# Patient Record
Sex: Female | Born: 1950 | Race: White | Hispanic: No | Marital: Single | Smoking: Never smoker
Health system: Southern US, Community
[De-identification: ages and names within clinical notes are randomized; demographics above are authoritative.]

## PROBLEM LIST (undated history)

## (undated) DIAGNOSIS — Z9981 Dependence on supplemental oxygen: Secondary | ICD-10-CM

## (undated) DIAGNOSIS — D649 Anemia, unspecified: Secondary | ICD-10-CM

## (undated) DIAGNOSIS — L03116 Cellulitis of left lower limb: Secondary | ICD-10-CM

## (undated) DIAGNOSIS — G4730 Sleep apnea, unspecified: Secondary | ICD-10-CM

## (undated) DIAGNOSIS — N189 Chronic kidney disease, unspecified: Secondary | ICD-10-CM

## (undated) DIAGNOSIS — I10 Essential (primary) hypertension: Secondary | ICD-10-CM

## (undated) DIAGNOSIS — M1990 Unspecified osteoarthritis, unspecified site: Secondary | ICD-10-CM

## (undated) DIAGNOSIS — E119 Type 2 diabetes mellitus without complications: Secondary | ICD-10-CM

## (undated) DIAGNOSIS — C4490 Unspecified malignant neoplasm of skin, unspecified: Secondary | ICD-10-CM

## (undated) DIAGNOSIS — T7840XA Allergy, unspecified, initial encounter: Secondary | ICD-10-CM

## (undated) DIAGNOSIS — I872 Venous insufficiency (chronic) (peripheral): Secondary | ICD-10-CM

## (undated) DIAGNOSIS — L02416 Cutaneous abscess of left lower limb: Secondary | ICD-10-CM

## (undated) HISTORY — DX: Cutaneous abscess of left lower limb: L02.416

## (undated) HISTORY — DX: Anemia, unspecified: D64.9

## (undated) HISTORY — DX: Cellulitis of left lower limb: L03.116

## (undated) HISTORY — PX: OTHER SURGICAL HISTORY: SHX169

## (undated) HISTORY — PX: NO PAST SURGERIES: SHX2092

---

## 2015-07-20 ENCOUNTER — Emergency Department: Payer: Medicare Other

## 2015-07-20 ENCOUNTER — Inpatient Hospital Stay
Admission: EM | Admit: 2015-07-20 | Discharge: 2015-07-27 | DRG: 641 | Disposition: A | Discharge status: Skilled Nursing Facility | Payer: Medicare Other | Attending: Internal Medicine | Admitting: Internal Medicine

## 2015-07-20 ENCOUNTER — Encounter: Payer: Self-pay | Admitting: Emergency Medicine

## 2015-07-20 DIAGNOSIS — E875 Hyperkalemia: Secondary | ICD-10-CM | POA: Diagnosis not present

## 2015-07-20 DIAGNOSIS — Z452 Encounter for adjustment and management of vascular access device: Secondary | ICD-10-CM

## 2015-07-20 DIAGNOSIS — M1990 Unspecified osteoarthritis, unspecified site: Secondary | ICD-10-CM | POA: Diagnosis present

## 2015-07-20 DIAGNOSIS — Z883 Allergy status to other anti-infective agents status: Secondary | ICD-10-CM

## 2015-07-20 DIAGNOSIS — R600 Localized edema: Secondary | ICD-10-CM | POA: Diagnosis present

## 2015-07-20 DIAGNOSIS — B354 Tinea corporis: Secondary | ICD-10-CM | POA: Diagnosis present

## 2015-07-20 DIAGNOSIS — L03116 Cellulitis of left lower limb: Secondary | ICD-10-CM | POA: Diagnosis present

## 2015-07-20 DIAGNOSIS — E1122 Type 2 diabetes mellitus with diabetic chronic kidney disease: Secondary | ICD-10-CM | POA: Diagnosis present

## 2015-07-20 DIAGNOSIS — R002 Palpitations: Secondary | ICD-10-CM | POA: Diagnosis not present

## 2015-07-20 DIAGNOSIS — R531 Weakness: Secondary | ICD-10-CM

## 2015-07-20 DIAGNOSIS — R079 Chest pain, unspecified: Secondary | ICD-10-CM | POA: Diagnosis not present

## 2015-07-20 DIAGNOSIS — Y92009 Unspecified place in unspecified non-institutional (private) residence as the place of occurrence of the external cause: Secondary | ICD-10-CM

## 2015-07-20 DIAGNOSIS — L03115 Cellulitis of right lower limb: Secondary | ICD-10-CM | POA: Diagnosis present

## 2015-07-20 DIAGNOSIS — Z7401 Bed confinement status: Secondary | ICD-10-CM

## 2015-07-20 DIAGNOSIS — Z6841 Body Mass Index (BMI) 40.0 and over, adult: Secondary | ICD-10-CM

## 2015-07-20 DIAGNOSIS — Z794 Long term (current) use of insulin: Secondary | ICD-10-CM

## 2015-07-20 DIAGNOSIS — T500X5A Adverse effect of mineralocorticoids and their antagonists, initial encounter: Secondary | ICD-10-CM | POA: Diagnosis present

## 2015-07-20 DIAGNOSIS — G8929 Other chronic pain: Secondary | ICD-10-CM | POA: Diagnosis present

## 2015-07-20 DIAGNOSIS — T464X5A Adverse effect of angiotensin-converting-enzyme inhibitors, initial encounter: Secondary | ICD-10-CM | POA: Diagnosis present

## 2015-07-20 DIAGNOSIS — Z885 Allergy status to narcotic agent status: Secondary | ICD-10-CM

## 2015-07-20 DIAGNOSIS — I272 Other secondary pulmonary hypertension: Secondary | ICD-10-CM | POA: Diagnosis present

## 2015-07-20 DIAGNOSIS — N183 Chronic kidney disease, stage 3 (moderate): Secondary | ICD-10-CM | POA: Diagnosis present

## 2015-07-20 DIAGNOSIS — R0602 Shortness of breath: Secondary | ICD-10-CM | POA: Diagnosis not present

## 2015-07-20 DIAGNOSIS — I10 Essential (primary) hypertension: Secondary | ICD-10-CM | POA: Diagnosis present

## 2015-07-20 DIAGNOSIS — I129 Hypertensive chronic kidney disease with stage 1 through stage 4 chronic kidney disease, or unspecified chronic kidney disease: Secondary | ICD-10-CM | POA: Diagnosis present

## 2015-07-20 HISTORY — DX: Essential (primary) hypertension: I10

## 2015-07-20 HISTORY — DX: Unspecified osteoarthritis, unspecified site: M19.90

## 2015-07-20 HISTORY — DX: Type 2 diabetes mellitus without complications: E11.9

## 2015-07-20 LAB — CBC WITH DIFFERENTIAL/PLATELET
Basophils Absolute: 0 10*3/uL (ref 0–0.1)
Basophils Relative: 1 %
Eosinophils Absolute: 0.2 10*3/uL (ref 0–0.7)
Eosinophils Relative: 2 %
HEMATOCRIT: 43.3 % (ref 35.0–47.0)
HEMOGLOBIN: 14 g/dL (ref 12.0–16.0)
LYMPHS ABS: 1 10*3/uL (ref 1.0–3.6)
Lymphocytes Relative: 13 %
MCH: 31.1 pg (ref 26.0–34.0)
MCHC: 32.3 g/dL (ref 32.0–36.0)
MCV: 96.3 fL (ref 80.0–100.0)
MONOS PCT: 7 %
Monocytes Absolute: 0.6 10*3/uL (ref 0.2–0.9)
NEUTROS ABS: 6 10*3/uL (ref 1.4–6.5)
NEUTROS PCT: 77 %
Platelets: 145 10*3/uL — ABNORMAL LOW (ref 150–440)
RBC: 4.5 MIL/uL (ref 3.80–5.20)
RDW: 14.4 % (ref 11.5–14.5)
WBC: 7.8 10*3/uL (ref 3.6–11.0)

## 2015-07-20 LAB — URINALYSIS COMPLETE WITH MICROSCOPIC (ARMC ONLY)
Bilirubin Urine: NEGATIVE
Glucose, UA: NEGATIVE mg/dL
HGB URINE DIPSTICK: NEGATIVE
Leukocytes, UA: NEGATIVE
NITRITE: NEGATIVE
PH: 5 (ref 5.0–8.0)
PROTEIN: 100 mg/dL — AB
Specific Gravity, Urine: 1.025 (ref 1.005–1.030)

## 2015-07-20 LAB — CK: Total CK: 108 U/L (ref 38–234)

## 2015-07-20 NOTE — ED Provider Notes (Signed)
[facility] Emergency Department Provider Note     ____________________________________________  Time seen: Seen upon arrival to the emergency department  I have reviewed the triage vital signs and the nursing notes.   HISTORY  Chief Complaint Weakness   HPI Gabrielle [name] is a 65 y.o. female with a history of arthritis as well as morbid obesity who is presenting for increased weakness. She says that ever since March she has been having episodes of weakness where she says that she feels like she cannot get up. She says that she has noticed increased edema in her bilateral lower extremities that is now going up to her abdomen. She says there is also erythema to her bilateral lower extremities which is now extending upward to the back of her bilateral thighs. She says that she has a lift recliner at home and usually gets around using a walker but has been unable to get out of the chair over the past week.She says that she has ongoing pain in her joints which is unchanged from her chronic arthritis pain. Does not report any fever. Does not report any burning with urination. Does not report any shortness of breath. Says that she lives alone and has not had her blood drawn since this past January. Denies any focal weakness. Says that she is also experienced paresthesias to different areas of her body including her back face and shoulders. Denies any paresthesias at this time.   Past Medical History  Diagnosis Date  . Arthritis   . Diabetes mellitus without complication ([place])   . Osteoarthritis   . Hypertension     There are no active problems to display for this patient.   History reviewed. No pertinent past surgical history.  Current Outpatient Rx  Name  Route  Sig  Dispense  Refill  . acetaminophen (TYLENOL) 500 MG tablet   Oral   Take 500 mg by mouth 3 (three) times daily.         . captopril (CAPOTEN) 50 MG tablet   Oral   Take 1 tablet by mouth 3  (three) times daily.         . cephALEXin (KEFLEX) 500 MG capsule   Oral   Take 1 capsule by mouth 2 (two) times daily.         . Cholecalciferol (VITAMIN D3) 1000 units CAPS   Oral   Take 2,000 Units by mouth daily.         . fluticasone (FLONASE) 50 MCG/ACT nasal spray   Each Nare   Place 1 spray into both nostrils at bedtime.         . insulin glargine (LANTUS) 100 UNIT/ML injection   Subcutaneous   Inject 70-80 Units into the skin daily.         . insulin lispro (HUMALOG) 100 UNIT/ML injection   Subcutaneous   Inject into the skin 3 (three) times daily with meals. Per sliding scale 1 units per 10 grams of carbs         . metoprolol succinate (TOPROL-XL) 100 MG 24 hr tablet   Oral   Take 1 tablet by mouth daily.         . nabumetone (RELAFEN) 500 MG tablet   Oral   Take 1 tablet by mouth 2 (two) times daily.         Marland Kitchen neomycin-bacitracin-polymyxin (NEOSPORIN) ointment   Topical   Apply 1 application topically as needed for wound care. apply to eye         .  nystatin (NYSTATIN) powder   Topical   Apply 1 g topically daily as needed.         Marland Kitchen spironolactone (ALDACTONE) 50 MG tablet   Oral   Take 1 tablet by mouth 2 (two) times daily.         . vitamin B-12 (CYANOCOBALAMIN) 1000 MCG tablet   Oral   Take 1,000 mcg by mouth daily.           Allergies Ultram; Erythromycin; and Tetracyclines & related  No family history on file.  Social History Social History  Substance Use Topics  . Smoking status: Never Smoker   . Smokeless tobacco: Never Used  . Alcohol Use: No    Review of Systems Constitutional: No fever/chills Eyes: No visual changes. ENT: No sore throat. Cardiovascular: Denies chest pain. Respiratory: Denies shortness of breath. Gastrointestinal: No abdominal pain.  No nausea, no vomiting.  No diarrhea.  No constipation. Genitourinary: Negative for dysuria. Musculoskeletal: Negative for back pain. Skin: Erythema to the  bilateral lower extremities. Neurological: Negative for headaches, focal weakness or numbness.  10-point ROS otherwise negative.  ____________________________________________   PHYSICAL EXAM:  VITAL SIGNS: ED Triage Vitals  Enc Vitals Group     BP 07/20/15 2204 173/82 mmHg     Pulse Rate 07/20/15 2204 79     Resp 07/20/15 2204 30     Temp 07/20/15 2207 98.1 F (36.7 C)     Temp Source 07/20/15 2207 Oral     SpO2 07/20/15 2204 92 %     Weight 07/20/15 2204 478 lb (216.819 kg)     Height 07/20/15 2204 5\' 6"  (1.676 m)     Head Cir --      Peak Flow --      Pain Score 07/20/15 2207 10     Pain Loc --      Pain Edu? --      Excl. in [place]? --     Constitutional: Alert and oriented. Morbidly obese. Eyes: Conjunctivae are normal. PERRL. EOMI. Head: Atraumatic. Nose: No congestion/rhinnorhea. Mouth/Throat: Mucous membranes are moist.  Oropharynx non-erythematous. Neck: No stridor.   Cardiovascular: Normal rate, regular rhythm. Grossly normal heart sounds.   Respiratory: Mildly tachypneic. Scant wheezing throughout. Gastrointestinal: Soft and nontender. Edema extending to the inferior abdomen. Musculoskeletal: Bilateral moderate edema to the lower extremities. There is erythema and stiffness to the skin consistent with chronic venous stasis extending from the bilateral feet up to the calves and the medial thighs. No tenderness to the thoracic or lumbar spines. Neurologic:  Normal speech and language. 4 out of 5 strength throughout. No saddle anesthesia. Skin:  See above musculoskeletal exam. Also with moist, slightly erythematous skin discoloration in the gluteal crest as well as under the right breast which is consistent with [place]. Psychiatric: Mood and affect are normal. Speech and behavior are normal.  ____________________________________________   LABS (all labs ordered are listed, but only abnormal results are displayed)  Labs Reviewed  CBC WITH DIFFERENTIAL/PLATELET -  Abnormal; Notable for the following:    Platelets 145 (*)    All other components within normal limits  URINALYSIS COMPLETEWITH MICROSCOPIC ([facility] ONLY) - Abnormal; Notable for the following:    Color, Urine YELLOW (*)    APPearance CLEAR (*)    Ketones, ur TRACE (*)    Protein, ur 100 (*)    Bacteria, UA RARE (*)    Squamous Epithelial / LPF 0-5 (*)    All other components within normal limits  CK  COMPREHENSIVE METABOLIC PANEL  TROPONIN I  TSH   ____________________________________________  EKG  ED ECG REPORT I, Doran Stabler, the attending physician, personally viewed and interpreted this ECG.   Date: 07/20/2015  EKG Time: 2204  Rate: 79  Rhythm: normal sinus rhythm  Axis: Normal  Intervals:Short PR interval.  ST&T Change: No ST segment elevation or depression. No abnormal T-wave inversions.  ____________________________________________  RADIOLOGY  DG Chest 1 View (Final result) Result time: 07/20/15 22:26:31   Final result by Rad Results In Interface (07/20/15 22:26:31)   Narrative:   CLINICAL DATA: Acute onset of worsening generalized weakness and immobility. Initial encounter.  EXAM: CHEST 1 VIEW  COMPARISON: None.  FINDINGS: The lungs are well-aerated. Vascular congestion is noted. Mildly increased interstitial markings may reflect minimal interstitial edema. There is no evidence of pleural effusion or pneumothorax.  The cardiomediastinal silhouette is mildly enlarged. No acute osseous abnormalities are seen.  IMPRESSION: Vascular congestion and mild cardiomegaly. Mildly increased interstitial markings may reflect minimal interstitial edema.   Electronically Signed By: Garald Balding M.D. On: 07/20/2015 22:26       ____________________________________________   PROCEDURES  ____________________________________________   INITIAL IMPRESSION / ASSESSMENT AND PLAN / ED COURSE  Pertinent labs & imaging results that were  available during my care of the patient were reviewed by me and considered in my medical decision making (see chart for details).  ----------------------------------------- 10:48 PM on 07/20/2015 -----------------------------------------  Family now at the bedside. Discussed case with family as well as workup.  ----------------------------------------- 11:58 PM on 07/20/2015 -----------------------------------------  Signed out to Dr. Beather Arbour. Pending lab work at this time. Patient will likely need admission versus placement. ____________________________________________   FINAL CLINICAL IMPRESSION(S) / ED DIAGNOSES  Weakness. Tinea corporis.    NEW MEDICATIONS STARTED DURING THIS VISIT:  New Prescriptions   No medications on file     Note:  This document was prepared using Dragon voice recognition software and may include unintentional dictation errors.    Orbie Pyo, MD 07/20/15 [phone]

## 2015-07-20 NOTE — ED Notes (Signed)
Pt in via EMS with complaints of episodes of increasing weakness and immobility since March.  Pt is wheelchair bound at home but was unable to transfer to wheelchair today.  Pt A/Ox4, vitals WDL, no immediate distress at this time.

## 2015-07-21 ENCOUNTER — Encounter: Payer: Self-pay | Admitting: Internal Medicine

## 2015-07-21 ENCOUNTER — Inpatient Hospital Stay: Payer: Medicare Other

## 2015-07-21 DIAGNOSIS — Z6841 Body Mass Index (BMI) 40.0 and over, adult: Secondary | ICD-10-CM | POA: Diagnosis not present

## 2015-07-21 DIAGNOSIS — I10 Essential (primary) hypertension: Secondary | ICD-10-CM | POA: Diagnosis present

## 2015-07-21 DIAGNOSIS — T500X5A Adverse effect of mineralocorticoids and their antagonists, initial encounter: Secondary | ICD-10-CM | POA: Diagnosis present

## 2015-07-21 DIAGNOSIS — R079 Chest pain, unspecified: Secondary | ICD-10-CM | POA: Diagnosis not present

## 2015-07-21 DIAGNOSIS — M1990 Unspecified osteoarthritis, unspecified site: Secondary | ICD-10-CM | POA: Diagnosis present

## 2015-07-21 DIAGNOSIS — R600 Localized edema: Secondary | ICD-10-CM | POA: Diagnosis present

## 2015-07-21 DIAGNOSIS — E875 Hyperkalemia: Secondary | ICD-10-CM | POA: Diagnosis present

## 2015-07-21 DIAGNOSIS — G8929 Other chronic pain: Secondary | ICD-10-CM | POA: Diagnosis present

## 2015-07-21 DIAGNOSIS — B354 Tinea corporis: Secondary | ICD-10-CM | POA: Diagnosis present

## 2015-07-21 DIAGNOSIS — R002 Palpitations: Secondary | ICD-10-CM | POA: Diagnosis not present

## 2015-07-21 DIAGNOSIS — Z885 Allergy status to narcotic agent status: Secondary | ICD-10-CM | POA: Diagnosis not present

## 2015-07-21 DIAGNOSIS — Z7401 Bed confinement status: Secondary | ICD-10-CM | POA: Diagnosis not present

## 2015-07-21 DIAGNOSIS — T464X5A Adverse effect of angiotensin-converting-enzyme inhibitors, initial encounter: Secondary | ICD-10-CM | POA: Diagnosis present

## 2015-07-21 DIAGNOSIS — R0602 Shortness of breath: Secondary | ICD-10-CM | POA: Diagnosis not present

## 2015-07-21 DIAGNOSIS — E1122 Type 2 diabetes mellitus with diabetic chronic kidney disease: Secondary | ICD-10-CM | POA: Diagnosis present

## 2015-07-21 DIAGNOSIS — Z883 Allergy status to other anti-infective agents status: Secondary | ICD-10-CM | POA: Diagnosis not present

## 2015-07-21 DIAGNOSIS — I129 Hypertensive chronic kidney disease with stage 1 through stage 4 chronic kidney disease, or unspecified chronic kidney disease: Secondary | ICD-10-CM | POA: Diagnosis present

## 2015-07-21 DIAGNOSIS — L03115 Cellulitis of right lower limb: Secondary | ICD-10-CM | POA: Diagnosis present

## 2015-07-21 DIAGNOSIS — N183 Chronic kidney disease, stage 3 (moderate): Secondary | ICD-10-CM | POA: Diagnosis present

## 2015-07-21 DIAGNOSIS — I272 Other secondary pulmonary hypertension: Secondary | ICD-10-CM | POA: Diagnosis present

## 2015-07-21 DIAGNOSIS — L03116 Cellulitis of left lower limb: Secondary | ICD-10-CM | POA: Diagnosis present

## 2015-07-21 DIAGNOSIS — Y92009 Unspecified place in unspecified non-institutional (private) residence as the place of occurrence of the external cause: Secondary | ICD-10-CM | POA: Diagnosis not present

## 2015-07-21 DIAGNOSIS — Z794 Long term (current) use of insulin: Secondary | ICD-10-CM | POA: Diagnosis not present

## 2015-07-21 LAB — CBC
HEMATOCRIT: 38.7 % (ref 35.0–47.0)
HEMOGLOBIN: 12.4 g/dL (ref 12.0–16.0)
MCH: 31.3 pg (ref 26.0–34.0)
MCHC: 31.9 g/dL — ABNORMAL LOW (ref 32.0–36.0)
MCV: 97.9 fL (ref 80.0–100.0)
Platelets: 140 10*3/uL — ABNORMAL LOW (ref 150–440)
RBC: 3.96 MIL/uL (ref 3.80–5.20)
RDW: 14.5 % (ref 11.5–14.5)
WBC: 9.1 10*3/uL (ref 3.6–11.0)

## 2015-07-21 LAB — PHOSPHORUS: Phosphorus: 4.2 mg/dL (ref 2.5–4.6)

## 2015-07-21 LAB — GLUCOSE, CAPILLARY
GLUCOSE-CAPILLARY: 157 mg/dL — AB (ref 65–99)
Glucose-Capillary: 108 mg/dL — ABNORMAL HIGH (ref 65–99)
Glucose-Capillary: 160 mg/dL — ABNORMAL HIGH (ref 65–99)
Glucose-Capillary: 91 mg/dL (ref 65–99)
Glucose-Capillary: 96 mg/dL (ref 65–99)

## 2015-07-21 LAB — COMPREHENSIVE METABOLIC PANEL
ALT: 18 U/L (ref 14–54)
ANION GAP: 5 (ref 5–15)
AST: 20 U/L (ref 15–41)
Albumin: 4.2 g/dL (ref 3.5–5.0)
Alkaline Phosphatase: 62 U/L (ref 38–126)
BUN: 26 mg/dL — ABNORMAL HIGH (ref 6–20)
CHLORIDE: 108 mmol/L (ref 101–111)
CO2: 24 mmol/L (ref 22–32)
CREATININE: 1.16 mg/dL — AB (ref 0.44–1.00)
Calcium: 9.4 mg/dL (ref 8.9–10.3)
GFR calc non Af Amer: 49 mL/min — ABNORMAL LOW (ref >60)
GFR, EST AFRICAN AMERICAN: 57 mL/min — AB (ref >60)
Glucose, Bld: 177 mg/dL — ABNORMAL HIGH (ref 65–99)
Potassium: >7.5 mmol/L (ref 3.5–5.1)
SODIUM: 137 mmol/L (ref 135–145)
Total Bilirubin: 1.3 mg/dL — ABNORMAL HIGH (ref 0.3–1.2)
Total Protein: 8 g/dL (ref 6.5–8.1)

## 2015-07-21 LAB — BASIC METABOLIC PANEL
ANION GAP: 3 — AB (ref 5–15)
Anion gap: 6 (ref 5–15)
BUN: 25 mg/dL — ABNORMAL HIGH (ref 6–20)
BUN: 22 mg/dL — AB (ref 6–20)
CALCIUM: 9 mg/dL (ref 8.9–10.3)
CALCIUM: 9.4 mg/dL (ref 8.9–10.3)
CHLORIDE: 111 mmol/L (ref 101–111)
CHLORIDE: 111 mmol/L (ref 101–111)
CO2: 26 mmol/L (ref 22–32)
CO2: 25 mmol/L (ref 22–32)
CREATININE: 1.13 mg/dL — AB (ref 0.44–1.00)
Creatinine, Ser: 1.14 mg/dL — ABNORMAL HIGH (ref 0.44–1.00)
GFR calc Af Amer: 59 mL/min — ABNORMAL LOW (ref >60)
GFR calc non Af Amer: 50 mL/min — ABNORMAL LOW (ref >60)
GFR calc non Af Amer: 51 mL/min — ABNORMAL LOW (ref >60)
GFR, EST AFRICAN AMERICAN: 58 mL/min — AB (ref >60)
GLUCOSE: 108 mg/dL — AB (ref 65–99)
Glucose, Bld: 108 mg/dL — ABNORMAL HIGH (ref 65–99)
Potassium: 7.4 mmol/L (ref 3.5–5.1)
Potassium: 7.2 mmol/L (ref 3.5–5.1)
Sodium: 140 mmol/L (ref 135–145)
Sodium: 142 mmol/L (ref 135–145)

## 2015-07-21 LAB — TSH: TSH: 6.592 u[IU]/mL — AB (ref 0.350–4.500)

## 2015-07-21 LAB — POTASSIUM: POTASSIUM: 7.5 mmol/L — AB (ref 3.5–5.1)

## 2015-07-21 LAB — TROPONIN I: Troponin I: <0.03 ng/mL (ref <0.031)

## 2015-07-21 MED ORDER — SODIUM CHLORIDE 0.9 % IV SOLN: 100.0000 mL | INTRAVENOUS | Status: DC | PRN

## 2015-07-21 MED ORDER — HYDRALAZINE HCL 20 MG/ML IJ SOLN
10.0000 mg | INTRAMUSCULAR | Status: DC | PRN
  Administered 2015-07-26: 10 mg via INTRAVENOUS
  Filled 2015-07-21: qty 1

## 2015-07-21 MED ORDER — VITAMIN B-12 1000 MCG PO TABS
1000.0000 ug | ORAL_TABLET | Freq: Every day | ORAL | Status: DC
  Administered 2015-07-21 – 2015-07-27 (×7): 1000 ug via ORAL
  Filled 2015-07-21 (×7): qty 1

## 2015-07-21 MED ORDER — LIDOCAINE-PRILOCAINE 2.5-2.5 % EX CREA
1.0000 "application " | TOPICAL_CREAM | CUTANEOUS | Status: DC | PRN
  Filled 2015-07-21: qty 5

## 2015-07-21 MED ORDER — HEPARIN SODIUM (PORCINE) 1000 UNIT/ML DIALYSIS
1000.0000 [IU] | INTRAMUSCULAR | Status: DC | PRN
  Filled 2015-07-21: qty 1

## 2015-07-21 MED ORDER — INSULIN GLARGINE 100 UNIT/ML ~~LOC~~ SOLN
55.0000 [IU] | Freq: Every day | SUBCUTANEOUS | Status: DC
  Filled 2015-07-21 (×3): qty 0.55

## 2015-07-21 MED ORDER — CALCIUM GLUCONATE 10 % IV SOLN
INTRAVENOUS | Status: AC
  Filled 2015-07-21: qty 10

## 2015-07-21 MED ORDER — PATIROMER SORBITEX CALCIUM 8.4 G PO PACK
25.2000 g | PACK | Freq: Every day | ORAL | Status: DC
  Administered 2015-07-21 – 2015-07-23 (×2): 25.2 g via ORAL
  Filled 2015-07-21 (×4): qty 4

## 2015-07-21 MED ORDER — INSULIN GLARGINE 100 UNIT/ML ~~LOC~~ SOLN: 70.0000 [IU] | Freq: Every day | SUBCUTANEOUS | Status: DC

## 2015-07-21 MED ORDER — SODIUM CHLORIDE 0.9% FLUSH: 3.0000 mL | INTRAVENOUS | Status: DC | PRN

## 2015-07-21 MED ORDER — SODIUM CHLORIDE 0.9% FLUSH
3.0000 mL | Freq: Two times a day (BID) | INTRAVENOUS | Status: DC
  Administered 2015-07-21 – 2015-07-26 (×12): 3 mL via INTRAVENOUS

## 2015-07-21 MED ORDER — ENOXAPARIN SODIUM 40 MG/0.4ML ~~LOC~~ SOLN
40.0000 mg | Freq: Two times a day (BID) | SUBCUTANEOUS | Status: DC
  Administered 2015-07-22 – 2015-07-27 (×11): 40 mg via SUBCUTANEOUS
  Filled 2015-07-21 (×12): qty 0.4

## 2015-07-21 MED ORDER — SODIUM POLYSTYRENE SULFONATE 15 GM/60ML PO SUSP
30.0000 g | Freq: Once | ORAL | Status: AC
  Administered 2015-07-21: 30 g via ORAL
  Filled 2015-07-21: qty 120

## 2015-07-21 MED ORDER — CALCIUM GLUCONATE 10 % IV SOLN
1.0000 g | Freq: Once | INTRAVENOUS | Status: AC
  Administered 2015-07-21: 1 g via INTRAVENOUS
  Filled 2015-07-21: qty 10

## 2015-07-21 MED ORDER — METOPROLOL SUCCINATE ER 100 MG PO TB24
100.0000 mg | ORAL_TABLET | Freq: Every day | ORAL | Status: DC
  Administered 2015-07-21 – 2015-07-27 (×6): 100 mg via ORAL
  Filled 2015-07-21 (×7): qty 1

## 2015-07-21 MED ORDER — INSULIN GLARGINE 100 UNIT/ML ~~LOC~~ SOLN
70.0000 [IU] | Freq: Every day | SUBCUTANEOUS | Status: DC
  Administered 2015-07-21: 70 [IU] via SUBCUTANEOUS
  Filled 2015-07-21: qty 0.7

## 2015-07-21 MED ORDER — PENTAFLUOROPROP-TETRAFLUOROETH EX AERO
1.0000 "application " | INHALATION_SPRAY | CUTANEOUS | Status: DC | PRN
  Filled 2015-07-21: qty 30

## 2015-07-21 MED ORDER — BACITRACIN-NEOMYCIN-POLYMYXIN 400-5-5000 EX OINT: 1.0000 "application " | TOPICAL_OINTMENT | CUTANEOUS | Status: DC | PRN

## 2015-07-21 MED ORDER — ONDANSETRON HCL 4 MG PO TABS: 4.0000 mg | ORAL_TABLET | Freq: Four times a day (QID) | ORAL | Status: DC | PRN

## 2015-07-21 MED ORDER — ALTEPLASE 2 MG IJ SOLR: 2.0000 mg | Freq: Once | INTRAMUSCULAR | Status: DC | PRN

## 2015-07-21 MED ORDER — ACETAMINOPHEN 325 MG PO TABS
650.0000 mg | ORAL_TABLET | Freq: Four times a day (QID) | ORAL | Status: DC | PRN
  Administered 2015-07-23 (×3): 650 mg via ORAL
  Filled 2015-07-21 (×3): qty 2

## 2015-07-21 MED ORDER — FLUTICASONE PROPIONATE 50 MCG/ACT NA SUSP
1.0000 | Freq: Every day | NASAL | Status: DC
  Administered 2015-07-22 (×2): 1 via NASAL
  Filled 2015-07-21: qty 16

## 2015-07-21 MED ORDER — SODIUM CHLORIDE 0.9 % IV SOLN: 250.0000 mL | INTRAVENOUS | Status: DC | PRN

## 2015-07-21 MED ORDER — SODIUM BICARBONATE 8.4 % IV SOLN
50.0000 meq | Freq: Once | INTRAVENOUS | Status: AC
  Administered 2015-07-21: 50 meq via INTRAVENOUS
  Filled 2015-07-21: qty 50

## 2015-07-21 MED ORDER — ONDANSETRON HCL 4 MG/2ML IJ SOLN: 4.0000 mg | Freq: Four times a day (QID) | INTRAMUSCULAR | Status: DC | PRN

## 2015-07-21 MED ORDER — ENOXAPARIN SODIUM 40 MG/0.4ML ~~LOC~~ SOLN: 40.0000 mg | SUBCUTANEOUS | Status: DC

## 2015-07-21 MED ORDER — SODIUM CHLORIDE 0.9% FLUSH
3.0000 mL | Freq: Two times a day (BID) | INTRAVENOUS | Status: DC
  Administered 2015-07-21 – 2015-07-25 (×5): 3 mL via INTRAVENOUS

## 2015-07-21 MED ORDER — INSULIN ASPART 100 UNIT/ML ~~LOC~~ SOLN
0.0000 [IU] | Freq: Three times a day (TID) | SUBCUTANEOUS | Status: DC
  Administered 2015-07-23 (×2): 4 [IU] via SUBCUTANEOUS
  Administered 2015-07-23 – 2015-07-24 (×2): 3 [IU] via SUBCUTANEOUS
  Administered 2015-07-24: 4 [IU] via SUBCUTANEOUS
  Administered 2015-07-25: 2 [IU] via SUBCUTANEOUS
  Administered 2015-07-25: 4 [IU] via SUBCUTANEOUS
  Administered 2015-07-26: 3 [IU] via SUBCUTANEOUS
  Administered 2015-07-26 – 2015-07-27 (×3): 4 [IU] via SUBCUTANEOUS
  Administered 2015-07-27: 7 [IU] via SUBCUTANEOUS
  Administered 2015-07-27: 4 [IU] via SUBCUTANEOUS
  Filled 2015-07-21 (×3): qty 4
  Filled 2015-07-21: qty 3
  Filled 2015-07-21: qty 4
  Filled 2015-07-21: qty 3
  Filled 2015-07-21 (×3): qty 4
  Filled 2015-07-21: qty 3
  Filled 2015-07-21 (×3): qty 4
  Filled 2015-07-21: qty 3
  Filled 2015-07-21: qty 7

## 2015-07-21 MED ORDER — VITAMIN D 1000 UNITS PO TABS
2000.0000 [IU] | ORAL_TABLET | Freq: Every day | ORAL | Status: DC
  Administered 2015-07-21 – 2015-07-27 (×7): 2000 [IU] via ORAL
  Filled 2015-07-21 (×7): qty 2

## 2015-07-21 MED ORDER — INSULIN ASPART 100 UNIT/ML ~~LOC~~ SOLN
10.0000 [IU] | Freq: Once | SUBCUTANEOUS | Status: AC
  Administered 2015-07-21: 10 [IU] via SUBCUTANEOUS
  Filled 2015-07-21: qty 10

## 2015-07-21 MED ORDER — ACETAMINOPHEN 650 MG RE SUPP: 650.0000 mg | Freq: Four times a day (QID) | RECTAL | Status: DC | PRN

## 2015-07-21 MED ORDER — INSULIN ASPART 100 UNIT/ML ~~LOC~~ SOLN: 0.0000 [IU] | Freq: Every day | SUBCUTANEOUS | Status: DC

## 2015-07-21 MED ORDER — DEXTROSE 50 % IV SOLN
1.0000 | Freq: Once | INTRAVENOUS | Status: AC
  Administered 2015-07-21: 50 mL via INTRAVENOUS
  Filled 2015-07-21 (×2): qty 50

## 2015-07-21 MED ORDER — LIDOCAINE HCL (PF) 1 % IJ SOLN
5.0000 mL | INTRAMUSCULAR | Status: DC | PRN
  Filled 2015-07-21: qty 5

## 2015-07-21 MED ORDER — SODIUM CHLORIDE 0.9 % IV SOLN
1.0000 g | Freq: Once | INTRAVENOUS | Status: AC
  Administered 2015-07-21: 1 g via INTRAVENOUS

## 2015-07-21 NOTE — ED Notes (Signed)
Pt changed and cleaned after BM with the help of Sarah, EDT and this RN. Pt position readjusted after cleaning. Pt verbalized position more comfortable.

## 2015-07-21 NOTE — Progress Notes (Signed)
Post hd tx 

## 2015-07-21 NOTE — Progress Notes (Signed)
Vascular team at bedside to place  hemodialysis catheter

## 2015-07-21 NOTE — ED Notes (Signed)
Pharmacy called to send dextrose 50% due to not in pixis

## 2015-07-21 NOTE — Progress Notes (Signed)
Hemodialysis complted.

## 2015-07-21 NOTE — Op Note (Signed)
  OPERATIVE NOTE   PROCEDURE: 1. Insertion of temporary dialysis catheter catheter right IJ approach.  PRE-OPERATIVE DIAGNOSIS: Malignant hyperkalemia  POST-OPERATIVE DIAGNOSIS: Same  SURGEON: Katha Cabal M.D.  ANESTHESIA: 1% lidocaine local infiltration  ESTIMATED BLOOD LOSS: Minimal cc  INDICATIONS:   Khalis Lasane is a 65 y.o. female who presents with found hyperkalemia that has been unresponsive to medical interventions she therefore requires emergent dialysis to prevent lethal arrhythmia.  DESCRIPTION: After obtaining full informed written consent, the patient was positioned supine. The right neck was prepped and draped in a sterile fashion. Ultrasound was placed in a sterile sleeve. Ultrasound was utilized to identify the right internal jugular vein which is noted to be echolucent and compressible indicating patency. Images recorded for the permanent record. Under real-time visualization a Seldinger needle is inserted into the vein and the guidewires advanced without difficulty. Small counterincision was made at the wire insertion site. Dilator is passed over the wire and the temporary dialysis catheter catheter is fed over the wire without difficulty.  All lumens aspirate and flush easily and are packed with heparin saline. Catheter secured to the skin of the right neck with 2-0 silk. A sterile dressing is applied with Biopatch.  COMPLICATIONS: None  CONDITION: Good  Katha Cabal, M.D. [place] [facility]. Office:  [phone]

## 2015-07-21 NOTE — Care Management (Addendum)
Patient presents from home  for weakness.  At baseline, patient lives alone and is mostly confined to motorized wheelchair.  her son who lives in [place] has made sure patient's home has been adapted to meet patient's needs.  She is morbidly obese, has used powered wheelchair for the past 13 years, has "every piece of equipment and grab bars imaginable" to allow patient to stay in her own home.  She has medic alert.  Patient has not left her home but twice in the last 4 years.  She has family and friends that come to visit her in the home.  Her sister delivers her groceries. patient is not able to "really cook" due to pain in shoulders so her meals consist of things that are very easy to fix. She does have chronic pain issues not only in shoulder but in her back and knees.  Is followed by Alvester Chou in the home but Almyra Free is having some health issues and has not seen patient since January.  At baseline, patient is able to transfer on her own but she has a marked increase in weakness over the last 3 days.  Admitted with potassium of > 7.  Nephrology consult is pending.  Patient is agreeable to consider short term skilled nursing placement if it is recommended because she must be able to stand and povit on her own.  She is also agreeable to home health.  Patient is at baseline needing assist with her personal care at home.  She verbalizes understanding that her medicare does not cover this service.  Provided list of agencies providing in home care.  Reaching out to patient's pcp to determine if there is going to be a need to find another physician to make home visits.  Provided contact information for [facility] - if finds needs another physician.  Patient will require physical therapy and occupational consults when medically stable

## 2015-07-21 NOTE — Progress Notes (Signed)
Critical lab result, potassium 7.2, MD aware, bariatric bed order placed as per md order.

## 2015-07-21 NOTE — ED Notes (Signed)
Hospitalist at bedside 

## 2015-07-21 NOTE — Consult Note (Signed)
[facility] CONSULT NOTE    Date: 07/21/2015                  Patient Name:  Miriya Ruggiero  MRN: [id number]  DOB: 1950-08-29  Age / Sex: 65 y.o., female         PCP: Alvester Chou, NP                 Service Requesting Consult: Dr. Earleen Newport                 Reason for Consult: Severe hyperkalemia            History of Present Illness: Patient is a 65 y.o. female with a PMHx of osteoarthritis, diabetes mellitus type 2, hypertension, severe morbid obesity, who was admitted to [facility] on 07/20/2015 for evaluation of weakness.  The patient has been home bound for approximately 2 years secondary to her morbid obesity and severe lower extremity edema.  However she is able to transfer out of the bedand able to ambulate with a walker.  When she presented here she had a potassium greater than 7.5.  She is followed by a provider that sees her in her home.  She reports that she last had blood workin January.  At that point in time she states that potassium was slightly high.  She is been given Kayexalate x2 and has also been started on patiromer. Despite this potassium is only down to 7.2 today.  She is noted to be on captopril as well as spironolactone at home.    Medications: Outpatient medications: Prescriptions prior to admission  Medication Sig Dispense Refill Last Dose  . acetaminophen (TYLENOL) 500 MG tablet Take 500 mg by mouth 3 (three) times daily.   07/19/2015 at Unknown time  . captopril (CAPOTEN) 50 MG tablet Take 1 tablet by mouth 3 (three) times daily.   07/20/2015 at Unknown time  . cephALEXin (KEFLEX) 500 MG capsule Take 1 capsule by mouth 2 (two) times daily.   07/19/2015 at Unknown time  . Cholecalciferol (VITAMIN D3) 1000 units CAPS Take 2,000 Units by mouth daily.   07/19/2015 at Unknown time  . fluticasone (FLONASE) 50 MCG/ACT nasal spray Place 1 spray into both nostrils at bedtime.   07/19/2015 at Unknown time  . insulin glargine (LANTUS) 100 UNIT/ML injection  Inject 70-80 Units into the skin daily.   07/20/2015 at Unknown time  . insulin lispro (HUMALOG) 100 UNIT/ML injection Inject into the skin 3 (three) times daily with meals. Per sliding scale 1 units per 10 grams of carbs   07/20/2015 at Unknown time  . metoprolol succinate (TOPROL-XL) 100 MG 24 hr tablet Take 1 tablet by mouth daily.   07/19/2015 at Unknown time  . nabumetone (RELAFEN) 500 MG tablet Take 1 tablet by mouth 2 (two) times daily.   07/19/2015 at Unknown time  . neomycin-bacitracin-polymyxin (NEOSPORIN) ointment Apply 1 application topically as needed for wound care. apply to eye   prn at prn  . nystatin (NYSTATIN) powder Apply 1 g topically daily as needed.   prn at prn  . spironolactone (ALDACTONE) 50 MG tablet Take 1 tablet by mouth 2 (two) times daily.   07/20/2015 at Unknown time  . vitamin B-12 (CYANOCOBALAMIN) 1000 MCG tablet Take 1,000 mcg by mouth daily.   07/19/2015 at Unknown time    Current medications: Current Facility-Administered Medications  Medication Dose Route Frequency Provider Last Rate Last Dose  . 0.9 %  sodium chloride infusion  250 mL Intravenous PRN Saundra Shelling, MD      . acetaminophen (TYLENOL) tablet 650 mg  650 mg Oral Q6H PRN Saundra Shelling, MD       Or  . acetaminophen (TYLENOL) suppository 650 mg  650 mg Rectal Q6H PRN Saundra Shelling, MD      . cholecalciferol (VITAMIN D) tablet 2,000 Units  2,000 Units Oral Daily Saundra Shelling, MD   2,000 Units at 07/21/15 1005  . enoxaparin (LOVENOX) injection 40 mg  40 mg Subcutaneous Q12H Crystal G Scarpena, RPH      . fluticasone (FLONASE) 50 MCG/ACT nasal spray 1 spray  1 spray Each Nare QHS Pavan Pyreddy, MD      . hydrALAZINE (APRESOLINE) injection 10 mg  10 mg Intravenous Q4H PRN Pavan Pyreddy, MD      . insulin aspart (novoLOG) injection 0-20 Units  0-20 Units Subcutaneous TID WC Saundra Shelling, MD   0 Units at 07/21/15 0818  . insulin aspart (novoLOG) injection 0-5 Units  0-5 Units Subcutaneous QHS Saundra Shelling, MD      . Derrill Memo ON 07/22/2015] insulin glargine (LANTUS) injection 55 Units  55 Units Subcutaneous Daily Loletha Grayer, MD      . metoprolol succinate (TOPROL-XL) 24 hr tablet 100 mg  100 mg Oral Daily Pavan Pyreddy, MD   100 mg at 07/21/15 1005  . neomycin-bacitracin-polymyxin (NEOSPORIN) ointment 1 application  1 application Topical PRN Pavan Pyreddy, MD      . ondansetron (ZOFRAN) tablet 4 mg  4 mg Oral Q6H PRN Saundra Shelling, MD       Or  . ondansetron (ZOFRAN) injection 4 mg  4 mg Intravenous Q6H PRN Saundra Shelling, MD      . patiromer Daryll Drown) packet 25.2 g  25.2 g Oral Daily Duriel Deery, MD   25.2 g at 07/21/15 1115  . sodium chloride flush (NS) 0.9 % injection 3 mL  3 mL Intravenous Q12H Pavan Pyreddy, MD   3 mL at 07/21/15 1006  . sodium chloride flush (NS) 0.9 % injection 3 mL  3 mL Intravenous Q12H Pavan Pyreddy, MD   3 mL at 07/21/15 1006  . sodium chloride flush (NS) 0.9 % injection 3 mL  3 mL Intravenous PRN Pavan Pyreddy, MD      . vitamin B-12 (CYANOCOBALAMIN) tablet 1,000 mcg  1,000 mcg Oral Daily Saundra Shelling, MD   1,000 mcg at 07/21/15 1005      Allergies: Allergies  Allergen Reactions  . Ultram [Tramadol] Other (See Comments)    Pt reports, "makes my head feel funny."  . Erythromycin Rash  . Tetracyclines & Related Rash      Past Medical History: Past Medical History  Diagnosis Date  . Arthritis   . Diabetes mellitus without complication ([place])   . Osteoarthritis   . Hypertension      Past Surgical History: Past Surgical History  Procedure Laterality Date  . None       Family History: Family History  Problem Relation Age of Onset  . Diabetes Mellitus II Father      Social History: Social History   Social History  . Marital Status: Single    Spouse Name: N/A  . Number of Children: N/A  . Years of Education: N/A   Occupational History  . disabled    Social History Main Topics  . Smoking status: Never Smoker   . Smokeless  tobacco: Never Used  . Alcohol Use: No  . Drug Use: No  .  Sexual Activity: No   Other Topics Concern  . Not on file   Social History Narrative     Review of Systems: Review of Systems  Constitutional: Positive for malaise/fatigue. Negative for fever, chills and weight loss.  HENT: Negative for hearing loss and tinnitus.   Eyes: Negative for blurred vision, double vision and photophobia.  Respiratory: Negative for cough, hemoptysis and sputum production.   Cardiovascular: Positive for leg swelling. Negative for chest pain, palpitations and orthopnea.  Gastrointestinal: Negative for heartburn, nausea, vomiting and diarrhea.  Genitourinary: Negative for dysuria and urgency.  Musculoskeletal: Negative for myalgias and neck pain.  Neurological: Positive for weakness. Negative for dizziness, focal weakness and headaches.  Endo/Heme/Allergies: Negative for polydipsia. Does not bruise/bleed easily.  Psychiatric/Behavioral: Negative for depression and hallucinations.     Vital Signs: Blood pressure 112/95, pulse 81, temperature 98.5 F (36.9 C), temperature source Oral, resp. rate 20, height 5\' 6"  (1.676 m), weight 216.819 kg (478 lb), SpO2 98 %.  Weight trends: Filed Weights   07/20/15 2204  Weight: 216.819 kg (478 lb)    Physical Exam: General: Morbidly obese female, NAD  Head: Normocephalic, atraumatic.  Eyes: Anicteric, EOMI  Nose: Mucous membranes moist, not inflammed, nonerythematous.  Throat: Oropharynx nonerythematous, no exudate appreciated.   Neck: Supple, trachea midline.  Lungs:  Normal respiratory effort. Clear to auscultation BL without crackles or wheezes.  Heart: RRR. S1 and S2 normal without gallop, murmur, or rubs.  Abdomen:  Large pannus noted, BS present, no rebound  Extremities: 3+ b/l LE edema, b/l LE cellulitis noted  Neurologic: A&O X3, Motor strength is 5/5 in the all 4 extremities  Skin: No visible rashes, scars.    Lab results: Basic Metabolic  Panel:  Recent Labs Lab 07/20/15 2256 07/21/15 0543 07/21/15 1225  NA 137 140 142  K >7.5* 7.4* 7.2*  CL 108 111 111  CO2 24 26 25   GLUCOSE 177* 108* 108*  BUN 26* 25* 22*  CREATININE 1.16* 1.14* 1.13*  CALCIUM 9.4 9.0 9.4    Liver Function Tests:  Recent Labs Lab 07/20/15 2256  AST 20  ALT 18  ALKPHOS 62  BILITOT 1.3*  PROT 8.0  ALBUMIN 4.2   No results for input(s): LIPASE, AMYLASE in the last 168 hours. No results for input(s): AMMONIA in the last 168 hours.  CBC:  Recent Labs Lab 07/20/15 2256 07/21/15 0543  WBC 7.8 9.1  NEUTROABS 6.0  --   HGB 14.0 12.4  HCT 43.3 38.7  MCV 96.3 97.9  PLT 145* 140*    Cardiac Enzymes:  Recent Labs Lab 07/20/15 2256  CKTOTAL 108  TROPONINI <0.03    BNP: Invalid input(s): POCBNP  CBG:  Recent Labs Lab 07/21/15 0051 07/21/15 0359 07/21/15 0744 07/21/15 1123  GLUCAP 160* 157* 96 108*    Microbiology: No results found for this or any previous visit.  Coagulation Studies: No results for input(s): LABPROT, INR in the last 72 hours.  Urinalysis:  Recent Labs  07/20/15 2244  COLORURINE YELLOW*  LABSPEC 1.025  PHURINE 5.0  GLUCOSEU NEGATIVE  HGBUR NEGATIVE  BILIRUBINUR NEGATIVE  KETONESUR TRACE*  PROTEINUR 100*  NITRITE NEGATIVE  LEUKOCYTESUR NEGATIVE      Imaging: Dg Chest 1 View  07/20/2015  CLINICAL DATA:  Acute onset of worsening generalized weakness and immobility. Initial encounter. EXAM: CHEST 1 VIEW COMPARISON:  None. FINDINGS: The lungs are well-aerated. Vascular congestion is noted. Mildly increased interstitial markings may reflect minimal interstitial edema. There is no evidence  of pleural effusion or pneumothorax. The cardiomediastinal silhouette is mildly enlarged. No acute osseous abnormalities are seen. IMPRESSION: Vascular congestion and mild cardiomegaly. Mildly increased interstitial markings may reflect minimal interstitial edema. Electronically Signed   By: Garald Balding  M.D.   On: 07/20/2015 22:26      Assessment & Plan: Pt is a 65 y.o. female with a PMHx of osteoarthritis, diabetes mellitus type 2, hypertension, severe morbid obesity, who was admitted to [facility] on 07/20/2015 for evaluation of weakness.   1.  Severe hyperkalemia secondary to spironolactone and captopril. 2.  Bilateral lower extremity edema with cellulitis. 3.  Hypertension.  Plan: The patient presents with severe hyperkalemia.  This is most likely attributable toconcomitant use of captopril and spironolactone.  Hyperkalemia is likely chronic in nature however.  Given severity of hyperkalemia and only minimal response totherapy and we will proceed with a short course of renal replacement therapy. We will continue the patient on patiromer at maximum dosage for now.  Agree with discontinuation of captopril as well as spironolactone.  Further plan as patient progresses.

## 2015-07-21 NOTE — Progress Notes (Signed)
Pre-hd tx 

## 2015-07-21 NOTE — Progress Notes (Signed)
Hemodialysis tx start 

## 2015-07-21 NOTE — Progress Notes (Signed)
Critical lab result potassium 7.4, Dr Leslye Peer notified.

## 2015-07-21 NOTE — ED Notes (Signed)
Pt called out stating she had a BM. Pt turned, cleaned. Pt re-positioned in bed.

## 2015-07-21 NOTE — Progress Notes (Signed)
Patient ID: Gabrielle [name], female   DOB: 1950-08-02, 65 y.o.   MRN: [id number] [facility] PROGRESS NOTE  Kaylee Trivett PYP:[id number] DOB: Feb 14, 1950 DOA: 07/20/2015 PCP: Alvester Chou, NP  HPI/Subjective: Patient felt very weak upon coming into the hospital. She's been home bound for the last many years. She is able to maneuver in her house but recently could hardly even do that. She had an episode of diarrhea at home. She was brought in and had a severely high potassium. She's had numerous episodes of diarrhea.  Objective: Filed Vitals:   07/21/15 1002 07/21/15 1121  BP: 135/48 112/95  Pulse: 81 81  Temp: 98.3 F (36.8 C) 98.5 F (36.9 C)  Resp: 18 20    Filed Weights   07/20/15 2204  Weight: 216.819 kg (478 lb)    ROS: Review of Systems  Constitutional: Negative for fever and chills.  Eyes: Negative for blurred vision.  Respiratory: Negative for cough and shortness of breath.   Cardiovascular: Negative for chest pain.  Gastrointestinal: Positive for diarrhea. Negative for nausea, vomiting, abdominal pain and constipation.  Genitourinary: Negative for dysuria.  Musculoskeletal: Negative for joint pain.  Neurological: Positive for weakness. Negative for dizziness and headaches.   Exam: Physical Exam  Constitutional: She is oriented to person, place, and time.  HENT:  Nose: No mucosal edema.  Mouth/Throat: No oropharyngeal exudate or posterior oropharyngeal edema.  Eyes: Conjunctivae, EOM and lids are normal. Pupils are equal, round, and reactive to light.  Neck: No JVD present. Carotid bruit is not present. No edema present. No thyroid mass and no thyromegaly present.  Cardiovascular: S1 normal and S2 normal.  Exam reveals no gallop.   No murmur heard. Respiratory: No respiratory distress. She has no wheezes. She has no rhonchi. She has no rales.  GI: Soft. Bowel sounds are normal. There is no tenderness.  Musculoskeletal:       Right ankle: She exhibits swelling.        Left ankle: She exhibits swelling.  Lymphadenopathy:    She has no cervical adenopathy.  Neurological: She is alert and oriented to person, place, and time. No cranial nerve deficit.  Skin: Skin is warm. Nails show no clubbing.   Chronic erythema of bilateral lower extremities patient states that she takes suppressive Keflex at home  Psychiatric: She has a normal mood and affect.      Data Reviewed: Basic Metabolic Panel:  Recent Labs Lab 07/20/15 0100 07/20/15 2256 07/21/15 0543  NA  --  137 140  K 7.5* >7.5* 7.4*  CL  --  108 111  CO2  --  24 26  GLUCOSE  --  177* 108*  BUN  --  26* 25*  CREATININE  --  1.16* 1.14*  CALCIUM  --  9.4 9.0   Liver Function Tests:  Recent Labs Lab 07/20/15 2256  AST 20  ALT 18  ALKPHOS 62  BILITOT 1.3*  PROT 8.0  ALBUMIN 4.2   CBC:  Recent Labs Lab 07/20/15 2256 07/21/15 0543  WBC 7.8 9.1  NEUTROABS 6.0  --   HGB 14.0 12.4  HCT 43.3 38.7  MCV 96.3 97.9  PLT 145* 140*   Cardiac Enzymes:  Recent Labs Lab 07/20/15 2256  CKTOTAL 108  TROPONINI <0.03    CBG:  Recent Labs Lab 07/21/15 0051 07/21/15 0359 07/21/15 0744 07/21/15 1123  GLUCAP 160* 157* 96 108*     Studies: Dg Chest 1 View  07/20/2015  CLINICAL DATA:  Acute onset of  worsening generalized weakness and immobility. Initial encounter. EXAM: CHEST 1 VIEW COMPARISON:  None. FINDINGS: The lungs are well-aerated. Vascular congestion is noted. Mildly increased interstitial markings may reflect minimal interstitial edema. There is no evidence of pleural effusion or pneumothorax. The cardiomediastinal silhouette is mildly enlarged. No acute osseous abnormalities are seen. IMPRESSION: Vascular congestion and mild cardiomegaly. Mildly increased interstitial markings may reflect minimal interstitial edema. Electronically Signed   By: Garald Balding M.D.   On: 07/20/2015 22:26    Scheduled Meds: . cholecalciferol  2,000 Units Oral Daily  . enoxaparin  (LOVENOX) injection  40 mg Subcutaneous Q12H  . fluticasone  1 spray Each Nare QHS  . insulin aspart  0-20 Units Subcutaneous TID WC  . insulin aspart  0-5 Units Subcutaneous QHS  . insulin glargine  70 Units Subcutaneous Daily  . metoprolol succinate  100 mg Oral Daily  . patiromer  25.2 g Oral Daily  . sodium chloride flush  3 mL Intravenous Q12H  . sodium chloride flush  3 mL Intravenous Q12H  . vitamin B-12  1,000 mcg Oral Daily    Assessment/Plan:  1. Life threatening hyperkalemia. Potassium last night greater than 7.5. Patient was given medications to treat this and bring this down  Including Kayexalate. Repeat potassium this morning 7.4. I gave a another dose of Kayexalate and IV calcium. Early afternoon potassium 7.2. Case discussed with Dr. Holley Raring nephrology to do dialysis today. Risk factor is spironolactone and captopril. Nephrology also ordered [place] 2.  morbid obesity.  Weight loss needed 3.  Type 2 diabetes.  Try and cut back a little bit on the Lantus and Continue sliding scale 4.  Chronic kidney disease stage III 5.  Essential hypertension continue metoprolol 6.  chronic lower extremity erythema. Not sure if this is her usual or if there is an infection or not. Check an ESR. Try to hold off on antibiotics since white count is normal and afebrile.  Code Status:     Code Status Orders        Start     Ordered   07/21/15 0342  Full code   Continuous     07/21/15 0341    Code Status History    Date Active Date Inactive Code Status Order ID Comments User Context   This patient has a current code status but no historical code status.     Disposition Plan:  To be determined  Consultants:   nephrology  Time spent:  35 minutes  Loletha Grayer  [employer]

## 2015-07-21 NOTE — H&P (Signed)
[facility] NAME: Gabrielle [name]    MR#:  [id number]  DATE OF BIRTH:  [birth year]  DATE OF ADMISSION:  07/20/2015  PRIMARY CARE PHYSICIAN: Alvester Chou, NP   REQUESTING/REFERRING PHYSICIAN:   CHIEF COMPLAINT:   Chief Complaint  Patient presents with  . Weakness    HISTORY OF PRESENT ILLNESS: Gabrielle [name]  is a 65 y.o. female with a known history of Diabetes mellitus2, osteoarthritis, hypertension presented to the emergency room with generalized weakness. Patient is morbidly obese and uses a lift to get up out of the bed and walks around with the help of a walker. She is mostly bed bound. Patient was evaluated in the emergency room was found to have elevated potassium greater than 7. EKG showed normal sinus rhythm with no T-wave changes. No complaints of chest pain. No history of palpitations. No history of headache dizziness or blurry vision. Patient received a IV calcium, bicarbonate and IV insulin and glucose in the emergency room for hyperkalemia. She also received oral Kayexalate and hospitalist service was consulted for further care of the patient. Patient does not have any history of renal failure and currently on oral Aldactone and ACE inhibitor at home.  PAST MEDICAL HISTORY:   Past Medical History  Diagnosis Date  . Arthritis   . Diabetes mellitus without complication ([place])   . Osteoarthritis   . Hypertension     PAST SURGICAL HISTORY: Past Surgical History  Procedure Laterality Date  . None      SOCIAL HISTORY:  Social History  Substance Use Topics  . Smoking status: Never Smoker   . Smokeless tobacco: Never Used  . Alcohol Use: No    FAMILY HISTORY:  Family History  Problem Relation Age of Onset  . Diabetes Mellitus II Father     DRUG ALLERGIES:  Allergies  Allergen Reactions  . Ultram [Tramadol] Other (See Comments)    Pt reports, "makes my head feel funny."  . Erythromycin Rash  .  Tetracyclines & Related Rash    REVIEW OF SYSTEMS:   CONSTITUTIONAL: No fever, has weakness.  EYES: No blurred or double vision.  EARS, NOSE, AND THROAT: No tinnitus or ear pain.  RESPIRATORY: No cough, shortness of breath, wheezing or hemoptysis.  CARDIOVASCULAR: No chest pain, orthopnea, edema.  GASTROINTESTINAL: No nausea, vomiting, diarrhea or abdominal pain.  GENITOURINARY: No dysuria, hematuria.  ENDOCRINE: No polyuria, nocturia,  HEMATOLOGY: No anemia, easy bruising or bleeding SKIN: No rash or lesion. MUSCULOSKELETAL: No joint pain or arthritis. Has lower extremity edema  NEUROLOGIC: No tingling, numbness, weakness.  PSYCHIATRY: No anxiety or depression.   MEDICATIONS AT HOME:  Prior to Admission medications   Medication Sig Start Date End Date Taking? Authorizing Provider  acetaminophen (TYLENOL) 500 MG tablet Take 500 mg by mouth 3 (three) times daily.   Yes Historical Provider, MD  captopril (CAPOTEN) 50 MG tablet Take 1 tablet by mouth 3 (three) times daily. 05/19/15  Yes Historical Provider, MD  cephALEXin (KEFLEX) 500 MG capsule Take 1 capsule by mouth 2 (two) times daily. 07/14/15  Yes Historical Provider, MD  Cholecalciferol (VITAMIN D3) 1000 units CAPS Take 2,000 Units by mouth daily.   Yes Historical Provider, MD  fluticasone (FLONASE) 50 MCG/ACT nasal spray Place 1 spray into both nostrils at bedtime.   Yes Historical Provider, MD  insulin glargine (LANTUS) 100 UNIT/ML injection Inject 70-80 Units into the skin daily.   Yes Historical Provider, MD  insulin  lispro (HUMALOG) 100 UNIT/ML injection Inject into the skin 3 (three) times daily with meals. Per sliding scale 1 units per 10 grams of carbs   Yes Historical Provider, MD  metoprolol succinate (TOPROL-XL) 100 MG 24 hr tablet Take 1 tablet by mouth daily. 05/18/15  Yes Historical Provider, MD  nabumetone (RELAFEN) 500 MG tablet Take 1 tablet by mouth 2 (two) times daily. 06/16/15  Yes Historical Provider, MD   neomycin-bacitracin-polymyxin (NEOSPORIN) ointment Apply 1 application topically as needed for wound care. apply to eye   Yes Historical Provider, MD  nystatin (NYSTATIN) powder Apply 1 g topically daily as needed.   Yes Historical Provider, MD  spironolactone (ALDACTONE) 50 MG tablet Take 1 tablet by mouth 2 (two) times daily. 06/16/15  Yes Historical Provider, MD  vitamin B-12 (CYANOCOBALAMIN) 1000 MCG tablet Take 1,000 mcg by mouth daily.   Yes Historical Provider, MD      PHYSICAL EXAMINATION:   VITAL SIGNS: Blood pressure 159/67, pulse 81, temperature 98.1 F (36.7 C), temperature source Oral, resp. rate 19, height 5\' 6"  (1.676 m), weight 216.819 kg (478 lb), SpO2 90 %.  GENERAL:  65 y.o.-year-old obese patient lying in the bed with no acute distress.  EYES: Pupils equal, round, reactive to light and accommodation. No scleral icterus. Extraocular muscles intact.  HEENT: Head atraumatic, normocephalic. Oropharynx and nasopharynx clear.  NECK:  Supple, no jugular venous distention. No thyroid enlargement, no tenderness.  LUNGS: Normal breath sounds bilaterally, no wheezing, rales,rhonchi or crepitation. No use of accessory muscles of respiration.  CARDIOVASCULAR: S1, S2 normal. No murmurs, rubs, or gallops.  ABDOMEN: Soft, nontender, nondistended. Bowel sounds present. No organomegaly or mass.  EXTREMITIES:No cyanosis, or clubbing. Has lower extremity edema . NEUROLOGIC: Cranial nerves II through XII are intact. Muscle strength 5/5 in all extremities. Sensation intact. Gait not checked.  PSYCHIATRIC: The patient is alert and oriented x 3.  SKIN: No obvious rash, lesion, or ulcer.   LABORATORY PANEL:   CBC  Recent Labs Lab 07/20/15 2256  WBC 7.8  HGB 14.0  HCT 43.3  PLT 145*  MCV 96.3  MCH 31.1  MCHC 32.3  RDW 14.4  LYMPHSABS 1.0  MONOABS 0.6  EOSABS 0.2  BASOSABS 0.0    ------------------------------------------------------------------------------------------------------------------  Chemistries   Recent Labs Lab 07/20/15 0100 07/20/15 2256  NA  --  137  K 7.5* >7.5*  CL  --  108  CO2  --  24  GLUCOSE  --  177*  BUN  --  26*  CREATININE  --  1.16*  CALCIUM  --  9.4  AST  --  20  ALT  --  18  ALKPHOS  --  62  BILITOT  --  1.3*   ------------------------------------------------------------------------------------------------------------------ estimated creatinine clearance is 97.1 mL/min (by C-G formula based on Cr of 1.16). ------------------------------------------------------------------------------------------------------------------  Recent Labs  07/20/15 2256  TSH 6.592*     Coagulation profile No results for input(s): INR, PROTIME in the last 168 hours. ------------------------------------------------------------------------------------------------------------------- No results for input(s): DDIMER in the last 72 hours. -------------------------------------------------------------------------------------------------------------------  Cardiac Enzymes  Recent Labs Lab 07/20/15 2256  TROPONINI <0.03   ------------------------------------------------------------------------------------------------------------------ Invalid input(s): POCBNP  ---------------------------------------------------------------------------------------------------------------  Urinalysis    Component Value Date/Time   COLORURINE YELLOW* 07/20/2015 2244   APPEARANCEUR CLEAR* 07/20/2015 2244   LABSPEC 1.025 07/20/2015 2244   PHURINE 5.0 07/20/2015 2244   GLUCOSEU NEGATIVE 07/20/2015 2244   HGBUR NEGATIVE 07/20/2015 2244   [place] NEGATIVE 07/20/2015 2244   KETONESUR TRACE* 07/20/2015 2244  PROTEINUR 100* 07/20/2015 2244   NITRITE NEGATIVE 07/20/2015 2244   LEUKOCYTESUR NEGATIVE 07/20/2015 2244     RADIOLOGY: Dg Chest 1  View  07/20/2015  CLINICAL DATA:  Acute onset of worsening generalized weakness and immobility. Initial encounter. EXAM: CHEST 1 VIEW COMPARISON:  None. FINDINGS: The lungs are well-aerated. Vascular congestion is noted. Mildly increased interstitial markings may reflect minimal interstitial edema. There is no evidence of pleural effusion or pneumothorax. The cardiomediastinal silhouette is mildly enlarged. No acute osseous abnormalities are seen. IMPRESSION: Vascular congestion and mild cardiomegaly. Mildly increased interstitial markings may reflect minimal interstitial edema. Electronically Signed   By: Garald Balding M.D.   On: 07/20/2015 22:26    EKG: Orders placed or performed during the hospital encounter of 07/20/15  . ED EKG  . ED EKG    IMPRESSION AND PLAN: 65 year old obese female patient with history of diabetes mellitus, hypertension, arthritis presented to the emergency room with weakness. Admitting diagnosis 1. Severe hyperkalemia 2. Obesity 3. Elevated TSH level 4. Diabetes mellitus type 2 5. Hypertension Treatment plan Admit patient to telemetry Follow-up potassium level Nephrology consultation for hyperkalemia Check T3 and T4 levels Resume medications for hypertension and diabetes DVT prophylaxis subcutaneous Lovenox 40 MG daily Supportive care.  All the records are reviewed and case discussed with ED provider. Management plans discussed with the patient, family and they are in agreement.  CODE STATUS:FULL Code Status History    This patient does not have a recorded code status. Please follow your organizational policy for patients in this situation.       TOTAL TIME TAKING CARE OF THIS PATIENT: 55 minutes.    Saundra Shelling M.D on 07/21/2015 at 2:11 AM  Between 7am to 6pm - Pager - [phone]  After 6pm go to [email or web address] - password [email or web address] [facility]  [facility]  Office  [phone]  CC: Primary care physician; Alvester Chou, NP

## 2015-07-21 NOTE — Progress Notes (Signed)
Anticoagulation Monitoring  Patient is a 65 yo female with orders for Lovenox 40 mg subq daily.  Patient has est CrCl~98.8 mL/min and BMI is 77.  Per anticoagulation policy, will transition patient to Lovenox 40 mg subq q12h based on renal function and BMI>40.  Pharmacy will continue to monitor per policy.  Murrell Converse, PharmD Clinical Pharmacist 07/21/2015

## 2015-07-21 NOTE — ED Provider Notes (Signed)
-----------------------------------------   12:27 AM on 07/21/2015 -----------------------------------------  Noted potassium >7.5 in the setting of normal kidney function. Will repeat potassium. However, will go ahead and initiate treatment for hyperkalemia while awaiting repeat potassium.  ----------------------------------------- 1:35 AM on 07/21/2015 -----------------------------------------  Repeat potassium is 7.5; will initiate kayexelate. Discussed with hospitalist to evaluate patient in the emergency department for admission.  Paulette Blanch, MD 07/21/15 [phone]

## 2015-07-22 LAB — GLUCOSE, CAPILLARY
GLUCOSE-CAPILLARY: 134 mg/dL — AB (ref 65–99)
GLUCOSE-CAPILLARY: 81 mg/dL (ref 65–99)
GLUCOSE-CAPILLARY: 82 mg/dL (ref 65–99)
Glucose-Capillary: 83 mg/dL (ref 65–99)
Glucose-Capillary: 154 mg/dL — ABNORMAL HIGH (ref 65–99)

## 2015-07-22 LAB — BASIC METABOLIC PANEL
Anion gap: 7 (ref 5–15)
BUN: 12 mg/dL (ref 6–20)
CALCIUM: 8.9 mg/dL (ref 8.9–10.3)
CO2: 27 mmol/L (ref 22–32)
CREATININE: 0.83 mg/dL (ref 0.44–1.00)
Chloride: 106 mmol/L (ref 101–111)
GFR calc Af Amer: >60 mL/min (ref >60)
GFR calc non Af Amer: >60 mL/min (ref >60)
GLUCOSE: 82 mg/dL (ref 65–99)
Potassium: 5.1 mmol/L (ref 3.5–5.1)
Sodium: 140 mmol/L (ref 135–145)

## 2015-07-22 LAB — URINALYSIS COMPLETE WITH MICROSCOPIC (ARMC ONLY)
BACTERIA UA: NONE SEEN
Bilirubin Urine: NEGATIVE
GLUCOSE, UA: NEGATIVE mg/dL
HGB URINE DIPSTICK: NEGATIVE
Leukocytes, UA: NEGATIVE
Nitrite: NEGATIVE
Protein, ur: NEGATIVE mg/dL
RBC / HPF: NONE SEEN RBC/hpf (ref 0–5)
SPECIFIC GRAVITY, URINE: 1.006 (ref 1.005–1.030)
SQUAMOUS EPITHELIAL / LPF: NONE SEEN
pH: 5 (ref 5.0–8.0)

## 2015-07-22 LAB — T4: T4 TOTAL: 5.5 ug/dL (ref 4.5–12.0)

## 2015-07-22 LAB — T3, FREE: T3 FREE: 2 pg/mL (ref 2.0–4.4)

## 2015-07-22 MED ORDER — ETHACRYNATE SODIUM 50 MG IV SOLR
50.0000 mg | Freq: Every day | INTRAVENOUS | Status: DC
  Administered 2015-07-22 – 2015-07-25 (×4): 50 mg via INTRAVENOUS
  Filled 2015-07-22 (×6): qty 50

## 2015-07-22 MED ORDER — HYDRALAZINE HCL 20 MG/ML IJ SOLN: 10.0000 mg | INTRAMUSCULAR | Status: DC | PRN

## 2015-07-22 MED ORDER — METOPROLOL TARTRATE 100 MG PO TABS
100.0000 mg | ORAL_TABLET | Freq: Once | ORAL | Status: AC
  Administered 2015-07-22: 100 mg via ORAL
  Filled 2015-07-22: qty 1

## 2015-07-22 MED ORDER — DEXTROSE 5 % IV SOLN
2.0000 g | INTRAVENOUS | Status: DC
  Administered 2015-07-23 – 2015-07-26 (×4): 2 g via INTRAVENOUS
  Filled 2015-07-22 (×5): qty 2

## 2015-07-22 MED ORDER — LORAZEPAM 1 MG PO TABS
1.0000 mg | ORAL_TABLET | Freq: Once | ORAL | Status: AC
  Administered 2015-07-22: 1 mg via ORAL
  Filled 2015-07-22: qty 1

## 2015-07-22 MED ORDER — DEXTROSE 5 % IV SOLN
2.0000 g | Freq: Once | INTRAVENOUS | Status: AC
  Administered 2015-07-22: 2 g via INTRAVENOUS
  Filled 2015-07-22: qty 2

## 2015-07-22 MED ORDER — LORAZEPAM 0.5 MG PO TABS
0.5000 mg | ORAL_TABLET | Freq: Three times a day (TID) | ORAL | Status: DC | PRN
  Administered 2015-07-23 – 2015-07-26 (×4): 0.5 mg via ORAL
  Filled 2015-07-22 (×4): qty 1

## 2015-07-22 NOTE — Care Management (Signed)
Provided list of local skilled nursing facilities per request of son

## 2015-07-22 NOTE — Progress Notes (Addendum)
Patient ID: Gabrielle [name], female   DOB: [birth year], 65 y.o.   MRN: [id number] [facility] PROGRESS NOTE  Gabrielle [name] [id number]:[id number] DOB: Apr 07, 1950 DOA: 07/20/2015 PCP: Alvester Chou, NP  HPI/Subjective: Patient very shaky since dialysis last night and very nervous. She is asking to get rid of some fluid while she is here. Very weak.  Objective: Filed Vitals:   07/22/15 1220 07/22/15 1220  BP: 127/62   Pulse: 84 85  Temp: 98.9 F (37.2 C)   Resp: 22     Filed Weights   07/20/15 2204 07/21/15 2045 07/21/15 2341  Weight: 216.819 kg (478 lb) 216.819 kg (478 lb) 216.819 kg (478 lb)    ROS: Review of Systems  Constitutional: Negative for fever and chills.  Eyes: Negative for blurred vision.  Respiratory: Negative for cough and shortness of breath.   Cardiovascular: Negative for chest pain.  Gastrointestinal: Negative for nausea, vomiting, abdominal pain, diarrhea and constipation.  Genitourinary: Negative for dysuria.  Musculoskeletal: Negative for joint pain.  Neurological: Positive for weakness. Negative for dizziness and headaches.   Exam: Physical Exam  Constitutional: She is oriented to person, place, and time.  HENT:  Nose: No mucosal edema.  Mouth/Throat: No oropharyngeal exudate or posterior oropharyngeal edema.  Eyes: Conjunctivae, EOM and lids are normal. Pupils are equal, round, and reactive to light.  Neck: No JVD present. Carotid bruit is not present. No edema present. No thyroid mass and no thyromegaly present.  Cardiovascular: S1 normal and S2 normal.  Exam reveals no gallop.   No murmur heard. Respiratory: No respiratory distress. She has no wheezes. She has no rhonchi. She has no rales.  GI: Soft. Bowel sounds are normal. There is no tenderness.  Musculoskeletal:       Right ankle: She exhibits swelling.       Left ankle: She exhibits swelling.  Lymphadenopathy:    She has no cervical adenopathy.  Neurological: She is alert and oriented to person,  place, and time. No cranial nerve deficit.  Skin: Skin is warm. Nails show no clubbing.   Chronic erythema of bilateral lower extremities patient states that she takes suppressive Keflex at home  Psychiatric: She has a normal mood and affect.      Data Reviewed: Basic Metabolic Panel:  Recent Labs Lab 07/20/15 0100 07/20/15 2256 07/21/15 0543 07/21/15 1225 07/21/15 2117 07/22/15 0415  NA  --  137 140 142  --  140  K 7.5* >7.5* 7.4* 7.2*  --  5.1  CL  --  108 111 111  --  106  CO2  --  24 26 25   --  27  GLUCOSE  --  177* 108* 108*  --  82  BUN  --  26* 25* 22*  --  12  CREATININE  --  1.16* 1.14* 1.13*  --  0.83  CALCIUM  --  9.4 9.0 9.4  --  8.9  PHOS  --   --   --   --  4.2  --    Liver Function Tests:  Recent Labs Lab 07/20/15 2256  AST 20  ALT 18  ALKPHOS 62  BILITOT 1.3*  PROT 8.0  ALBUMIN 4.2   CBC:  Recent Labs Lab 07/20/15 2256 07/21/15 0543  WBC 7.8 9.1  NEUTROABS 6.0  --   HGB 14.0 12.4  HCT 43.3 38.7  MCV 96.3 97.9  PLT 145* 140*   Cardiac Enzymes:  Recent Labs Lab 07/20/15 2256  CKTOTAL 108  TROPONINI <0.03  CBG:  Recent Labs Lab 07/21/15 1123 07/21/15 1639 07/22/15 0055 07/22/15 0734 07/22/15 1143  GLUCAP 108* 91 82 83 81     Studies: Dg Chest 1 View  07/21/2015  CLINICAL DATA:  Central line placement. EXAM: CHEST 1 VIEW COMPARISON:  Chest x-ray dated 07/20/2015 FINDINGS: Right IJ central line has been placed with tip adequately positioned at the level of the mid SVC. No pneumothorax. No pleural effusion seen. Cardiomegaly is stable. Overall cardiomediastinal silhouette is stable in size and configuration. The interstitial prominence, presumed interstitial edema, appears stable. IMPRESSION: 1. Right IJ central line now in place with tip positioned at the level of the mid SVC. No pneumothorax. 2. Persistent interstitial prominence, stable, presumed mild interstitial edema. Electronically Signed   By: Franki Cabot M.D.   On:  07/21/2015 18:33   Dg Chest 1 View  07/20/2015  CLINICAL DATA:  Acute onset of worsening generalized weakness and immobility. Initial encounter. EXAM: CHEST 1 VIEW COMPARISON:  None. FINDINGS: The lungs are well-aerated. Vascular congestion is noted. Mildly increased interstitial markings may reflect minimal interstitial edema. There is no evidence of pleural effusion or pneumothorax. The cardiomediastinal silhouette is mildly enlarged. No acute osseous abnormalities are seen. IMPRESSION: Vascular congestion and mild cardiomegaly. Mildly increased interstitial markings may reflect minimal interstitial edema. Electronically Signed   By: Garald Balding M.D.   On: 07/20/2015 22:26    Scheduled Meds: . [START ON 07/23/2015] cefTRIAXone (ROCEPHIN)  IV  2 g Intravenous Q24H  . cholecalciferol  2,000 Units Oral Daily  . enoxaparin (LOVENOX) injection  40 mg Subcutaneous Q12H  . ethadrynate sodium (EDECRIN) IVPB  50 mg Intravenous Daily  . fluticasone  1 spray Each Nare QHS  . insulin aspart  0-20 Units Subcutaneous TID WC  . insulin aspart  0-5 Units Subcutaneous QHS  . insulin glargine  55 Units Subcutaneous Daily  . metoprolol succinate  100 mg Oral Daily  . patiromer  25.2 g Oral Daily  . sodium chloride flush  3 mL Intravenous Q12H  . sodium chloride flush  3 mL Intravenous Q12H  . vitamin B-12  1,000 mcg Oral Daily    Assessment/Plan:  1. Life threatening hyperkalemia. Required dialysis last night to improve the potassium level. Likely brought on with captopril and spironolactone 2. Bilateral lower extremity cellulitis start IV Rocephin. Check an ESR tomorrow 3. Severe weakness. Physical therapy evaluation 4. morbid obesity.  Weight loss needed 5. Type 2 diabetes.  Try and cut back a little bit on the Lantus and Continue sliding scale 6. Essential hypertension continue metoprolol 7. Edema- iv edecrin   Code Status:     Code Status Orders        Start     Ordered   07/21/15 0342   Full code   Continuous     07/21/15 0341    Code Status History    Date Active Date Inactive Code Status Order ID Comments User Context   This patient has a current code status but no historical code status.     Disposition Plan:  To be determined  Consultants:   nephrology  Time spent:  22 minutes  Loletha Grayer  [employer]

## 2015-07-22 NOTE — Care Management (Signed)
Patient had dialysis treatment to lower potassium and level is now 5.1.  Placed call to Jerilynn Mages office to determine whether office would continue to make house calls after patient returns home and left voicemail.  Order present for physical therapy consult.

## 2015-07-22 NOTE — Evaluation (Signed)
Physical Therapy Evaluation Patient Details Name: Gabrielle [name] MRN: [id number] DOB: 17-Sep-1950 Today's Date: 07/22/2015   History of Present Illness  Patient is a 65 y/o female that presents with increasing weakness. Noted to have K+ over 7.5 initially, currently 5.1 after dialysis. Now has a temporary dialysis catheter in her R IJ placed on this admission.   Clinical Impression  Patient has largely been home bound for the past 4 years, primarily a result of weight related functional mobility decline. She is now relegated largely to transfers to a power wheelchair, via a standard walker. She has been receiving support from her family, however remains largely set on remaining as independent as possible. She is acutely weak and deconditioned, even relative to her baseline. She requires tremendous support to complete even bed mobility, unable to perform standing today with max A x2. She typically uses a lift chair to complete standing, however given her performance today she is clearly declined from her baseline and unable to return home safely even with the many adaptations her son has built. Would recommend SNF placement for short term rehab prior to any further home or living modifications.     Follow Up Recommendations SNF    Equipment Recommendations       Recommendations for Other Services OT consult     Precautions / Restrictions Precautions Precautions: Fall Restrictions Weight Bearing Restrictions: No      Mobility  Bed Mobility Overal bed mobility: +2 for physical assistance;Needs Assistance Bed Mobility: Supine to Sit;Sit to Supine;Rolling Rolling: +2 for physical assistance;Max assist   Supine to sit: +2 for physical assistance;Max assist Sit to supine: +2 for physical assistance;Max assist   General bed mobility comments: Patient makes effort to attempt rolling and supine to sit transfers, however she is limited by body habitus and unable to provide adequate support  through her UEs, requires assistance to bring LEs off the edge of the bed.   Transfers                 General transfer comment: Patient unable to stand with BRW, LEs unable to fit inside the RW. Would recommend bringing bed closer to sink counter so that patient can use her UEs.   Ambulation/Gait                Stairs            Wheelchair Mobility    Modified Rankin (Stroke Patients Only)       Balance Overall balance assessment: Needs assistance Sitting-balance support: Bilateral upper extremity supported Sitting balance-Leahy Scale: Fair       Standing balance-Leahy Scale: Zero                               Pertinent Vitals/Pain Pain Assessment: No/denies pain    Home Living Family/patient expects to be discharged to:: Skilled nursing facility Living Arrangements: Alone Available Help at Discharge: Family Type of Home: House         Home Equipment: Walker - standard;Wheelchair - power;Toilet riser;Grab bars - toilet;Grab bars - tub/shower (Lift chair )      Prior Function Level of Independence: Independent with assistive device(s)         Comments: At baseline, patient can transfer from lift chair to power w/c with standard walker, able to get on and off commode with railing, SW to transfer to power w/c.      Hand Dominance  Extremity/Trunk Assessment   Upper Extremity Assessment:  (Painful, unable to fully assess due to OA/mobility restrictions. )           Lower Extremity Assessment:  (Unable to fully assess due to body habitus )         Communication   Communication: No difficulties  Cognition Arousal/Alertness: Awake/alert Behavior During Therapy: WFL for tasks assessed/performed Overall Cognitive Status: Within Functional Limits for tasks assessed                      General Comments General comments (skin integrity, edema, etc.): LE swelling and flaking skin bilaterally.      Exercises        Assessment/Plan    PT Assessment Patient needs continued PT services  PT Diagnosis Difficulty walking;Generalized weakness   PT Problem List Decreased strength;Decreased skin integrity;Decreased knowledge of use of DME;Decreased activity tolerance;Decreased balance;Cardiopulmonary status limiting activity;Decreased mobility;Obesity  PT Treatment Interventions DME instruction;Gait training;Stair training;Therapeutic activities;Therapeutic exercise;Balance training   PT Goals (Current goals can be found in the Care Plan section) Acute Rehab PT Goals Patient Stated Goal: To increase her independence with mobility.  PT Goal Formulation: With patient/family Time For Goal Achievement: 08/05/15 Potential to Achieve Goals: Fair    Frequency Min 2X/week   Barriers to discharge        Co-evaluation               End of Session Equipment Utilized During Treatment: Gait belt Activity Tolerance: Patient tolerated treatment well;Patient limited by fatigue Patient left: in bed;with call bell/phone within reach;with nursing/sitter in room;with family/visitor present Nurse Communication: Mobility status         Time: [id number] PT Time Calculation (min) (ACUTE ONLY): 39 min   Charges:   PT Evaluation $PT Eval Moderate Complexity: 1 Procedure PT Treatments $Therapeutic Activity: 23-37 mins (performed extensive rolling/additional bed mobility to clean bed sheets and move patient up in bed).    PT G Codes:       Kerman Passey, PT, DPT    07/22/2015, 6:51 PM

## 2015-07-22 NOTE — Care Management (Addendum)
Physical therapy is recommending skilled nursing placement and informed that 'family in agreement."  CM spoke with patient in the presence of her son and her sister.  She is in agreement with short term skilled nursing placement.  Discussed insurance coverage and skilled level of care.  Discussed some long range plans and how to finance: continued care in skilled facility not covered by medicare, assisted living (patient lack of mobility may prevent this level of care)- not covered by medicare, medicaid- medicaid spin down, in home private pay  caregivers (list is in the room) [employer].    Updated CSW of need for SNf.  Would anticipate discharge within the next 24-48 hours is condition remains stable.  Asks for other resources for in home physician care.  had bad experience with doctors making house Calls. Provided literature from [place]

## 2015-07-22 NOTE — Progress Notes (Signed)
Patient has dialysis done. She is NSR with VS WDL for patient. She denied pain, and N&V. She was transferred to bariatric bed. Patient was rounded on Q2 and as needed. Patient's needed items were placed within patient's reach. Will continue to monitor.  Patient's potassium level dropped to 5.1 from 7.2.

## 2015-07-22 NOTE — Progress Notes (Addendum)
Patient c/o burning with urination, Dr. Leslye Peer notified and UA ordered. Patient is incontinent, also received order for in and out cath.  Gabrielle [name] R [place]   1700: patient had incontinent episode before in and out cath, and requested to wait to have in and out cath completed.

## 2015-07-22 NOTE — Progress Notes (Signed)
[facility]  ROUNDING NOTE   Subjective:  Serum potassium significantly down to 5.1 after one episode of dialysis. Patient initially declined to take patiromer this a.m. butnow okay to take it. Overall feeling better as compared to admission.   Objective:  Vital signs in last 24 hours:  Temp:  [97.8 F (36.6 C)-98.9 F (37.2 C)] 98.9 F (37.2 C) (06/14 1220) Pulse Rate:  [77-87] 85 (06/14 1220) Resp:  [16-25] 22 (06/14 1220) BP: (107-160)/(42-75) 127/62 mmHg (06/14 1220) SpO2:  [89 %-92 %] 91 % (06/14 1220) Weight:  [216.819 kg (478 lb)] 216.819 kg (478 lb) (06/13 2341)  Weight change: -0 kg (-0 oz) Filed Weights   07/20/15 2204 07/21/15 2045 07/21/15 2341  Weight: 216.819 kg (478 lb) 216.819 kg (478 lb) 216.819 kg (478 lb)    Intake/Output: I/O last 3 completed shifts: In: 240 [P.O.:240] Out: 0    Intake/Output this shift:  Total I/O In: 480 [P.O.:480] Out: 0   Physical Exam: General: Morbidly obese female  Head: Normocephalic, atraumatic. Moist oral mucosal membranes  Eyes: Anicteric  Neck: Supple, trachea midline  Lungs:  Clear to auscultation, normal effort  Heart: S1S2 no rubs  Abdomen:  Large pannus, soft, NTND   Extremities: 1+ peripheral edema, b/l LE cellulitis  Neurologic: Nonfocal, moving all four extremities  Skin: B/l LE cellulitis       Basic Metabolic Panel:  Recent Labs Lab 07/20/15 0100  07/20/15 2256 07/21/15 0543 07/21/15 1225 07/21/15 2117 07/22/15 0415  NA  --   --  137 140 142  --  140  K 7.5*  --  >7.5* 7.4* 7.2*  --  5.1  CL  --   --  108 111 111  --  106  CO2  --   --  24 26 25   --  27  GLUCOSE  --   --  177* 108* 108*  --  82  BUN  --   --  26* 25* 22*  --  12  CREATININE  --   --  1.16* 1.14* 1.13*  --  0.83  CALCIUM  --   < > 9.4 9.0 9.4  --  8.9  PHOS  --   --   --   --   --  4.2  --   < > = values in this interval not displayed.  Liver Function Tests:  Recent Labs Lab 07/20/15 2256  AST 20  ALT  18  ALKPHOS 62  BILITOT 1.3*  PROT 8.0  ALBUMIN 4.2   No results for input(s): LIPASE, AMYLASE in the last 168 hours. No results for input(s): AMMONIA in the last 168 hours.  CBC:  Recent Labs Lab 07/20/15 2256 07/21/15 0543  WBC 7.8 9.1  NEUTROABS 6.0  --   HGB 14.0 12.4  HCT 43.3 38.7  MCV 96.3 97.9  PLT 145* 140*    Cardiac Enzymes:  Recent Labs Lab 07/20/15 2256  CKTOTAL 108  TROPONINI <0.03    BNP: Invalid input(s): POCBNP  CBG:  Recent Labs Lab 07/21/15 1123 07/21/15 1639 07/22/15 0055 07/22/15 0734 07/22/15 1143  GLUCAP 108* 91 82 83 81    Microbiology: No results found for this or any previous visit.  Coagulation Studies: No results for input(s): LABPROT, INR in the last 72 hours.  Urinalysis:  Recent Labs  07/20/15 2244  COLORURINE YELLOW*  LABSPEC 1.025  PHURINE 5.0  GLUCOSEU NEGATIVE  HGBUR NEGATIVE  BILIRUBINUR NEGATIVE  KETONESUR TRACE*  PROTEINUR 100*  NITRITE NEGATIVE  LEUKOCYTESUR NEGATIVE      Imaging: Dg Chest 1 View  07/21/2015  CLINICAL DATA:  Central line placement. EXAM: CHEST 1 VIEW COMPARISON:  Chest x-ray dated 07/20/2015 FINDINGS: Right IJ central line has been placed with tip adequately positioned at the level of the mid SVC. No pneumothorax. No pleural effusion seen. Cardiomegaly is stable. Overall cardiomediastinal silhouette is stable in size and configuration. The interstitial prominence, presumed interstitial edema, appears stable. IMPRESSION: 1. Right IJ central line now in place with tip positioned at the level of the mid SVC. No pneumothorax. 2. Persistent interstitial prominence, stable, presumed mild interstitial edema. Electronically Signed   By: Franki Cabot M.D.   On: 07/21/2015 18:33   Dg Chest 1 View  07/20/2015  CLINICAL DATA:  Acute onset of worsening generalized weakness and immobility. Initial encounter. EXAM: CHEST 1 VIEW COMPARISON:  None. FINDINGS: The lungs are well-aerated. Vascular  congestion is noted. Mildly increased interstitial markings may reflect minimal interstitial edema. There is no evidence of pleural effusion or pneumothorax. The cardiomediastinal silhouette is mildly enlarged. No acute osseous abnormalities are seen. IMPRESSION: Vascular congestion and mild cardiomegaly. Mildly increased interstitial markings may reflect minimal interstitial edema. Electronically Signed   By: Garald Balding M.D.   On: 07/20/2015 22:26     Medications:     . [START ON 07/23/2015] cefTRIAXone (ROCEPHIN)  IV  2 g Intravenous Q24H  . cholecalciferol  2,000 Units Oral Daily  . enoxaparin (LOVENOX) injection  40 mg Subcutaneous Q12H  . ethadrynate sodium (EDECRIN) IVPB  50 mg Intravenous Daily  . fluticasone  1 spray Each Nare QHS  . insulin aspart  0-20 Units Subcutaneous TID WC  . insulin aspart  0-5 Units Subcutaneous QHS  . insulin glargine  55 Units Subcutaneous Daily  . metoprolol succinate  100 mg Oral Daily  . patiromer  25.2 g Oral Daily  . sodium chloride flush  3 mL Intravenous Q12H  . sodium chloride flush  3 mL Intravenous Q12H  . vitamin B-12  1,000 mcg Oral Daily   sodium chloride, acetaminophen **OR** acetaminophen, hydrALAZINE, LORazepam, neomycin-bacitracin-polymyxin, ondansetron **OR** ondansetron (ZOFRAN) IV, sodium chloride flush  Assessment/ Plan:  65 y.o. female with a PMHx of osteoarthritis, diabetes mellitus type 2, hypertension, severe morbid obesity, who was admitted to [facility] on 07/20/2015 for evaluation of weakness.   1. Severe hyperkalemia secondary to spironolactone and captopril. 2. Bilateral lower extremity edema with cellulitis. 3. Hypertension.  Plan:  The patient tolerated hemodialysis well yesterday.  Potassium down to 5.1.  No further indication for dialysis at this time.  We will maintain the patient on patiromer for now.  We will consider removing temporary dialysis catheter tomorrow if potassium remains low. Would avoid any further  use of spironolactone.   LOS: 1 Gabrielle [name] 6/14/20173:19 PM

## 2015-07-23 LAB — HEPATITIS C ANTIBODY: HCV Ab: <0.1 {s_co_ratio} (ref 0.0–0.9)

## 2015-07-23 LAB — HEPATITIS B SURFACE ANTIBODY, QUANTITATIVE

## 2015-07-23 LAB — PARATHYROID HORMONE, INTACT (NO CA): PTH: 26 pg/mL (ref 15–65)

## 2015-07-23 LAB — GLUCOSE, CAPILLARY
GLUCOSE-CAPILLARY: 131 mg/dL — AB (ref 65–99)
GLUCOSE-CAPILLARY: 158 mg/dL — AB (ref 65–99)
GLUCOSE-CAPILLARY: 133 mg/dL — AB (ref 65–99)
Glucose-Capillary: 152 mg/dL — ABNORMAL HIGH (ref 65–99)

## 2015-07-23 LAB — BASIC METABOLIC PANEL
ANION GAP: 9 (ref 5–15)
BUN: 13 mg/dL (ref 6–20)
CALCIUM: 8.9 mg/dL (ref 8.9–10.3)
CO2: 27 mmol/L (ref 22–32)
CREATININE: 1.15 mg/dL — AB (ref 0.44–1.00)
Chloride: 104 mmol/L (ref 101–111)
GFR calc non Af Amer: 50 mL/min — ABNORMAL LOW (ref >60)
GFR, EST AFRICAN AMERICAN: 58 mL/min — AB (ref >60)
Glucose, Bld: 139 mg/dL — ABNORMAL HIGH (ref 65–99)
Potassium: 5 mmol/L (ref 3.5–5.1)
SODIUM: 140 mmol/L (ref 135–145)

## 2015-07-23 LAB — BRAIN NATRIURETIC PEPTIDE: B NATRIURETIC PEPTIDE 5: 110 pg/mL — AB (ref 0.0–100.0)

## 2015-07-23 LAB — HEPATITIS B SURFACE ANTIGEN: Hepatitis B Surface Ag: NEGATIVE

## 2015-07-23 LAB — SEDIMENTATION RATE: Sed Rate: 28 mm/hr (ref 0–30)

## 2015-07-23 NOTE — Progress Notes (Signed)
Patient ID: Gabrielle [name], female   DOB: [birth year], 65 y.o.   MRN: [id number] [employer] PROGRESS NOTE  Gabrielle [name] [id number] DOB: Apr 11, 1950 DOA: 07/20/2015 PCP: Alvester Chou, NP  HPI/Subjective: Patient feeling better today. Legs feel less tight. Urinating a lot. Thought she had a urine infection.  Objective: Filed Vitals:   07/23/15 0925 07/23/15 1420  BP: 147/59 117/60  Pulse: 78 69  Temp:  98.2 F (36.8 C)  Resp:  19    Filed Weights   07/20/15 2204 07/21/15 2045 07/21/15 2341  Weight: 216.819 kg (478 lb) 216.819 kg (478 lb) 216.819 kg (478 lb)    ROS: Review of Systems  Constitutional: Negative for fever and chills.  Eyes: Negative for blurred vision.  Respiratory: Negative for cough and shortness of breath.   Cardiovascular: Negative for chest pain.  Gastrointestinal: Negative for nausea, vomiting, abdominal pain, diarrhea and constipation.  Genitourinary: Positive for dysuria.  Musculoskeletal: Negative for joint pain.  Neurological: Positive for weakness. Negative for dizziness and headaches.   Exam: Physical Exam  Constitutional: She is oriented to person, place, and time.  HENT:  Nose: No mucosal edema.  Mouth/Throat: No oropharyngeal exudate or posterior oropharyngeal edema.  Eyes: Conjunctivae, EOM and lids are normal. Pupils are equal, round, and reactive to light.  Neck: No JVD present. Carotid bruit is not present. No edema present. No thyroid mass and no thyromegaly present.  Cardiovascular: S1 normal and S2 normal.  Exam reveals no gallop.   No murmur heard. Respiratory: No respiratory distress. She has no wheezes. She has no rhonchi. She has no rales.  GI: Soft. Bowel sounds are normal. There is no tenderness.  Musculoskeletal:       Right ankle: She exhibits swelling.       Left ankle: She exhibits swelling.  Lymphadenopathy:    She has no cervical adenopathy.  Neurological: She is alert and oriented to person, place, and time. No  cranial nerve deficit.  Skin: Skin is warm. Nails show no clubbing.  The erythema on bilateral lower extremities is a little less red than yesterday. The warmth is settled down. She does have some redness in bilateral inner thigh. That still red.  Psychiatric: She has a normal mood and affect.      Data Reviewed: Basic Metabolic Panel:  Recent Labs Lab 07/20/15 2256 07/21/15 0543 07/21/15 1225 07/21/15 2117 07/22/15 0415 07/23/15 0459  NA 137 140 142  --  140 140  K >7.5* 7.4* 7.2*  --  5.1 5.0  CL 108 111 111  --  106 104  CO2 24 26 25   --  27 27  GLUCOSE 177* 108* 108*  --  82 139*  BUN 26* 25* 22*  --  12 13  CREATININE 1.16* 1.14* 1.13*  --  0.83 1.15*  CALCIUM 9.4 9.0 9.4  --  8.9 8.9  PHOS  --   --   --  4.2  --   --    Liver Function Tests:  Recent Labs Lab 07/20/15 2256  AST 20  ALT 18  ALKPHOS 62  BILITOT 1.3*  PROT 8.0  ALBUMIN 4.2   CBC:  Recent Labs Lab 07/20/15 2256 07/21/15 0543  WBC 7.8 9.1  NEUTROABS 6.0  --   HGB 14.0 12.4  HCT 43.3 38.7  MCV 96.3 97.9  PLT 145* 140*   Cardiac Enzymes:  Recent Labs Lab 07/20/15 2256  CKTOTAL 108  TROPONINI <0.03    CBG:  Recent Labs Lab 07/22/15  1143 07/22/15 1703 07/22/15 2046 07/23/15 0722 07/23/15 1159  GLUCAP 81 134* 154* 131* 152*     Studies: Dg Chest 1 View  07/21/2015  CLINICAL DATA:  Central line placement. EXAM: CHEST 1 VIEW COMPARISON:  Chest x-ray dated 07/20/2015 FINDINGS: Right IJ central line has been placed with tip adequately positioned at the level of the mid SVC. No pneumothorax. No pleural effusion seen. Cardiomegaly is stable. Overall cardiomediastinal silhouette is stable in size and configuration. The interstitial prominence, presumed interstitial edema, appears stable. IMPRESSION: 1. Right IJ central line now in place with tip positioned at the level of the mid SVC. No pneumothorax. 2. Persistent interstitial prominence, stable, presumed mild interstitial edema.  Electronically Signed   By: Franki Cabot M.D.   On: 07/21/2015 18:33    Scheduled Meds: . cefTRIAXone (ROCEPHIN)  IV  2 g Intravenous Q24H  . cholecalciferol  2,000 Units Oral Daily  . enoxaparin (LOVENOX) injection  40 mg Subcutaneous Q12H  . ethadrynate sodium (EDECRIN) IVPB  50 mg Intravenous Daily  . fluticasone  1 spray Each Nare QHS  . insulin aspart  0-20 Units Subcutaneous TID WC  . insulin aspart  0-5 Units Subcutaneous QHS  . insulin glargine  55 Units Subcutaneous Daily  . metoprolol succinate  100 mg Oral Daily  . patiromer  25.2 g Oral Daily  . sodium chloride flush  3 mL Intravenous Q12H  . sodium chloride flush  3 mL Intravenous Q12H  . vitamin B-12  1,000 mcg Oral Daily    Assessment/Plan:  1. Life threatening hyperkalemia. Now has resolved. Likely brought on by spironolactone and captopril 2. Bilateral lower extremity cellulitis started IV Rocephin 3. Severe weakness. Physical therapy recommended rehabilitation 4. morbid obesity.  Weight loss needed 5. Type 2 diabetes.  Lantus and sliding scale 6. Essential hypertension continue metoprolol 7. Edema- iv edecrin. Likely switch to by mouth upon discharge out of the hospital.   Code Status:     Code Status Orders        Start     Ordered   07/21/15 0342  Full code   Continuous     07/21/15 0341    Code Status History    Date Active Date Inactive Code Status Order ID Comments User Context   This patient has a current code status but no historical code status.     Disposition Plan:  Likely out to rehabilitation tomorrow depending on how cellulitis is doing.  Consultants:   nephrology  Time spent:  20 minutes  Loletha Grayer  [employer]

## 2015-07-23 NOTE — Clinical Social Work Note (Signed)
Clinical Social Work Assessment  Patient Details  Name: Gabrielle [name] MRN: [id number] Date of Birth: July 22, 1950  Date of referral:  07/23/15               Reason for consult:  Discharge Planning                Permission sought to share information with:    Permission granted to share information::     Name::        Agency::     Relationship::     Contact Information:     Housing/Transportation Living arrangements for the past 2 months:  Single Family Home Source of Information:  Patient Patient Interpreter Needed:  None Criminal Activity/Legal Involvement Pertinent to Current Situation/Hospitalization:  No - Comment as needed Significant Relationships:  Adult Children, Other Family Members Lives with:    Do you feel safe going back to the place where you live?  Yes Need for family participation in patient care:  No (Coment)  Care giving concerns:  Patient is agreeable to SNF placement.    Social Worker assessment / plan:  CSW met with patient. Introduced herself and her role. Patient reports that she feels she'll be willing to go to a SNF for STR. Stated that she's weak and has experienced a decline in her ability to do things for herself. Requested CSW to speak to her son. Stated he will assist with making discharge plans. Patient stated she feels she may eventually need LTC. Stated that she is willing to apply for Medicaid. Also requested a way to research SNFs in [facility]. CSW provided patient with a SNF list and encouraged her to look at [email or web address] for SNF rankings. Also requested that patient's son visit some of the SNFs to determine if he liked the facility. Granted CSW to complete a SNF search in [place]. FL2/ PASRR completed and faxed out. CSW will continue to follow and assist.   Employment status:  Retired Nurse, adult PT Recommendations:  [place] / Referral to community resources:  [place]  Patient/Family's Response to care:  Patient is agreeable for SNF placement.   Patient/Family's Understanding of and Emotional Response to Diagnosis, Current Treatment, and Prognosis:  Patient is grateful for CSW's assistance.   Emotional Assessment Appearance:  Appears stated age Attitude/Demeanor/Rapport:   (None) Affect (typically observed):  Calm, Pleasant, Accepting Orientation:  Oriented to Self, Oriented to Place, Oriented to  Time, Oriented to Situation Alcohol / Substance use:  Not Applicable Psych involvement (Current and /or in the community):  No (Comment)  Discharge Needs  Concerns to be addressed:  Discharge Planning Concerns Readmission within the last 30 days:  No Current discharge risk:  Chronically ill Barriers to Discharge:  Continued Medical Work up   [employer], LCSW 07/23/2015, 5:25 PM

## 2015-07-23 NOTE — NC FL2 (Signed)
[place] LEVEL OF CARE SCREENING TOOL     IDENTIFICATION  Patient Name: Gabrielle [name] Birthdate: 09-11-1950 Sex: female Admission Date (Current Location): 07/20/2015  [place] and [place] Number:  Engineering geologist and Address:  [facility], [address], [place], [place] [address]      Provider Number: [id number]  Attending Physician Name and Address:  Loletha Grayer, MD  Relative Name and Phone Number:       Current Level of Care: Hospital Recommended Level of Care: [place] Prior Approval Number:    Date Approved/Denied:   PASRR Number:  ([id number])  Discharge Plan: SNF    Current Diagnoses: Patient Active Problem List   Diagnosis Date Noted  . Hyperkalemia 07/21/2015  . Uncontrolled hypertension 07/21/2015    Orientation RESPIRATION BLADDER Height & Weight     Self, Time, Situation, Place  Normal Continent Weight: (!) 478 lb (216.819 kg) Height:  5\' 6"  (167.6 cm)  BEHAVIORAL SYMPTOMS/MOOD NEUROLOGICAL BOWEL NUTRITION STATUS   (None)  (None) Continent Diet (heart healthy/carb modified)  AMBULATORY STATUS COMMUNICATION OF NEEDS Skin   Extensive Assist Verbally Normal                       Personal Care Assistance Level of Assistance  Feeding, Bathing, Dressing Bathing Assistance: Limited assistance Feeding assistance: Independent Dressing Assistance: Limited assistance     Functional Limitations Info  Sight, Hearing, Speech Sight Info: Impaired Hearing Info: Adequate Speech Info: Adequate    SPECIAL CARE FACTORS FREQUENCY  PT (By licensed PT)     PT Frequency:  (5)              Contractures      Additional Factors Info  Code Status, Allergies, Insulin Sliding Scale Code Status Info:  (Full Code) Allergies Info:  (Lasix, Ultram, Erythromycin, Tetracyclines & Related)   Insulin Sliding Scale Info: insulin glargine (LANTUS) injection 55 Units 55 Units, Subcutaneous, Daily   ; insulin aspart (novoLOG) injection 0-5 Units 0-5 Units, Subcutaneous, Daily at bedtime; insulin aspart (novoLOG) injection 0-20 Units 0-20 Units, Subcutaneous, 3 times daily with meals         Current Medications (07/23/2015):  This is the current hospital active medication list Current Facility-Administered Medications  Medication Dose Route Frequency Provider Last Rate Last Dose  . 0.9 %  sodium chloride infusion  250 mL Intravenous PRN Saundra Shelling, MD      . acetaminophen (TYLENOL) tablet 650 mg  650 mg Oral Q6H PRN Saundra Shelling, MD   650 mg at 07/23/15 1101   Or  . acetaminophen (TYLENOL) suppository 650 mg  650 mg Rectal Q6H PRN Pavan Pyreddy, MD      . cefTRIAXone (ROCEPHIN) 2 g in dextrose 5 % 50 mL IVPB  2 g Intravenous Q24H Crystal G Scarpena, RPH      . cholecalciferol (VITAMIN D) tablet 2,000 Units  2,000 Units Oral Daily Saundra Shelling, MD   2,000 Units at 07/23/15 0928  . enoxaparin (LOVENOX) injection 40 mg  40 mg Subcutaneous Q12H Crystal Jennefer Bravo, RPH   40 mg at 07/23/15 0928  . ethacrynic acid (EDECRIN) 50 mg in dextrose 5 % 50 mL IVPB  50 mg Intravenous Daily Loletha Grayer, MD   50 mg at 07/23/15 1101  . fluticasone (FLONASE) 50 MCG/ACT nasal spray 1 spray  1 spray Each Nare QHS Saundra Shelling, MD   1 spray at 07/22/15 2153  . hydrALAZINE (APRESOLINE) injection  10 mg  10 mg Intravenous Q4H PRN Pavan Pyreddy, MD      . insulin aspart (novoLOG) injection 0-20 Units  0-20 Units Subcutaneous TID WC Saundra Shelling, MD   4 Units at 07/23/15 1221  . insulin aspart (novoLOG) injection 0-5 Units  0-5 Units Subcutaneous QHS Saundra Shelling, MD   0 Units at 07/22/15 0056  . insulin glargine (LANTUS) injection 55 Units  55 Units Subcutaneous Daily Loletha Grayer, MD   55 Units at 07/22/15 1000  . LORazepam (ATIVAN) tablet 0.5 mg  0.5 mg Oral Q8H PRN Loletha Grayer, MD   0.5 mg at 07/23/15 0148  . metoprolol succinate (TOPROL-XL) 24 hr tablet 100 mg  100 mg Oral Daily Saundra Shelling, MD    100 mg at 07/23/15 0928  . neomycin-bacitracin-polymyxin (NEOSPORIN) ointment 1 application  1 application Topical PRN Pavan Pyreddy, MD      . ondansetron (ZOFRAN) tablet 4 mg  4 mg Oral Q6H PRN Saundra Shelling, MD       Or  . ondansetron (ZOFRAN) injection 4 mg  4 mg Intravenous Q6H PRN Saundra Shelling, MD      . patiromer Daryll Drown) packet 25.2 g  25.2 g Oral Daily Munsoor Lateef, MD   25.2 g at 07/23/15 1221  . sodium chloride flush (NS) 0.9 % injection 3 mL  3 mL Intravenous Q12H Pavan Pyreddy, MD   3 mL at 07/21/15 1006  . sodium chloride flush (NS) 0.9 % injection 3 mL  3 mL Intravenous Q12H Pavan Pyreddy, MD   3 mL at 07/23/15 1101  . sodium chloride flush (NS) 0.9 % injection 3 mL  3 mL Intravenous PRN Pavan Pyreddy, MD      . vitamin B-12 (CYANOCOBALAMIN) tablet 1,000 mcg  1,000 mcg Oral Daily Saundra Shelling, MD   1,000 mcg at 07/23/15 [id number]     Discharge Medications: Please see discharge summary for a list of discharge medications.  Relevant Imaging Results:  Relevant Lab Results:   Additional Information  (SSN [id number])  Lorenso Quarry Lochlyn Zullo, LCSW

## 2015-07-23 NOTE — Progress Notes (Signed)
[facility]  ROUNDING NOTE   Subjective:  Potassium slightly down to 5.0. Patient appears to be tolerating patiromer quite well. She remains quite weak however.   Objective:  Vital signs in last 24 hours:  Temp:  [98.2 F (36.8 C)-98.6 F (37 C)] 98.2 F (36.8 C) (06/15 1420) Pulse Rate:  [69-85] 69 (06/15 1420) Resp:  [18-19] 19 (06/15 1420) BP: (117-161)/(59-77) 117/60 mmHg (06/15 1420) SpO2:  [92 %-93 %] 93 % (06/15 1420)  Weight change:  Filed Weights   07/20/15 2204 07/21/15 2045 07/21/15 2341  Weight: 216.819 kg (478 lb) 216.819 kg (478 lb) 216.819 kg (478 lb)    Intake/Output: I/O last 3 completed shifts: In: 29 [P.O.:570] Out: 800 [Urine:800]   Intake/Output this shift:  Total I/O In: 50 [IV Piggyback:50] Out: -   Physical Exam: General: Morbidly obese female  Head: Normocephalic, atraumatic. Moist oral mucosal membranes  Eyes: Anicteric  Neck: Supple, trachea midline  Lungs:  Clear to auscultation, normal effort  Heart: S1S2 no rubs  Abdomen:  Large pannus, soft, NTND   Extremities: 1+ peripheral edema, b/l LE cellulitis  Neurologic: Nonfocal, moving all four extremities  Skin: B/l LE cellulitis       Basic Metabolic Panel:  Recent Labs Lab 07/20/15 2256 07/21/15 0543 07/21/15 1225 07/21/15 2117 07/22/15 0415 07/23/15 0459  NA 137 140 142  --  140 140  K >7.5* 7.4* 7.2*  --  5.1 5.0  CL 108 111 111  --  106 104  CO2 24 26 25   --  27 27  GLUCOSE 177* 108* 108*  --  82 139*  BUN 26* 25* 22*  --  12 13  CREATININE 1.16* 1.14* 1.13*  --  0.83 1.15*  CALCIUM 9.4 9.0 9.4  --  8.9 8.9  PHOS  --   --   --  4.2  --   --     Liver Function Tests:  Recent Labs Lab 07/20/15 2256  AST 20  ALT 18  ALKPHOS 62  BILITOT 1.3*  PROT 8.0  ALBUMIN 4.2   No results for input(s): LIPASE, AMYLASE in the last 168 hours. No results for input(s): AMMONIA in the last 168 hours.  CBC:  Recent Labs Lab 07/20/15 2256 07/21/15 0543   WBC 7.8 9.1  NEUTROABS 6.0  --   HGB 14.0 12.4  HCT 43.3 38.7  MCV 96.3 97.9  PLT 145* 140*    Cardiac Enzymes:  Recent Labs Lab 07/20/15 2256  CKTOTAL 108  TROPONINI <0.03    BNP: Invalid input(s): POCBNP  CBG:  Recent Labs Lab 07/22/15 1143 07/22/15 1703 07/22/15 2046 07/23/15 0722 07/23/15 1159  GLUCAP 81 134* 154* 131* 152*    Microbiology: No results found for this or any previous visit.  Coagulation Studies: No results for input(s): LABPROT, INR in the last 72 hours.  Urinalysis:  Recent Labs  07/20/15 2244 07/22/15 1920  COLORURINE YELLOW* STRAW*  LABSPEC 1.025 1.006  PHURINE 5.0 5.0  GLUCOSEU NEGATIVE NEGATIVE  HGBUR NEGATIVE NEGATIVE  BILIRUBINUR NEGATIVE NEGATIVE  KETONESUR TRACE* TRACE*  PROTEINUR 100* NEGATIVE  NITRITE NEGATIVE NEGATIVE  LEUKOCYTESUR NEGATIVE NEGATIVE      Imaging: Dg Chest 1 View  07/21/2015  CLINICAL DATA:  Central line placement. EXAM: CHEST 1 VIEW COMPARISON:  Chest x-ray dated 07/20/2015 FINDINGS: Right IJ central line has been placed with tip adequately positioned at the level of the mid SVC. No pneumothorax. No pleural effusion seen. Cardiomegaly is stable. Overall cardiomediastinal  silhouette is stable in size and configuration. The interstitial prominence, presumed interstitial edema, appears stable. IMPRESSION: 1. Right IJ central line now in place with tip positioned at the level of the mid SVC. No pneumothorax. 2. Persistent interstitial prominence, stable, presumed mild interstitial edema. Electronically Signed   By: Franki Cabot M.D.   On: 07/21/2015 18:33     Medications:     . cefTRIAXone (ROCEPHIN)  IV  2 g Intravenous Q24H  . cholecalciferol  2,000 Units Oral Daily  . enoxaparin (LOVENOX) injection  40 mg Subcutaneous Q12H  . ethadrynate sodium (EDECRIN) IVPB  50 mg Intravenous Daily  . fluticasone  1 spray Each Nare QHS  . insulin aspart  0-20 Units Subcutaneous TID WC  . insulin aspart  0-5  Units Subcutaneous QHS  . insulin glargine  55 Units Subcutaneous Daily  . metoprolol succinate  100 mg Oral Daily  . patiromer  25.2 g Oral Daily  . sodium chloride flush  3 mL Intravenous Q12H  . sodium chloride flush  3 mL Intravenous Q12H  . vitamin B-12  1,000 mcg Oral Daily   sodium chloride, acetaminophen **OR** acetaminophen, hydrALAZINE, LORazepam, neomycin-bacitracin-polymyxin, ondansetron **OR** ondansetron (ZOFRAN) IV, sodium chloride flush  Assessment/ Plan:  65 y.o. female with a PMHx of osteoarthritis, diabetes mellitus type 2, hypertension, severe morbid obesity, who was admitted to [facility] on 07/20/2015 for evaluation of weakness.   1. Severe hyperkalemia secondary to spironolactone and captopril. 2. Bilateral lower extremity edema with cellulitis. 3. Hypertension.  Plan:  No further indication for dialysis at the moment as potassium has stabilized at 5.0.  Therefore we will discontinue temporary dialysis catheter today. Continue treatment of lower extremity edema with ethydrynate sodium.  Also continue ceftriaxone for lower actually cellulitis.  She will need placement in a rehabilitation facility given her ongoing weakness.  We will continue to monitor along with you.   LOS: 2 Gabrielle [name] 6/15/20173:44 PM

## 2015-07-23 NOTE — Care Management Important Message (Signed)
Important Message  Patient Details  Name: Gabrielle [name] MRN: [id number] Date of Birth: 10-09-1950   Medicare Important Message Given:  Yes    Juliann Pulse A [name] 07/23/2015, 2:29 PM

## 2015-07-23 NOTE — Progress Notes (Signed)
Dr. Holley Raring at bedside now. Ordered to take out temporary dialysis catheter at bedside by RN - same way we remove central lines. Patient eating breakfast now - will come back later to remove line.

## 2015-07-23 NOTE — Progress Notes (Signed)
Right IJ line removed by Gildardo Pounds RN per Dr. Elwyn Lade order. Tolerated well. No complaints. Will continue to monitor.

## 2015-07-24 LAB — LIPID PANEL
CHOL/HDL RATIO: 3.8 ratio
CHOLESTEROL: 130 mg/dL (ref 0–200)
HDL: 34 mg/dL — ABNORMAL LOW (ref >40)
LDL CALC: 79 mg/dL (ref 0–99)
TRIGLYCERIDES: 84 mg/dL (ref <150)
VLDL: 17 mg/dL (ref 0–40)

## 2015-07-24 LAB — GLUCOSE, CAPILLARY
GLUCOSE-CAPILLARY: 137 mg/dL — AB (ref 65–99)
Glucose-Capillary: 116 mg/dL — ABNORMAL HIGH (ref 65–99)
Glucose-Capillary: 181 mg/dL — ABNORMAL HIGH (ref 65–99)
Glucose-Capillary: 129 mg/dL — ABNORMAL HIGH (ref 65–99)

## 2015-07-24 LAB — TSH: TSH: 7.679 u[IU]/mL — ABNORMAL HIGH (ref 0.350–4.500)

## 2015-07-24 LAB — BASIC METABOLIC PANEL
Anion gap: 12 (ref 5–15)
BUN: 14 mg/dL (ref 6–20)
CHLORIDE: 99 mmol/L — AB (ref 101–111)
CO2: 27 mmol/L (ref 22–32)
CREATININE: 1.18 mg/dL — AB (ref 0.44–1.00)
Calcium: 9.1 mg/dL (ref 8.9–10.3)
GFR calc Af Amer: 56 mL/min — ABNORMAL LOW (ref >60)
GFR calc non Af Amer: 48 mL/min — ABNORMAL LOW (ref >60)
Glucose, Bld: 130 mg/dL — ABNORMAL HIGH (ref 65–99)
Potassium: 4.3 mmol/L (ref 3.5–5.1)
Sodium: 138 mmol/L (ref 135–145)

## 2015-07-24 MED ORDER — INSULIN GLARGINE 100 UNIT/ML ~~LOC~~ SOLN
50.0000 [IU] | Freq: Every day | SUBCUTANEOUS | Status: DC
  Filled 2015-07-24 (×5): qty 0.5

## 2015-07-24 NOTE — Progress Notes (Signed)
Patient ID: Gabrielle [name], female   DOB: 12-09-1950, 65 y.o.   MRN: [id number] [employer] PROGRESS NOTE  Jaya Top [id number] DOB: 11-28-1950 DOA: 07/20/2015 PCP: Alvester Chou, NP  HPI/Subjective: Patient had a rough night, chest pains and palpitaions. Did not sleep well. Still with a lot of fluid weight on her  Objective: Filed Vitals:   07/24/15 0434 07/24/15 0730  BP: 131/59 131/54  Pulse: 79 84  Temp: 98 F (36.7 C) 98.2 F (36.8 C)  Resp: 20 16    Filed Weights   07/20/15 2204 07/21/15 2045 07/21/15 2341  Weight: 216.819 kg (478 lb) 216.819 kg (478 lb) 216.819 kg (478 lb)    ROS: Review of Systems  Constitutional: Negative for fever and chills.  Eyes: Negative for blurred vision.  Respiratory: Negative for cough and shortness of breath.   Cardiovascular: Positive for chest pain and palpitations.  Gastrointestinal: Negative for nausea, vomiting, abdominal pain, diarrhea and constipation.  Genitourinary: Negative for dysuria.  Musculoskeletal: Negative for joint pain.  Neurological: Positive for weakness. Negative for dizziness and headaches.   Exam: Physical Exam  Constitutional: She is oriented to person, place, and time.  HENT:  Nose: No mucosal edema.  Mouth/Throat: No oropharyngeal exudate or posterior oropharyngeal edema.  Eyes: Conjunctivae, EOM and lids are normal. Pupils are equal, round, and reactive to light.  Neck: No JVD present. Carotid bruit is not present. No edema present. No thyroid mass and no thyromegaly present.  Cardiovascular: S1 normal and S2 normal.  Exam reveals no gallop.   No murmur heard. Respiratory: No respiratory distress. She has no wheezes. She has no rhonchi. She has no rales.  GI: Soft. Bowel sounds are normal. There is no tenderness.  Musculoskeletal:       Right ankle: She exhibits swelling.       Left ankle: She exhibits swelling.  Lymphadenopathy:    She has no cervical adenopathy.  Neurological: She is alert  and oriented to person, place, and time. No cranial nerve deficit.  Skin: Skin is warm. Nails show no clubbing.  Erythema still red and the inner thighs. The lower leg has circumferential erythema. That is starting to fade and be less warm.  Psychiatric: She has a normal mood and affect.      Data Reviewed: Basic Metabolic Panel:  Recent Labs Lab 07/21/15 0543 07/21/15 1225 07/21/15 2117 07/22/15 0415 07/23/15 0459 07/24/15 0643  NA 140 142  --  140 140 138  K 7.4* 7.2*  --  5.1 5.0 4.3  CL 111 111  --  106 104 99*  CO2 26 25  --  27 27 27   GLUCOSE 108* 108*  --  82 139* 130*  BUN 25* 22*  --  12 13 14   CREATININE 1.14* 1.13*  --  0.83 1.15* 1.18*  CALCIUM 9.0 9.4  --  8.9 8.9 9.1  PHOS  --   --  4.2  --   --   --    Liver Function Tests:  Recent Labs Lab 07/20/15 2256  AST 20  ALT 18  ALKPHOS 62  BILITOT 1.3*  PROT 8.0  ALBUMIN 4.2   CBC:  Recent Labs Lab 07/20/15 2256 07/21/15 0543  WBC 7.8 9.1  NEUTROABS 6.0  --   HGB 14.0 12.4  HCT 43.3 38.7  MCV 96.3 97.9  PLT 145* 140*   Cardiac Enzymes:  Recent Labs Lab 07/20/15 2256  CKTOTAL 108  TROPONINI <0.03    CBG:  Recent Labs  Lab 07/23/15 0722 07/23/15 1159 07/23/15 1653 07/23/15 2100 07/24/15 0720  GLUCAP 131* 152* 158* 133* 116*      Scheduled Meds: . cefTRIAXone (ROCEPHIN)  IV  2 g Intravenous Q24H  . cholecalciferol  2,000 Units Oral Daily  . enoxaparin (LOVENOX) injection  40 mg Subcutaneous Q12H  . ethadrynate sodium (EDECRIN) IVPB  50 mg Intravenous Daily  . fluticasone  1 spray Each Nare QHS  . insulin aspart  0-20 Units Subcutaneous TID WC  . insulin aspart  0-5 Units Subcutaneous QHS  . insulin glargine  50 Units Subcutaneous Daily  . metoprolol succinate  100 mg Oral Daily  . sodium chloride flush  3 mL Intravenous Q12H  . sodium chloride flush  3 mL Intravenous Q12H  . vitamin B-12  1,000 mcg Oral Daily    Assessment/Plan:  1. Life threatening hyperkalemia. Now  has resolved. Likely brought on by spironolactone and captopril 2. Bilateral lower extremity cellulitis started IV Rocephin. Since the cellulitis is still in the inner thigh and bilateral lower leg would probably benefit from a few more days of IV antibiotics. 3. Severe weakness. Physical therapy recommended rehabilitation. Likely on Monday 4. morbid obesity.  Weight loss needed 5. Type 2 diabetes.  Lantus and sliding scale 6. Essential hypertension continue metoprolol 7. Anasarca and severe Edema- iv edecrin. Patient will benefit from a few more days of IV diuresis. 8. Chest pain and palpitations. Patient states is because she is off the captopril. I will obtain an echocardiogram. Continue metoprolol.   Code Status:     Code Status Orders        Start     Ordered   07/21/15 0342  Full code   Continuous     07/21/15 0341    Code Status History    Date Active Date Inactive Code Status Order ID Comments User Context   This patient has a current code status but no historical code status.     Disposition Plan:  Likely out to rehabilitation On Monday.   Consultants:   nephrology  Time spent:  28 minutes  Loletha Grayer  [employer]

## 2015-07-24 NOTE — Progress Notes (Signed)
Physical Therapy Treatment Patient Details Name: Quetzal Lagle MRN: [id number] DOB: [birth year] Today's Date: 07/24/2015    History of Present Illness Patient is a 65 y/o female that presents with increasing weakness. Noted to have K+ over 7.5 initially, currently 5.1 after dialysis. Now has a temporary dialysis catheter in her R IJ placed on this admission.     PT Comments    RIJ cath has been removed.  Pt in bed ready for therapy.  Very motivated to get out of bed today and stand.  Pt incontinent of urine and needed +4 for rolling, care and bed change.  She was able to get to edge of bed with +4 assist.  Once sitting at edge of bed she was able to maintain her balance well with only le support.  Bed was placed close to sink so that she could hold onto the rim/cabinet for support.  She felt that this most closely simulated her home environment and allow for the most success.  She was able to stand with min a x 2 and +2 standby for safety along with increased time to achieve and upright position and reposition her feet.  She was able to maintain standing for 2-3 minutes.  She sat with good control and after a short rest was able to attempt again. She stood with less assist and was able to practice some weight shifting and some stepping in place with decreased step height.  She was able to sidestep to the left about 2' to put he in a better position in bed.  She required +4 assist to return to supine and reposition to comfort.  At end of treatment, bed was placed in the chair position to allow pt a more upright position.  Pt very pleased with her progress today.     Follow Up Recommendations  SNF     Equipment Recommendations       Recommendations for Other Services       Precautions / Restrictions Precautions Precautions: Fall Restrictions Weight Bearing Restrictions: No    Mobility  Bed Mobility Overal bed mobility: +2 for physical assistance;Needs Assistance Bed Mobility: Supine to  Sit;Sit to Supine;Rolling Rolling: +2 for physical assistance;Max assist   Supine to sit: +2 for physical assistance;Max assist Sit to supine: +2 for physical assistance;Max assist   General bed mobility comments: heavy assist for bed mobility skills  Transfers Overall transfer level: Needs assistance Equipment used:  (bed pushed toward sink so she could pull up) Transfers: Sit to/from Stand Sit to Stand: Min assist;+2 physical assistance;+2 safety/equipment         General transfer comment: pt did well using sink for support.  stood x 2  Ambulation/Gait Ambulation/Gait assistance: Min assist;+2 physical assistance;+2 safety/equipment (side stepping using sink for support.) Ambulation Distance (Feet): 2 Feet Assistive device:  (sink/cabinet for support) Gait Pattern/deviations: Step-to pattern   Gait velocity interpretation: <1.8 ft/sec, indicative of risk for recurrent falls General Gait Details: stepped sideways to get higher up in bed   Stairs            Wheelchair Mobility    Modified Rankin (Stroke Patients Only)       Balance Overall balance assessment: Needs assistance Sitting-balance support: Feet supported Sitting balance-Leahy Scale: Good     Standing balance support: Bilateral upper extremity supported Standing balance-Leahy Scale: Poor                      Cognition Arousal/Alertness: Awake/alert Behavior During  Therapy: WFL for tasks assessed/performed Overall Cognitive Status: Within Functional Limits for tasks assessed                      Exercises      General Comments        Pertinent Vitals/Pain Pain Assessment: No/denies pain    Home Living                      Prior Function            PT Goals (current goals can now be found in the care plan section) Acute Rehab PT Goals Patient Stated Goal: to stand today Progress towards PT goals: Progressing toward goals    Frequency  Min 2X/week     PT Plan Current plan remains appropriate    Co-evaluation             End of Session Equipment Utilized During Treatment: Gait belt Activity Tolerance: Patient tolerated treatment well;Patient limited by fatigue Patient left: in bed;with call bell/phone within reach;with nursing/sitter in room;with family/visitor present     Time: 1035-1100 PT Time Calculation (min) (ACUTE ONLY): 25 min  Charges:  $Therapeutic Activity: 23-37 mins                    G CodesChesley Noon, PTA 07/24/2015, 12:05 PM

## 2015-07-24 NOTE — Progress Notes (Signed)
CSW met with patient and her son. Presented bed offers. Accepted bed at [facility]. CSW contacted Neoma Laming in admissions at [facility] and informed her of above. CSW will continue to follow and assist.  Ernest Pine, MSW, [place] Work Department [phone]

## 2015-07-24 NOTE — Progress Notes (Signed)
[facility]  ROUNDING NOTE   Subjective:  Patient seen in follow-up today. Potassium down to 4.3. Patient's son is also at the bedside today.   Objective:  Vital signs in last 24 hours:  Temp:  [98 F (36.7 C)-98.7 F (37.1 C)] 98.7 F (37.1 C) (06/16 1136) Pulse Rate:  [69-85] 76 (06/16 1136) Resp:  [16-20] 19 (06/16 1136) BP: (117-150)/(42-60) 150/42 mmHg (06/16 1136) SpO2:  [92 %-96 %] 96 % (06/16 1136)  Weight change:  Filed Weights   07/20/15 2204 07/21/15 2045 07/21/15 2341  Weight: 216.819 kg (478 lb) 216.819 kg (478 lb) 216.819 kg (478 lb)    Intake/Output: I/O last 3 completed shifts: In: 17 [IV Piggyback:50] Out: 60 [Urine:1100]   Intake/Output this shift:  Total I/O In: 240 [P.O.:240] Out: -   Physical Exam: General: Morbidly obese female  Head: Normocephalic, atraumatic. Moist oral mucosal membranes  Eyes: Anicteric  Neck: Supple, trachea midline  Lungs:  Clear to auscultation, normal effort  Heart: S1S2 no rubs  Abdomen:  Large pannus, soft, NTND   Extremities: 1+ peripheral edema, b/l LE cellulitis  Neurologic: Nonfocal, moving all four extremities  Skin: B/l LE cellulitis       Basic Metabolic Panel:  Recent Labs Lab 07/21/15 0543 07/21/15 1225 07/21/15 2117 07/22/15 0415 07/23/15 0459 07/24/15 0643  NA 140 142  --  140 140 138  K 7.4* 7.2*  --  5.1 5.0 4.3  CL 111 111  --  106 104 99*  CO2 26 25  --  27 27 27   GLUCOSE 108* 108*  --  82 139* 130*  BUN 25* 22*  --  12 13 14   CREATININE 1.14* 1.13*  --  0.83 1.15* 1.18*  CALCIUM 9.0 9.4  --  8.9 8.9 9.1  PHOS  --   --  4.2  --   --   --     Liver Function Tests:  Recent Labs Lab 07/20/15 2256  AST 20  ALT 18  ALKPHOS 62  BILITOT 1.3*  PROT 8.0  ALBUMIN 4.2   No results for input(s): LIPASE, AMYLASE in the last 168 hours. No results for input(s): AMMONIA in the last 168 hours.  CBC:  Recent Labs Lab 07/20/15 2256 07/21/15 0543  WBC 7.8 9.1   NEUTROABS 6.0  --   HGB 14.0 12.4  HCT 43.3 38.7  MCV 96.3 97.9  PLT 145* 140*    Cardiac Enzymes:  Recent Labs Lab 07/20/15 2256  CKTOTAL 108  TROPONINI <0.03    BNP: Invalid input(s): POCBNP  CBG:  Recent Labs Lab 07/23/15 1159 07/23/15 1653 07/23/15 2100 07/24/15 0720 07/24/15 1146  GLUCAP 152* 158* 133* 116* 181*    Microbiology: No results found for this or any previous visit.  Coagulation Studies: No results for input(s): LABPROT, INR in the last 72 hours.  Urinalysis:  Recent Labs  07/22/15 1920  COLORURINE STRAW*  LABSPEC 1.006  PHURINE 5.0  GLUCOSEU NEGATIVE  HGBUR NEGATIVE  BILIRUBINUR NEGATIVE  KETONESUR TRACE*  PROTEINUR NEGATIVE  NITRITE NEGATIVE  LEUKOCYTESUR NEGATIVE      Imaging: No results found.   Medications:     . cefTRIAXone (ROCEPHIN)  IV  2 g Intravenous Q24H  . cholecalciferol  2,000 Units Oral Daily  . enoxaparin (LOVENOX) injection  40 mg Subcutaneous Q12H  . ethadrynate sodium (EDECRIN) IVPB  50 mg Intravenous Daily  . fluticasone  1 spray Each Nare QHS  . insulin aspart  0-20 Units Subcutaneous  TID WC  . insulin aspart  0-5 Units Subcutaneous QHS  . insulin glargine  50 Units Subcutaneous Daily  . metoprolol succinate  100 mg Oral Daily  . sodium chloride flush  3 mL Intravenous Q12H  . sodium chloride flush  3 mL Intravenous Q12H  . vitamin B-12  1,000 mcg Oral Daily   sodium chloride, acetaminophen **OR** acetaminophen, hydrALAZINE, LORazepam, neomycin-bacitracin-polymyxin, ondansetron **OR** ondansetron (ZOFRAN) IV, sodium chloride flush  Assessment/ Plan:  65 y.o. female with a PMHx of osteoarthritis, diabetes mellitus type 2, hypertension, severe morbid obesity, who was admitted to [facility] on 07/20/2015 for evaluation of weakness.   1. Severe hyperkalemia secondary to spironolactone and captopril. 2. Bilateral lower extremity edema with cellulitis. 3. Hypertension.  Plan:  The patient's potassium is  now down to 4.3. She is now off of captopril as well as patiromer.  She remains on diuretic therapy as well as antibiotic with ceftriaxone for her underlying cellulitis. Further management per Dr. Earleen Newport.  No further renal input at this point in time. Therefore we will sign off.   LOS: 3 Jin Shockley 6/16/20171:36 PM

## 2015-07-24 NOTE — Progress Notes (Signed)
Patient's son stated that he did not like [facility] after visiting. Requested CSW send bed search to [place], [place], [place], [place], [place]. CSW sent referrals to the facilities. CSW will continue to follow and assist.   Ernest Pine, MSW, [place] Work Department [phone]

## 2015-07-25 ENCOUNTER — Inpatient Hospital Stay (HOSPITAL_COMMUNITY)
Admit: 2015-07-25 | Discharge: 2015-07-25 | Disposition: A | Discharge status: Home / Self Care | Payer: Medicare Other | Attending: Internal Medicine | Admitting: Internal Medicine

## 2015-07-25 DIAGNOSIS — R002 Palpitations: Secondary | ICD-10-CM

## 2015-07-25 LAB — BASIC METABOLIC PANEL
Anion gap: 15 (ref 5–15)
BUN: 13 mg/dL (ref 6–20)
CHLORIDE: 97 mmol/L — AB (ref 101–111)
CO2: 29 mmol/L (ref 22–32)
CREATININE: 1.11 mg/dL — AB (ref 0.44–1.00)
Calcium: 9.2 mg/dL (ref 8.9–10.3)
GFR, EST NON AFRICAN AMERICAN: 52 mL/min — AB (ref >60)
Glucose, Bld: 124 mg/dL — ABNORMAL HIGH (ref 65–99)
POTASSIUM: 3.9 mmol/L (ref 3.5–5.1)
SODIUM: 141 mmol/L (ref 135–145)

## 2015-07-25 LAB — ECHOCARDIOGRAM COMPLETE
Ao-asc: 32 cm
HEIGHTINCHES: 66 in
TVMG: 312 mmHg
Weight: 7647.99 oz

## 2015-07-25 LAB — GLUCOSE, CAPILLARY
GLUCOSE-CAPILLARY: 126 mg/dL — AB (ref 65–99)
GLUCOSE-CAPILLARY: 170 mg/dL — AB (ref 65–99)
GLUCOSE-CAPILLARY: 150 mg/dL — AB (ref 65–99)
Glucose-Capillary: 151 mg/dL — ABNORMAL HIGH (ref 65–99)

## 2015-07-25 NOTE — Progress Notes (Signed)
Patient ID: Gabrielle [name], female   DOB: September 09, 1950, 65 y.o.   MRN: [id number] [employer] PROGRESS NOTE  Ariahnna Fasick [id number] DOB: [birth year] DOA: 07/20/2015 PCP: Alvester Chou, NP  HPI/Subjective: Says  She  feels much better today. Less shortness of breath. Leg cellulitis is improving. Says IV diuretic is helping.  Objective: Filed Vitals:   07/24/15 1938 07/25/15 0456  BP: 155/69 157/70  Pulse: 79 90  Temp:  97.5 F (36.4 C)  Resp:  16    Filed Weights   07/20/15 2204 07/21/15 2045 07/21/15 2341  Weight: 216.819 kg (478 lb) 216.819 kg (478 lb) 216.819 kg (478 lb)    ROS: Review of Systems  Constitutional: Negative for fever and chills.  Eyes: Negative for blurred vision.  Respiratory: Negative for cough and shortness of breath.   Cardiovascular: Negative for palpitations.  Gastrointestinal: Negative for nausea, vomiting, abdominal pain, diarrhea and constipation.  Genitourinary: Negative for dysuria.  Musculoskeletal: Negative for joint pain.  Neurological: Positive for weakness. Negative for dizziness and headaches.   Exam: Physical Exam  Constitutional: She is oriented to person, place, and time.  HENT:  Nose: No mucosal edema.  Mouth/Throat: No oropharyngeal exudate or posterior oropharyngeal edema.  Eyes: Conjunctivae, EOM and lids are normal. Pupils are equal, round, and reactive to light.  Neck: No JVD present. Carotid bruit is not present. No edema present. No thyroid mass and no thyromegaly present.  Cardiovascular: S1 normal and S2 normal.  Exam reveals no gallop.   No murmur heard. Respiratory: No respiratory distress. She has no wheezes. She has no rhonchi. She has no rales.  GI: Soft. Bowel sounds are normal. There is no tenderness.  Musculoskeletal:       Right ankle: She exhibits swelling.       Left ankle: She exhibits swelling.  Lymphadenopathy:    She has no cervical adenopathy.  Neurological: She is alert and oriented to person,  place, and time. No cranial nerve deficit.  Skin: Skin is warm. Nails show no clubbing.  Erythema still red and the inner thighs. The lower leg has circumferential erythema. That is starting to fade and be less warm.  Psychiatric: She has a normal mood and affect.      Data Reviewed: Basic Metabolic Panel:  Recent Labs Lab 07/21/15 1225 07/21/15 2117 07/22/15 0415 07/23/15 0459 07/24/15 0643 07/25/15 0601  NA 142  --  140 140 138 141  K 7.2*  --  5.1 5.0 4.3 3.9  CL 111  --  106 104 99* 97*  CO2 25  --  27 27 27 29   GLUCOSE 108*  --  82 139* 130* 124*  BUN 22*  --  12 13 14 13   CREATININE 1.13*  --  0.83 1.15* 1.18* 1.11*  CALCIUM 9.4  --  8.9 8.9 9.1 9.2  PHOS  --  4.2  --   --   --   --    Liver Function Tests:  Recent Labs Lab 07/20/15 2256  AST 20  ALT 18  ALKPHOS 62  BILITOT 1.3*  PROT 8.0  ALBUMIN 4.2   CBC:  Recent Labs Lab 07/20/15 2256 07/21/15 0543  WBC 7.8 9.1  NEUTROABS 6.0  --   HGB 14.0 12.4  HCT 43.3 38.7  MCV 96.3 97.9  PLT 145* 140*   Cardiac Enzymes:  Recent Labs Lab 07/20/15 2256  CKTOTAL 108  TROPONINI <0.03    CBG:  Recent Labs Lab 07/24/15 0720 07/24/15 1146 07/24/15 1629  07/24/15 2125 07/25/15 0719  GLUCAP 116* 181* 137* 129* 126*      Scheduled Meds: . cefTRIAXone (ROCEPHIN)  IV  2 g Intravenous Q24H  . cholecalciferol  2,000 Units Oral Daily  . enoxaparin (LOVENOX) injection  40 mg Subcutaneous Q12H  . ethadrynate sodium (EDECRIN) IVPB  50 mg Intravenous Daily  . fluticasone  1 spray Each Nare QHS  . insulin aspart  0-20 Units Subcutaneous TID WC  . insulin aspart  0-5 Units Subcutaneous QHS  . insulin glargine  50 Units Subcutaneous Daily  . metoprolol succinate  100 mg Oral Daily  . sodium chloride flush  3 mL Intravenous Q12H  . sodium chloride flush  3 mL Intravenous Q12H  . vitamin B-12  1,000 mcg Oral Daily    Assessment/Plan:  1. Life threatening hyperkalemia. Now has resolved. Likely brought  on by spironolactone and captopril, Potassium down to 3.9. 7.5 on admission. 2. Bilateral lower extremity cellulitis started IV Rocephin.   Improving., continue IV antibiotics.  3.  Severe weakness. Physical therapy recommended rehabilitation. Likely on Monday 4. morbid obesity.  Weight loss needed 5. Type 2 diabetes.  Lantus and sliding scale 6. Essential hypertension continue metoprolol 7. Anasarca and severe Edema- iv edecrin. Continue IV diuretics today also. Chest pain and palpitations. Improved. echocardiogram is ordered, follow the results.  Code Status:     Code Status Orders        Start     Ordered   07/21/15 0342  Full code   Continuous     07/21/15 0341    Code Status History    Date Active Date Inactive Code Status Order ID Comments User Context   This patient has a current code status but no historical code status.     Disposition Plan:  Likely out to rehabilitation On Monday.   Consultants:   nephrology  Time spent:  28 minutes  [employer]

## 2015-07-25 NOTE — Progress Notes (Signed)
Clinical Education officer, museum (CSW) received a call from patient's son Gabrielle [name] inquiring about other bed offers. CSW made son aware that only bed offers were the ones he was given yesterday. Son also asked about the [facility] [place] explained that the [facility] is an inpatient rehab center, which is a different level of care then a skilled nursing facility. CSW explained that patient does not meet criteria for inpatient rehab and PT is recommending SNF. CSW explained to son that when patient is discharged he will have to chose a bed that is available because patient's insurance will stop paying for her to be at the hospital. Son verbalized his understanding. CSW will continue to follow and assist as needed.   [name], LCSW [phone]

## 2015-07-25 NOTE — Progress Notes (Signed)
Physical Therapy Treatment Patient Details Name: Gabrielle [name] MRN: [id number] DOB: Dec 10, 1950 Today's Date: 07/25/2015    History of Present Illness Patient is a 65 y/o female that presents with increasing weakness. Noted to have K+ over 7.5 initially, currently 5.1 after dialysis. Now has a temporary dialysis catheter in her R IJ placed on this admission.     PT Comments    Pt is very eager to work with PT and highly motivated.  She initially wanted to try "walking" (turning) with walker but we finally agreed that given her weakness and limitations we should stick with the safer option of walking along EOB using counter top.  She is able to get to sitting with less assist today, but after walking/side stepping she starts to feel "not right" and needed to sit.  Her BP was 226/82, nursing notified and 2 PTs and nursing got her back to supine and adjusted to bed.  Pt did well with what she was able to do but ended up being limited secondary to vitals.    Follow Up Recommendations  SNF     Equipment Recommendations       Recommendations for Other Services       Precautions / Restrictions Precautions Precautions: Fall Restrictions Weight Bearing Restrictions: No    Mobility  Bed Mobility Overal bed mobility: +2 for physical assistance Bed Mobility: Supine to Sit;Sit to Supine;Rolling     Supine to sit: +2 for physical assistance;Mod assist Sit to supine: Max assist (+3)   General bed mobility comments: Pt shows good effort in getting up to sitting, needs a lot of assist to get back into supine  Transfers Overall transfer level: Needs assistance Equipment used:  (using sink/counter top) Transfers: Sit to/from Stand Sit to Stand: Min assist         General transfer comment: Raised bed only ~1 inch to assist with rising to standing.  She did well and ultimately did not need much assist at all.    Ambulation/Gait Ambulation/Gait assistance: Min guard Ambulation Distance  (Feet): 5 Feet Assistive device:  (counter top)       General Gait Details: Pt able to side steps ~2 ft to the R, needed a rest break and then cam ~3 ft back to the L.  She started feeling generally "bad" and lightheaded and requested to sit back down.  Ended up checking her BP and it was elevated to 226/83, O2 remained in the 90s.   Stairs            Wheelchair Mobility    Modified Rankin (Stroke Patients Only)       Balance                                    Cognition Arousal/Alertness: Awake/alert Behavior During Therapy: WFL for tasks assessed/performed Overall Cognitive Status: Within Functional Limits for tasks assessed                      Exercises      General Comments        Pertinent Vitals/Pain Pain Assessment: No/denies pain    Home Living                      Prior Function            PT Goals (current goals can now be found in the care plan  section) Progress towards PT goals: Progressing toward goals    Frequency  Min 2X/week    PT Plan Current plan remains appropriate    Co-evaluation             End of Session Equipment Utilized During Treatment: Gait belt Activity Tolerance: Patient tolerated treatment well;Patient limited by fatigue (elevated BP with "prolonged" standing) Patient left: in bed;with nursing/sitter in room     Time: [id number] PT Time Calculation (min) (ACUTE ONLY): 28 min  Charges:  $Gait Training: 8-22 mins $Therapeutic Activity: 8-22 mins                    G Codes:      Kreg Shropshire, DPT 07/25/2015, 4:48 PM

## 2015-07-25 NOTE — Progress Notes (Signed)
*  PRELIMINARY RESULTS* Echocardiogram 2D Echocardiogram has been performed.  Gabrielle [name] 07/25/2015, 1:35 PM

## 2015-07-26 LAB — GLUCOSE, CAPILLARY
GLUCOSE-CAPILLARY: 140 mg/dL — AB (ref 65–99)
Glucose-Capillary: 138 mg/dL — ABNORMAL HIGH (ref 65–99)
Glucose-Capillary: 180 mg/dL — ABNORMAL HIGH (ref 65–99)
Glucose-Capillary: 188 mg/dL — ABNORMAL HIGH (ref 65–99)
Glucose-Capillary: 188 mg/dL — ABNORMAL HIGH (ref 65–99)

## 2015-07-26 MED ORDER — HYDROCORTISONE 0.5 % EX CREA
TOPICAL_CREAM | Freq: Two times a day (BID) | CUTANEOUS | Status: DC
  Administered 2015-07-26 – 2015-07-27 (×3): via TOPICAL
  Filled 2015-07-26: qty 28.35

## 2015-07-26 MED ORDER — HYDRALAZINE HCL 25 MG PO TABS
25.0000 mg | ORAL_TABLET | Freq: Three times a day (TID) | ORAL | Status: DC
  Administered 2015-07-26 – 2015-07-27 (×4): 25 mg via ORAL
  Filled 2015-07-26 (×4): qty 1

## 2015-07-26 MED ORDER — ETHACRYNIC ACID 25 MG PO TABS
50.0000 mg | ORAL_TABLET | Freq: Every day | ORAL | Status: DC
  Administered 2015-07-26 – 2015-07-27 (×2): 50 mg via ORAL
  Filled 2015-07-26 (×3): qty 2

## 2015-07-26 NOTE — Progress Notes (Signed)
Patient ID: Gabrielle [name], female   DOB: October 09, 1950, 65 y.o.   MRN: [id number] [employer] PROGRESS NOTE  Gabrielle [name] [id number] DOB: [birth year] DOA: 07/20/2015 PCP: Alvester Chou, NP  HPI/Subjective: Feels burning in the legs. Also blood pressure was elevated to 123456 systolic when she was working with physical therapy. She did not feel good that time.  Objective: Filed Vitals:   07/26/15 0557 07/26/15 0846  BP: 159/63 167/76  Pulse: 81 88  Temp: 97.6 F (36.4 C)   Resp: 18     Filed Weights   07/20/15 2204 07/21/15 2045 07/21/15 2341  Weight: 216.819 kg (478 lb) 216.819 kg (478 lb) 216.819 kg (478 lb)    ROS: Review of Systems  Constitutional: Negative for fever and chills.  Eyes: Negative for blurred vision.  Respiratory: Negative for cough and shortness of breath.   Cardiovascular: Negative for palpitations.  Gastrointestinal: Negative for nausea, vomiting, abdominal pain, diarrhea and constipation.  Genitourinary: Negative for dysuria.  Musculoskeletal: Negative for joint pain.  Neurological: Positive for weakness. Negative for dizziness and headaches.   Exam: Physical Exam  Constitutional: She is oriented to person, place, and time.  HENT:  Nose: No mucosal edema.  Mouth/Throat: No oropharyngeal exudate or posterior oropharyngeal edema.  Eyes: Conjunctivae, EOM and lids are normal. Pupils are equal, round, and reactive to light.  Neck: No JVD present. Carotid bruit is not present. No edema present. No thyroid mass and no thyromegaly present.  Cardiovascular: S1 normal and S2 normal.  Exam reveals no gallop.   No murmur heard. Respiratory: No respiratory distress. She has no wheezes. She has no rhonchi. She has no rales.  GI: Soft. Bowel sounds are normal. There is no tenderness.  Musculoskeletal:       Right ankle: She exhibits swelling.       Left ankle: She exhibits swelling.  Lymphadenopathy:    She has no cervical adenopathy.  Neurological: She is  alert and oriented to person, place, and time. No cranial nerve deficit.  Skin: Skin is warm. Nails show no clubbing.  Erythema still red and the inner thighs. The lower leg has circumferential erythema. That is starting to fade and be less warm.  Psychiatric: She has a normal mood and affect.      Data Reviewed: Basic Metabolic Panel:  Recent Labs Lab 07/21/15 1225 07/21/15 2117 07/22/15 0415 07/23/15 0459 07/24/15 0643 07/25/15 0601  NA 142  --  140 140 138 141  K 7.2*  --  5.1 5.0 4.3 3.9  CL 111  --  106 104 99* 97*  CO2 25  --  27 27 27 29   GLUCOSE 108*  --  82 139* 130* 124*  BUN 22*  --  12 13 14 13   CREATININE 1.13*  --  0.83 1.15* 1.18* 1.11*  CALCIUM 9.4  --  8.9 8.9 9.1 9.2  PHOS  --  4.2  --   --   --   --    Liver Function Tests:  Recent Labs Lab 07/20/15 2256  AST 20  ALT 18  ALKPHOS 62  BILITOT 1.3*  PROT 8.0  ALBUMIN 4.2   CBC:  Recent Labs Lab 07/20/15 2256 07/21/15 0543  WBC 7.8 9.1  NEUTROABS 6.0  --   HGB 14.0 12.4  HCT 43.3 38.7  MCV 96.3 97.9  PLT 145* 140*   Cardiac Enzymes:  Recent Labs Lab 07/20/15 2256  CKTOTAL 108  TROPONINI <0.03    CBG:  Recent Labs Lab  07/25/15 1155 07/25/15 1711 07/25/15 2111 07/26/15 0554 07/26/15 0735  GLUCAP 170* 151* 150* 138* 140*      Scheduled Meds: . cefTRIAXone (ROCEPHIN)  IV  2 g Intravenous Q24H  . cholecalciferol  2,000 Units Oral Daily  . enoxaparin (LOVENOX) injection  40 mg Subcutaneous Q12H  . ethadrynate sodium (EDECRIN) IVPB  50 mg Intravenous Daily  . fluticasone  1 spray Each Nare QHS  . hydrALAZINE  25 mg Oral Q8H  . insulin aspart  0-20 Units Subcutaneous TID WC  . insulin aspart  0-5 Units Subcutaneous QHS  . insulin glargine  50 Units Subcutaneous Daily  . metoprolol succinate  100 mg Oral Daily  . sodium chloride flush  3 mL Intravenous Q12H  . sodium chloride flush  3 mL Intravenous Q12H  . vitamin B-12  1,000 mcg Oral Daily     Assessment/Plan:  1. Life threatening hyperkalemia. Now has resolved. Likely brought on by spironolactone and captopril, Potassium down to 3.9. 7.5 on admission. 2. Bilateral lower extremity cellulitis started IV Rocephin.   Improving., continue IV antibiotics.He clustered hydrocortisone for the legs for a burning, she uses that at home..  3.  Severe weakness. Physical therapy recommended rehabilitation. Discharge to [facility] tomorrow  4. morbid obesity. .  Weight loss needed 5. Type 2 diabetes.  Lantus and sliding scale 6. Essential hypertension continue metoprolol, add hydralazine because blood pressure is elevated yesterday when she was working with physical therapy, use small dose of hydralazine 25 mg 3 times a day.  Anasarca;start po Edecrin Palpitations;echo showed EF more than 75%with mild pulmonary HTN;d/w the results with patient/ Code Status:     Code Status Orders        Start     Ordered   07/21/15 0342  Full code   Continuous     07/21/15 0341    Code Status History    Date Active Date Inactive Code Status Order ID Comments User Context   This patient has a current code status but no historical code status.     Disposition Plan:  Likely out to rehabilitation On Monday.   Consultants:   nephrology  Time spent:  28 minutes  [employer]

## 2015-07-26 NOTE — Progress Notes (Signed)
PT Cancellation Note  Patient Details Name: Gabrielle [name] MRN: [id number] DOB: [birth year]   Cancelled Treatment:    Reason Eval/Treat Not Completed: Medical issues which prohibited therapy. Pt's chart reviewed. Her BP became severely elevated yesterday during PT, 226/82. Her current systolic BP continues to be elevated and is not appropriate to participate in therapy at this time. PT will f/u and resume therapy when appropriate.   Neoma Laming, PT, DPT  07/26/2015, 12:33 PM [phone]

## 2015-07-27 LAB — BASIC METABOLIC PANEL
Anion gap: 16 — ABNORMAL HIGH (ref 5–15)
BUN: 17 mg/dL (ref 6–20)
CALCIUM: 9.1 mg/dL (ref 8.9–10.3)
CHLORIDE: 93 mmol/L — AB (ref 101–111)
CO2: 32 mmol/L (ref 22–32)
CREATININE: 1.22 mg/dL — AB (ref 0.44–1.00)
GFR calc non Af Amer: 46 mL/min — ABNORMAL LOW (ref >60)
GFR, EST AFRICAN AMERICAN: 54 mL/min — AB (ref >60)
Glucose, Bld: 169 mg/dL — ABNORMAL HIGH (ref 65–99)
Potassium: 3.4 mmol/L — ABNORMAL LOW (ref 3.5–5.1)
SODIUM: 141 mmol/L (ref 135–145)

## 2015-07-27 LAB — GLUCOSE, CAPILLARY
GLUCOSE-CAPILLARY: 186 mg/dL — AB (ref 65–99)
Glucose-Capillary: 200 mg/dL — ABNORMAL HIGH (ref 65–99)
Glucose-Capillary: 231 mg/dL — ABNORMAL HIGH (ref 65–99)

## 2015-07-27 MED ORDER — ETHACRYNIC ACID 25 MG PO TABS: 50.0000 mg | ORAL_TABLET | Freq: Every day | ORAL | Status: DC

## 2015-07-27 MED ORDER — HYDRALAZINE HCL 25 MG PO TABS: 25.0000 mg | ORAL_TABLET | Freq: Three times a day (TID) | ORAL | Status: DC

## 2015-07-27 MED ORDER — INSULIN GLARGINE 100 UNIT/ML ~~LOC~~ SOLN: 50.0000 [IU] | Freq: Every day | SUBCUTANEOUS | Status: DC

## 2015-07-27 MED ORDER — HYDROCORTISONE 0.5 % EX CREA: TOPICAL_CREAM | Freq: Two times a day (BID) | CUTANEOUS | Status: DC

## 2015-07-27 MED ORDER — AMOXICILLIN-POT CLAVULANATE 875-125 MG PO TABS: 1.0000 | ORAL_TABLET | Freq: Two times a day (BID) | ORAL | Status: AC

## 2015-07-27 MED ORDER — LORAZEPAM 0.5 MG PO TABS: 0.5000 mg | ORAL_TABLET | Freq: Three times a day (TID) | ORAL | Status: DC | PRN

## 2015-07-27 NOTE — Progress Notes (Signed)
Patient is being discharged to [facility] room 302. Report given to Jackelyn Poling, Therapist, sports. EMS has been called. Will continue to monitor. Horton Finer

## 2015-07-27 NOTE — Care Management Important Message (Signed)
Important Message  Patient Details  Name: Future Haus MRN: [id number] Date of Birth: 1950-12-06   Medicare Important Message Given:  Yes    Katrina Stack, RN 07/27/2015, 8:34 AM

## 2015-07-27 NOTE — Discharge Summary (Addendum)
Gabrielle [name], 65 y.o., DOB [birth year], MRN [id number]. Admission date: 07/20/2015 Discharge Date 07/27/2015 Primary MD Alvester Chou, NP Admitting Physician Saundra Shelling, MD  Admission Diagnosis  Hyperkalemia [E87.5] Tinea corporis [B35.4] Weakness [R53.1]  Discharge Diagnosis   Principal Problem:   Severe Hyperkalemia   Accelerated hypertension   Bilateral lower extremity cellulitis   Morbid obesity  Generalized weakness   Diabetes type 2   [place] is a 65 y.o. female with a known history of Diabetes mellitus2, osteoarthritis, hypertension presented to the emergency room with generalized weakness. Patient is morbidly obese and uses a lift to get up out of the bed and walks around with the help of a walker. She is mostly bed bound. Patient was evaluated in the emergency room was found to have elevated potassium greater than 7. Patient was admitted for severe hypokalemia thought to be due to spinal lactone she received therapy for hyperkalemia. Her spironolactone was stopped. Her potassium is currently a little low. We will repeat a BMP in a few days. Patient also noted to have bilateral lower extremity cellulitis. Patient also noticed to have accelerated hypertension. This was treated. Patient doing much better. And is stable for discharge to rehabilitation. She'll be on oral antibiotics she is very deconditioned debilitated need further aggressive rehabilitation.            Consults  None  Significant Tests:  See full reports for all details      Dg Chest 1 View  07/21/2015  CLINICAL DATA:  Central line placement. EXAM: CHEST 1 VIEW COMPARISON:  Chest x-ray dated 07/20/2015 FINDINGS: Right IJ central line has been placed with tip adequately positioned at the level of the mid SVC. No pneumothorax. No pleural effusion seen. Cardiomegaly is stable. Overall cardiomediastinal silhouette is stable in size and configuration. The  interstitial prominence, presumed interstitial edema, appears stable. IMPRESSION: 1. Right IJ central line now in place with tip positioned at the level of the mid SVC. No pneumothorax. 2. Persistent interstitial prominence, stable, presumed mild interstitial edema. Electronically Signed   By: Franki Cabot M.D.   On: 07/21/2015 18:33   Dg Chest 1 View  07/20/2015  CLINICAL DATA:  Acute onset of worsening generalized weakness and immobility. Initial encounter. EXAM: CHEST 1 VIEW COMPARISON:  None. FINDINGS: The lungs are well-aerated. Vascular congestion is noted. Mildly increased interstitial markings may reflect minimal interstitial edema. There is no evidence of pleural effusion or pneumothorax. The cardiomediastinal silhouette is mildly enlarged. No acute osseous abnormalities are seen. IMPRESSION: Vascular congestion and mild cardiomegaly. Mildly increased interstitial markings may reflect minimal interstitial edema. Electronically Signed   By: Garald Balding M.D.   On: 07/20/2015 22:26       Today   Subjective:   Gabrielle [name]  feeling better denies any chest pain Blood pressure 131/71, pulse 87, temperature 97.8 F (36.6 C), temperature source Oral, resp. rate 14, height 5\' 6"  (1.676 m), weight 216.819 kg (478 lb), SpO2 92 %.  .  Intake/Output Summary (Last 24 hours) at 07/27/15 1638 Last data filed at 07/27/15 1521  Gross per 24 hour  Intake      0 ml  Output   1250 ml  Net  -1250 ml    Exam VITAL SIGNS: Blood pressure 131/71, pulse 87, temperature 97.8 F (36.6 C), temperature source Oral, resp. rate 14, height 5\' 6"  (1.676 m), weight 216.819 kg (478 lb), SpO2 92 %.  GENERAL:  65 y.o.-year-old patient lying in the bed with no acute distress.  EYES: Pupils equal, round, reactive to light and accommodation. No scleral icterus. Extraocular muscles intact.  HEENT: Head atraumatic, normocephalic. Oropharynx and nasopharynx clear.  NECK:  Supple, no jugular venous distention. No  thyroid enlargement, no tenderness.  LUNGS: Normal breath sounds bilaterally, no wheezing, rales,rhonchi or crepitation. No use of accessory muscles of respiration.  CARDIOVASCULAR: S1, S2 normal. No murmurs, rubs, or gallops.  ABDOMEN: Soft, nontender, nondistended. Bowel sounds present. No organomegaly or mass.  EXTBilateral lower extremity swelling and edema and erythema mostly resolved NEUROLOGIC: Cranial nerves II through XII are intact. Muscle strength 5/5 in all extremities. Sensation intact. Gait not checked.  PSYCHIATRIC: The patient is alert and oriented x 3.  SKIN: No obvious rash, lesion, or ulcer.   Data Review     CBC w Diff:  Lab Results  Component Value Date   WBC 9.1 07/21/2015   HGB 12.4 07/21/2015   HCT 38.7 07/21/2015   PLT 140* 07/21/2015   LYMPHOPCT 13 07/20/2015   MONOPCT 7 07/20/2015   EOSPCT 2 07/20/2015   BASOPCT 1 07/20/2015   CMP:  Lab Results  Component Value Date   NA 141 07/27/2015   K 3.4* 07/27/2015   CL 93* 07/27/2015   CO2 32 07/27/2015   BUN 17 07/27/2015   CREATININE 1.22* 07/27/2015   PROT 8.0 07/20/2015   ALBUMIN 4.2 07/20/2015   BILITOT 1.3* 07/20/2015   ALKPHOS 62 07/20/2015   AST 20 07/20/2015   ALT 18 07/20/2015  .  Micro Results No results found for this or any previous visit (from the past 240 hour(s)).      Code Status Orders        Start     Ordered   07/21/15 0342  Full code   Continuous     07/21/15 0341    Code Status History    Date Active Date Inactive Code Status Order ID Comments User Context   This patient has a current code status but no historical code status.              Follow-up Information    Follow up with md at snf In 7 days.   Why:  Patient will be followed by physician at facility, [facility]      Follow up with [facility] .   Specialty:  [place] information:   [address] [place] [phone]       Discharge Medications     Medication List    STOP taking these medications        captopril 50 MG tablet  Commonly known as:  CAPOTEN     cephALEXin 500 MG capsule  Commonly known as:  KEFLEX     spironolactone 50 MG tablet  Commonly known as:  ALDACTONE      TAKE these medications        acetaminophen 500 MG tablet  Commonly known as:  TYLENOL  Take 500 mg by mouth 3 (three) times daily.     amoxicillin-clavulanate 875-125 MG tablet  Commonly known as:  AUGMENTIN  Take 1 tablet by mouth 2 (two) times daily.     ethacrynic acid 25 MG tablet  Commonly known as:  EDECRIN  Take 2 tablets (50 mg total) by mouth daily.     fluticasone 50 MCG/ACT nasal spray  Commonly known as:  [place]  1 spray into both nostrils at bedtime.     hydrALAZINE 25 MG tablet  Commonly known as:  APRESOLINE  Take 1 tablet (25 mg total) by mouth every 8 (eight) hours.     hydrocortisone cream 0.5 %  Apply topically 2 (two) times daily.     insulin glargine 100 UNIT/ML injection  Commonly known as:  LANTUS  Inject 0.5 mLs (50 Units total) into the skin daily.     insulin lispro 100 UNIT/ML injection  Commonly known as:  HUMALOG  Inject into the skin 3 (three) times daily with meals. Per sliding scale 1 units per 10 grams of carbs     LORazepam 0.5 MG tablet  Commonly known as:  ATIVAN  Take 1 tablet (0.5 mg total) by mouth every 8 (eight) hours as needed for anxiety.     metoprolol succinate 100 MG 24 hr tablet  Commonly known as:  TOPROL-XL  Take 1 tablet by mouth daily.     nabumetone 500 MG tablet  Commonly known as:  RELAFEN  Take 1 tablet by mouth 2 (two) times daily.     neomycin-bacitracin-polymyxin ointment  Commonly known as:  NEOSPORIN  Apply 1 application topically as needed for wound care. apply to eye     nystatin powder  Generic drug:  nystatin  Apply 1 g topically daily as needed.     vitamin B-12 1000 MCG tablet  Commonly known as:  CYANOCOBALAMIN   Take 1,000 mcg by mouth daily.     Vitamin D3 1000 units Caps  Take 2,000 Units by mouth daily.           Total Time in preparing paper work, data evaluation and todays exam - 35 minutes  Dustin Flock M.D on 07/27/2015 at 4:38 PM  [facility]   Office  [phone]

## 2015-07-27 NOTE — Clinical Social Work Placement (Signed)
   CLINICAL SOCIAL WORK PLACEMENT  NOTE  Date:  07/27/2015  Patient Details  Name: Gabrielle [name] MRN: [id number] Date of Birth: 1950-12-23  Clinical Social Work is seeking post-discharge placement for this patient at the [place] level of care (*CSW will initial, date and re-position this form in  chart as items are completed):  Yes   Patient/family provided with [facility] Work Department's list of facilities offering this level of care within the geographic area requested by the patient (or if unable, by the patient's family).  Yes   Patient/family informed of their freedom to choose among providers that offer the needed level of care, that participate in Medicare, Medicaid or managed care program needed by the patient, have an available bed and are willing to accept the patient.  Yes   Patient/family informed of [facility]'s ownership interest in [facility] and [facility], as well as of the fact that they are under no obligation to receive care at these facilities.  PASRR submitted to EDS on 07/27/15     PASRR number received on 07/27/15     Existing PASRR number confirmed on       FL2 transmitted to all facilities in geographic area requested by pt/family on 07/27/15     FL2 transmitted to all facilities within larger geographic area on 07/27/15     Patient informed that his/her managed care company has contracts with or will negotiate with certain facilities, including the following:        Yes   Patient/family informed of bed offers received.  Patient chooses bed at  [facility])     Physician recommends and patient chooses bed at      Patient to be transferred to  [facility] on 07/27/15.  Patient to be transferred to facility by  (EMS)     Patient family notified on 07/27/15 of transfer.  Name of family member notified:   (Son)     PHYSICIAN       Additional Comment:     _______________________________________________ Baldemar Lenis, LCSW 07/27/2015, 2:48 PM

## 2015-07-27 NOTE — Discharge Instructions (Signed)
°  DIET:  Diabetic diet, cardiac diet  DISCHARGE CONDITION:  Stable  ACTIVITY:  Activity as tolerated  OXYGEN:  Home Oxygen: No.   Oxygen Delivery: room air  DISCHARGE LOCATION:  nursing home    ADDITIONAL DISCHARGE INSTRUCTION: accuchecks qac and qhs as per standard ssi protocol Check bmp in 4 days for hypokalemia/hyperkalemia   If you experience worsening of your admission symptoms, develop shortness of breath, life threatening emergency, suicidal or homicidal thoughts you must seek medical attention immediately by calling 911 or calling your MD immediately  if symptoms less severe.  You Must read complete instructions/literature along with all the possible adverse reactions/side effects for all the Medicines you take and that have been prescribed to you. Take any new Medicines after you have completely understood and accpet all the possible adverse reactions/side effects.   Please note  You were cared for by a hospitalist during your hospital stay. If you have any questions about your discharge medications or the care you received while you were in the hospital after you are discharged, you can call the unit and asked to speak with the hospitalist on call if the hospitalist that took care of you is not available. Once you are discharged, your primary care physician will handle any further medical issues. Please note that NO REFILLS for any discharge medications will be authorized once you are discharged, as it is imperative that you return to your primary care physician (or establish a relationship with a primary care physician if you do not have one) for your aftercare needs so that they can reassess your need for medications and monitor your lab values.

## 2015-07-27 NOTE — Progress Notes (Addendum)
CSW presented bed offers. Informed patient that she will have to choose a bed as late as 3PM today. She reports she'll like to review the offers with her son.   Accepted bed at [facility].  Clinical Social Worker informed that patient will be medically ready to discharge to [facility]. Patient and her son arein a agreement with plan. CSW called Neoma Laming- Admissions Coordinator at [facility] to confirm that patient's bed is ready. Provided patient's room number 302 and number to call for report C-Wing nurse [phone] . All discharge information faxed to [facility]. RN will call report and patient will discharge to [facility] via EMS   Ernest Pine, MSW, [facility] Work Department [phone]

## 2015-07-27 NOTE — Progress Notes (Signed)
Patient received discharge instructions, pt verbalized understanding. IV was removed with no signs of infection. Dressing clean, dry intact. No skin tears or wounds present. Prescription was printed and given to patient. Patient was escorted via EMS. Discharge packet prepared by Margreta Journey, LCSW. No further needs from care management team.

## 2015-08-14 ENCOUNTER — Other Ambulatory Visit
Admission: RE | Admit: 2015-08-14 | Discharge: 2015-08-14 | Disposition: A | Discharge status: Home / Self Care | Payer: Medicare Other | Source: Ambulatory Visit | Attending: Family Medicine | Admitting: Family Medicine

## 2015-08-14 DIAGNOSIS — I10 Essential (primary) hypertension: Secondary | ICD-10-CM | POA: Insufficient documentation

## 2015-08-14 DIAGNOSIS — E875 Hyperkalemia: Secondary | ICD-10-CM | POA: Diagnosis present

## 2015-08-14 DIAGNOSIS — E119 Type 2 diabetes mellitus without complications: Secondary | ICD-10-CM | POA: Insufficient documentation

## 2015-08-14 DIAGNOSIS — L0390 Cellulitis, unspecified: Secondary | ICD-10-CM | POA: Insufficient documentation

## 2015-08-14 LAB — BRAIN NATRIURETIC PEPTIDE: B NATRIURETIC PEPTIDE 5: 106 pg/mL — AB (ref 0.0–100.0)

## 2015-10-01 ENCOUNTER — Inpatient Hospital Stay
Admission: EM | Admit: 2015-10-01 | Discharge: 2015-10-04 | DRG: 682 | Disposition: A | Discharge status: Home Health | Payer: Medicare Other | Attending: Specialist | Admitting: Specialist

## 2015-10-01 ENCOUNTER — Encounter: Payer: Self-pay | Admitting: Emergency Medicine

## 2015-10-01 ENCOUNTER — Emergency Department: Payer: Medicare Other

## 2015-10-01 DIAGNOSIS — N182 Chronic kidney disease, stage 2 (mild): Secondary | ICD-10-CM | POA: Diagnosis present

## 2015-10-01 DIAGNOSIS — Z794 Long term (current) use of insulin: Secondary | ICD-10-CM

## 2015-10-01 DIAGNOSIS — T502X5A Adverse effect of carbonic-anhydrase inhibitors, benzothiadiazides and other diuretics, initial encounter: Secondary | ICD-10-CM | POA: Diagnosis present

## 2015-10-01 DIAGNOSIS — N189 Chronic kidney disease, unspecified: Secondary | ICD-10-CM

## 2015-10-01 DIAGNOSIS — M542 Cervicalgia: Secondary | ICD-10-CM

## 2015-10-01 DIAGNOSIS — Z79899 Other long term (current) drug therapy: Secondary | ICD-10-CM

## 2015-10-01 DIAGNOSIS — N179 Acute kidney failure, unspecified: Secondary | ICD-10-CM | POA: Diagnosis present

## 2015-10-01 DIAGNOSIS — I5032 Chronic diastolic (congestive) heart failure: Secondary | ICD-10-CM | POA: Diagnosis present

## 2015-10-01 DIAGNOSIS — Z9981 Dependence on supplemental oxygen: Secondary | ICD-10-CM

## 2015-10-01 DIAGNOSIS — I130 Hypertensive heart and chronic kidney disease with heart failure and stage 1 through stage 4 chronic kidney disease, or unspecified chronic kidney disease: Secondary | ICD-10-CM | POA: Diagnosis present

## 2015-10-01 DIAGNOSIS — R0602 Shortness of breath: Secondary | ICD-10-CM | POA: Diagnosis present

## 2015-10-01 DIAGNOSIS — Z6841 Body Mass Index (BMI) 40.0 and over, adult: Secondary | ICD-10-CM

## 2015-10-01 DIAGNOSIS — E662 Morbid (severe) obesity with alveolar hypoventilation: Secondary | ICD-10-CM | POA: Diagnosis present

## 2015-10-01 DIAGNOSIS — E119 Type 2 diabetes mellitus without complications: Secondary | ICD-10-CM

## 2015-10-01 DIAGNOSIS — E1122 Type 2 diabetes mellitus with diabetic chronic kidney disease: Secondary | ICD-10-CM | POA: Diagnosis present

## 2015-10-01 DIAGNOSIS — I10 Essential (primary) hypertension: Secondary | ICD-10-CM | POA: Diagnosis present

## 2015-10-01 DIAGNOSIS — E873 Alkalosis: Secondary | ICD-10-CM | POA: Diagnosis present

## 2015-10-01 DIAGNOSIS — Z833 Family history of diabetes mellitus: Secondary | ICD-10-CM

## 2015-10-01 DIAGNOSIS — J9621 Acute and chronic respiratory failure with hypoxia: Secondary | ICD-10-CM | POA: Diagnosis present

## 2015-10-01 DIAGNOSIS — I878 Other specified disorders of veins: Secondary | ICD-10-CM | POA: Diagnosis present

## 2015-10-01 DIAGNOSIS — J9622 Acute and chronic respiratory failure with hypercapnia: Secondary | ICD-10-CM | POA: Diagnosis present

## 2015-10-01 LAB — COMPREHENSIVE METABOLIC PANEL
ALT: 16 U/L (ref 14–54)
ANION GAP: 10 (ref 5–15)
AST: 21 U/L (ref 15–41)
Albumin: 4.2 g/dL (ref 3.5–5.0)
Alkaline Phosphatase: 50 U/L (ref 38–126)
BILIRUBIN TOTAL: 1.1 mg/dL (ref 0.3–1.2)
BUN: 39 mg/dL — ABNORMAL HIGH (ref 6–20)
CHLORIDE: 93 mmol/L — AB (ref 101–111)
CO2: 36 mmol/L — ABNORMAL HIGH (ref 22–32)
Calcium: 9.5 mg/dL (ref 8.9–10.3)
Creatinine, Ser: 1.5 mg/dL — ABNORMAL HIGH (ref 0.44–1.00)
GFR, EST AFRICAN AMERICAN: 42 mL/min — AB (ref >60)
GFR, EST NON AFRICAN AMERICAN: 36 mL/min — AB (ref >60)
Glucose, Bld: 181 mg/dL — ABNORMAL HIGH (ref 65–99)
POTASSIUM: 4.1 mmol/L (ref 3.5–5.1)
Sodium: 139 mmol/L (ref 135–145)
TOTAL PROTEIN: 8.2 g/dL — AB (ref 6.5–8.1)

## 2015-10-01 LAB — CBC WITH DIFFERENTIAL/PLATELET
BASOS ABS: 0 10*3/uL (ref 0–0.1)
Basophils Relative: 1 %
EOS PCT: 3 %
Eosinophils Absolute: 0.3 10*3/uL (ref 0–0.7)
HEMATOCRIT: 38.7 % (ref 35.0–47.0)
Hemoglobin: 13.1 g/dL (ref 12.0–16.0)
LYMPHS ABS: 1.5 10*3/uL (ref 1.0–3.6)
LYMPHS PCT: 17 %
MCH: 32.2 pg (ref 26.0–34.0)
MCHC: 33.9 g/dL (ref 32.0–36.0)
MCV: 95.2 fL (ref 80.0–100.0)
MONO ABS: 0.6 10*3/uL (ref 0.2–0.9)
Monocytes Relative: 7 %
NEUTROS ABS: 6.2 10*3/uL (ref 1.4–6.5)
Neutrophils Relative %: 72 %
PLATELETS: 178 10*3/uL (ref 150–440)
RBC: 4.07 MIL/uL (ref 3.80–5.20)
RDW: 13.8 % (ref 11.5–14.5)
WBC: 8.6 10*3/uL (ref 3.6–11.0)

## 2015-10-01 LAB — BLOOD GAS, ARTERIAL
ACID-BASE EXCESS: 14.2 mmol/L — AB (ref 0.0–3.0)
Bicarbonate: 39.6 mEq/L — ABNORMAL HIGH (ref 21.0–28.0)
FIO2: 0.28
O2 SAT: 99.5 %
PATIENT TEMPERATURE: 37
pCO2 arterial: 52 mmHg — ABNORMAL HIGH (ref 32.0–48.0)
pH, Arterial: 7.49 — ABNORMAL HIGH (ref 7.350–7.450)
pO2, Arterial: 158 mmHg — ABNORMAL HIGH (ref 83.0–108.0)

## 2015-10-01 LAB — TROPONIN I

## 2015-10-01 NOTE — ED Notes (Signed)
Pt taken off O2 by Darl Householder, MD, pts O2 stat dropped to 85%. Pt placed back on 2L.

## 2015-10-01 NOTE — ED Provider Notes (Signed)
[facility] Provider Note   CSN: [id number] Arrival date & time: 10/01/15  2017     History   Chief Complaint Chief Complaint  Patient presents with  . Abnormal Lab    HPI Gabrielle [name] is a 65 y.o. female hx of DM, HTN, arthritis, Here presenting with shortness of breath, anxiety. She was recently admitted to the hospital for hyperkalemia. Patient received medications and was eventually dialyzed and hyperkalemia resolved. She was taken off of spironolactone and placed on edecrin. She went to rehab and now is home for the last week. She states that she gets very anxious recently. Has some subjective shortness of breath. She denies chest pain or fever or cough. Denies suicidal or homicidal ideation. States that she just finished a course of augmentin for her leg cellulitis. Home health went to see her today and had labs showed CO2 36, BUN 34, Cr 1.2. Sent for elevated CO2 and elevated BUN. States that she is urinating a lot recently.   The history is provided by the patient.    Past Medical History:  Diagnosis Date  . Arthritis   . Diabetes mellitus without complication ([place])   . Hypertension   . Osteoarthritis     Patient Active Problem List   Diagnosis Date Noted  . Hyperkalemia 07/21/2015  . Uncontrolled hypertension 07/21/2015    Past Surgical History:  Procedure Laterality Date  . none      OB History    No data available       Home Medications    Prior to Admission medications   Medication Sig Start Date End Date Taking? Authorizing Provider  acetaminophen (TYLENOL) 500 MG tablet Take 500 mg by mouth 3 (three) times daily.    Historical Provider, MD  Cholecalciferol (VITAMIN D3) 1000 units CAPS Take 2,000 Units by mouth daily.    Historical Provider, MD  ethacrynic acid (EDECRIN) 25 MG tablet Take 2 tablets (50 mg total) by mouth daily. 07/27/15   Dustin Flock, MD  fluticasone (FLONASE) 50 MCG/ACT nasal spray Place 1 spray into both nostrils  at bedtime.    Historical Provider, MD  hydrALAZINE (APRESOLINE) 25 MG tablet Take 1 tablet (25 mg total) by mouth every 8 (eight) hours. 07/27/15   Dustin Flock, MD  hydrocortisone cream 0.5 % Apply topically 2 (two) times daily. 07/27/15   Dustin Flock, MD  insulin glargine (LANTUS) 100 UNIT/ML injection Inject 0.5 mLs (50 Units total) into the skin daily. 07/27/15   Dustin Flock, MD  insulin lispro (HUMALOG) 100 UNIT/ML injection Inject into the skin 3 (three) times daily with meals. Per sliding scale 1 units per 10 grams of carbs    Historical Provider, MD  LORazepam (ATIVAN) 0.5 MG tablet Take 1 tablet (0.5 mg total) by mouth every 8 (eight) hours as needed for anxiety. 07/27/15   Dustin Flock, MD  metoprolol succinate (TOPROL-XL) 100 MG 24 hr tablet Take 1 tablet by mouth daily. 05/18/15   Historical Provider, MD  nabumetone (RELAFEN) 500 MG tablet Take 1 tablet by mouth 2 (two) times daily. 06/16/15   Historical Provider, MD  neomycin-bacitracin-polymyxin (NEOSPORIN) ointment Apply 1 application topically as needed for wound care. apply to eye    Historical Provider, MD  nystatin (NYSTATIN) powder Apply 1 g topically daily as needed.    Historical Provider, MD  vitamin B-12 (CYANOCOBALAMIN) 1000 MCG tablet Take 1,000 mcg by mouth daily.    Historical Provider, MD    Family History Family History  Problem Relation  Age of Onset  . Diabetes Mellitus II Father     Social History Social History  Substance Use Topics  . Smoking status: Never Smoker  . Smokeless tobacco: Never Used  . Alcohol use No     Allergies   Lasix [furosemide]; Ultram [tramadol]; Erythromycin; and Tetracyclines & related   Review of Systems Review of Systems  Psychiatric/Behavioral: The patient is nervous/anxious.   All other systems reviewed and are negative.    Physical Exam Updated Vital Signs BP (!) 120/56   Pulse 79   Temp 97.6 F (36.4 C) (Oral)   Resp (!) 23   Ht 5\' 6"  (1.676 m)   Wt (!)  458 lb (207.7 kg)   SpO2 97%   BMI 73.92 kg/m   Physical Exam  Constitutional: She is oriented to person, place, and time.  Obese, chronically ill   HENT:  Head: Normocephalic.  Eyes: EOM are normal. Pupils are equal, round, and reactive to light.  Neck: Normal range of motion. Neck supple.  Cardiovascular: Normal rate, regular rhythm and normal heart sounds.   Pulmonary/Chest:  Diminished bilateral bases   Abdominal: Soft. Bowel sounds are normal.  Musculoskeletal:  2+ edema with some redness, no obvious cellulitis (chronic)   Neurological: She is alert and oriented to person, place, and time.  Skin: Skin is warm. There is erythema.  Psychiatric: She has a normal mood and affect.  Nursing note and vitals reviewed.    ED Treatments / Results  Labs (all labs ordered are listed, but only abnormal results are displayed) Labs Reviewed  COMPREHENSIVE METABOLIC PANEL - Abnormal; Notable for the following:       Result Value   Chloride 93 (*)    CO2 36 (*)    Glucose, Bld 181 (*)    BUN 39 (*)    Creatinine, Ser 1.50 (*)    Total Protein 8.2 (*)    GFR calc non Af Amer 36 (*)    GFR calc Af Amer 42 (*)    All other components within normal limits  BLOOD GAS, ARTERIAL - Abnormal; Notable for the following:    pH, Arterial 7.49 (*)    pCO2 arterial 52 (*)    pO2, Arterial 158 (*)    Bicarbonate 39.6 (*)    Acid-Base Excess 14.2 (*)    All other components within normal limits  CBC WITH DIFFERENTIAL/PLATELET  TROPONIN I  BRAIN NATRIURETIC PEPTIDE    EKG  EKG Interpretation None      ED ECG REPORT I, Wandra Arthurs, the attending physician, personally viewed and interpreted this ECG.   Date: 10/01/2015  EKG Time:  20:21   Rate: 91  Rhythm: normal EKG, normal sinus rhythm  Axis: normal  Intervals:none  ST&T Change: borderline prolonged QT    Radiology Dg Chest Port 1 View  Result Date: 10/01/2015 CLINICAL DATA:  Shortness of breath EXAM: PORTABLE CHEST 1 VIEW  COMPARISON:  07/21/2015 FINDINGS: Mild enlargement of the cardiopericardial silhouette with indistinct pulmonary vasculature suggesting pulmonary venous hypertension, but no overt edema. Body habitus reduces diagnostic sensitivity and specificity. The right IJ line has been removed. IMPRESSION: 1. Mild enlargement of the cardiopericardial silhouette with pulmonary venous hypertension, but no overt edema. Electronically Signed   By: Van Clines M.D.   On: 10/01/2015 20:52    Procedures Procedures (including critical care time)  Medications Ordered in ED Medications - No data to display   Initial Impression / Assessment and Plan / ED Course  I have reviewed the triage vital signs and the nursing notes.  Pertinent labs & imaging results that were available during my care of the patient were reviewed by me and considered in my medical decision making (see chart for details).  Clinical Course    Gabrielle [name] is a 65 y.o. female here with anxiety, elevated CO2. Will repeat labs, ABG, CXR. She is breathing comfortably.   9:50 PM Labs showed pH 7.5, CO2 52, bicarb 39. Cr 1.5. Still hypoxic to 89% on RA. She doesn't have oxygen at home. I think likely over diuresis causing metabolic alkalosis. Will need to set up oxygen and will need controlled diuresis in the hospital.     Final Clinical Impressions(s) / ED Diagnoses   Final diagnoses:  None    New Prescriptions New Prescriptions   No medications on file     Drenda Freeze, MD 10/01/15 2151

## 2015-10-01 NOTE — ED Triage Notes (Signed)
Pt arrived from home by EMS with reports that pts at home health care nurse wanted pt to come to ED due abnormal labs (BUN and CO2). Pt was recently d/c from [facility] for rehab. Upon arrival to ED pt on 4L stat at 95%, 90% RA. Pt is A&O x4.

## 2015-10-01 NOTE — H&P (Signed)
[facility] NAME: Gabrielle [name]    MR#:  [id number]  DATE OF BIRTH:  01-06-1951  DATE OF ADMISSION:  10/01/2015  PRIMARY CARE PHYSICIAN: Alvester Chou, NP   REQUESTING/REFERRING PHYSICIAN: Darl Householder, MD  CHIEF COMPLAINT:   Chief Complaint  Patient presents with  . Abnormal Lab    HISTORY OF PRESENT ILLNESS:  Gabrielle [name]  is a 65 y.o. female who presents with progressive shortness of breath. Patient also had some abnormal lab values in the outpatient setting. Here she was evaluated and found to have contraction alkalosis with respiratory compensation. However, she was also found to have a little bit of AK I, felt to be from recent diuretic use. Hospitalists were called for admission  PAST MEDICAL HISTORY:   Past Medical History:  Diagnosis Date  . Arthritis   . Diabetes mellitus without complication ([place])   . Hypertension   . Osteoarthritis     PAST SURGICAL HISTORY:   Past Surgical History:  Procedure Laterality Date  . NO PAST SURGERIES    . none      SOCIAL HISTORY:   Social History  Substance Use Topics  . Smoking status: Never Smoker  . Smokeless tobacco: Never Used  . Alcohol use No    FAMILY HISTORY:   Family History  Problem Relation Age of Onset  . Diabetes Mellitus II Father     DRUG ALLERGIES:   Allergies  Allergen Reactions  . Lasix [Furosemide]     Numbness and tingling of the face   . Ultram [Tramadol] Other (See Comments)    Pt reports, "makes my head feel funny."  . Erythromycin Rash  . Tetracyclines & Related Rash    MEDICATIONS AT HOME:   Prior to Admission medications   Medication Sig Start Date End Date Taking? Authorizing Provider  acetaminophen (TYLENOL) 500 MG tablet Take 500 mg by mouth 3 (three) times daily.    Historical Provider, MD  Cholecalciferol (VITAMIN D3) 1000 units CAPS Take 2,000 Units by mouth daily.    Historical Provider, MD  ethacrynic acid (EDECRIN)  25 MG tablet Take 2 tablets (50 mg total) by mouth daily. 07/27/15   Dustin Flock, MD  fluticasone (FLONASE) 50 MCG/ACT nasal spray Place 1 spray into both nostrils at bedtime.    Historical Provider, MD  hydrALAZINE (APRESOLINE) 25 MG tablet Take 1 tablet (25 mg total) by mouth every 8 (eight) hours. 07/27/15   Dustin Flock, MD  hydrocortisone cream 0.5 % Apply topically 2 (two) times daily. 07/27/15   Dustin Flock, MD  insulin glargine (LANTUS) 100 UNIT/ML injection Inject 0.5 mLs (50 Units total) into the skin daily. 07/27/15   Dustin Flock, MD  insulin lispro (HUMALOG) 100 UNIT/ML injection Inject into the skin 3 (three) times daily with meals. Per sliding scale 1 units per 10 grams of carbs    Historical Provider, MD  LORazepam (ATIVAN) 0.5 MG tablet Take 1 tablet (0.5 mg total) by mouth every 8 (eight) hours as needed for anxiety. 07/27/15   Dustin Flock, MD  metoprolol succinate (TOPROL-XL) 100 MG 24 hr tablet Take 1 tablet by mouth daily. 05/18/15   Historical Provider, MD  nabumetone (RELAFEN) 500 MG tablet Take 1 tablet by mouth 2 (two) times daily. 06/16/15   Historical Provider, MD  neomycin-bacitracin-polymyxin (NEOSPORIN) ointment Apply 1 application topically as needed for wound care. apply to eye    Historical Provider, MD  nystatin (NYSTATIN) powder Apply 1 g  topically daily as needed.    Historical Provider, MD  vitamin B-12 (CYANOCOBALAMIN) 1000 MCG tablet Take 1,000 mcg by mouth daily.    Historical Provider, MD    REVIEW OF SYSTEMS:  Review of Systems  Constitutional: Negative for chills, fever, malaise/fatigue and weight loss.  HENT: Negative for ear pain, hearing loss and tinnitus.   Eyes: Negative for blurred vision, double vision, pain and redness.  Respiratory: Positive for shortness of breath. Negative for cough and hemoptysis.   Cardiovascular: Positive for leg swelling. Negative for chest pain, palpitations and orthopnea.  Gastrointestinal: Negative for abdominal  pain, constipation, diarrhea, nausea and vomiting.  Genitourinary: Negative for dysuria, frequency and hematuria.  Musculoskeletal: Negative for back pain, joint pain and neck pain.  Skin:       No acne, rash, or lesions  Neurological: Negative for dizziness, tremors, focal weakness and weakness.  Endo/Heme/Allergies: Negative for polydipsia. Does not bruise/bleed easily.  Psychiatric/Behavioral: Negative for depression. The patient is not nervous/anxious and does not have insomnia.      VITAL SIGNS:   Vitals:   10/01/15 2026 10/01/15 2030 10/01/15 2100 10/01/15 2130  BP:  134/67 (!) 142/61 (!) 120/56  Pulse:  80 80 79  Resp:  20 18 (!) 23  Temp:      TempSrc:      SpO2: 99% 97% 97% 97%  Weight:      Height:       Wt Readings from Last 3 Encounters:  10/01/15 (!) 207.7 kg (458 lb)  07/21/15 (!) 216.8 kg (478 lb)    PHYSICAL EXAMINATION:  Physical Exam  Vitals reviewed. Constitutional: She is oriented to person, place, and time. She appears well-developed and well-nourished. No distress.  HENT:  Head: Normocephalic and atraumatic.  Mouth/Throat: Oropharynx is clear and moist.  Eyes: Conjunctivae and EOM are normal. Pupils are equal, round, and reactive to light. No scleral icterus.  Neck: Normal range of motion. Neck supple. No JVD present. No thyromegaly present.  Cardiovascular: Normal rate, regular rhythm and intact distal pulses.  Exam reveals no gallop and no friction rub.   No murmur heard. Respiratory: Effort normal and breath sounds normal. No respiratory distress. She has no wheezes. She has no rales.  GI: Soft. Bowel sounds are normal. She exhibits no distension. There is no tenderness.  Musculoskeletal: Normal range of motion. She exhibits no edema.  No arthritis, no gout  Lymphadenopathy:    She has no cervical adenopathy.  Neurological: She is alert and oriented to person, place, and time. No cranial nerve deficit.  No dysarthria, no aphasia  Skin: Skin is  warm and dry. No rash noted. No erythema.  Psychiatric: She has a normal mood and affect. Her behavior is normal. Judgment and thought content normal.    LABORATORY PANEL:   CBC  Recent Labs Lab 10/01/15 2026  WBC 8.6  HGB 13.1  HCT 38.7  PLT 178   ------------------------------------------------------------------------------------------------------------------  Chemistries   Recent Labs Lab 10/01/15 2026  NA 139  K 4.1  CL 93*  CO2 36*  GLUCOSE 181*  BUN 39*  CREATININE 1.50*  CALCIUM 9.5  AST 21  ALT 16  ALKPHOS 50  BILITOT 1.1   ------------------------------------------------------------------------------------------------------------------  Cardiac Enzymes  Recent Labs Lab 10/01/15 2026  TROPONINI <0.03   ------------------------------------------------------------------------------------------------------------------  RADIOLOGY:  Dg Chest Port 1 View  Result Date: 10/01/2015 CLINICAL DATA:  Shortness of breath EXAM: PORTABLE CHEST 1 VIEW COMPARISON:  07/21/2015 FINDINGS: Mild enlargement of the cardiopericardial silhouette  with indistinct pulmonary vasculature suggesting pulmonary venous hypertension, but no overt edema. Body habitus reduces diagnostic sensitivity and specificity. The right IJ line has been removed. IMPRESSION: 1. Mild enlargement of the cardiopericardial silhouette with pulmonary venous hypertension, but no overt edema. Electronically Signed   By: Van Clines M.D.   On: 10/01/2015 20:52    EKG:   Orders placed or performed during the hospital encounter of 10/01/15  . EKG 12-Lead  . EKG 12-Lead    IMPRESSION AND PLAN:  Principal Problem:   AKI (acute kidney injury) ([place]) - patient has mild AK I, likely due to prerenal insult from diuretic use. We will hydrate her very gently tonight and monitor her labs in the morning. Hold diuretics. Active Problems:   SOB (shortness of breath) - likely due to her respiratory compensation  from her contraction alkalosis due to her diuretic use. Also likely component of OHS.  O2 sats are okay on minimal oxygen supplement, continue to monitor.   Uncontrolled hypertension - continue home meds   Diabetes ([employer]) - sliding scale insulin with corresponding glucose checks  All the records are reviewed and case discussed with ED provider. Management plans discussed with the patient and/or family.  DVT PROPHYLAXIS: SubQ heparin  GI PROPHYLAXIS: None  ADMISSION STATUS: Observation  CODE STATUS: Full Code Status History    Date Active Date Inactive Code Status Order ID Comments User Context   07/21/2015  3:41 AM 07/27/2015  8:21 PM Full Code [id number]  Saundra Shelling, MD Inpatient      TOTAL TIME TAKING CARE OF THIS PATIENT: 40 minutes.    Devontae Casasola FIELDING 10/01/2015, 10:11 PM  [facility]  Office  [phone]  CC: Primary care physician; Alvester Chou, NP

## 2015-10-02 ENCOUNTER — Observation Stay: Payer: Medicare Other

## 2015-10-02 DIAGNOSIS — Z794 Long term (current) use of insulin: Secondary | ICD-10-CM | POA: Diagnosis not present

## 2015-10-02 DIAGNOSIS — E1122 Type 2 diabetes mellitus with diabetic chronic kidney disease: Secondary | ICD-10-CM | POA: Diagnosis present

## 2015-10-02 DIAGNOSIS — I878 Other specified disorders of veins: Secondary | ICD-10-CM | POA: Diagnosis present

## 2015-10-02 DIAGNOSIS — N182 Chronic kidney disease, stage 2 (mild): Secondary | ICD-10-CM | POA: Diagnosis present

## 2015-10-02 DIAGNOSIS — E873 Alkalosis: Secondary | ICD-10-CM | POA: Diagnosis present

## 2015-10-02 DIAGNOSIS — E662 Morbid (severe) obesity with alveolar hypoventilation: Secondary | ICD-10-CM | POA: Diagnosis present

## 2015-10-02 DIAGNOSIS — J9621 Acute and chronic respiratory failure with hypoxia: Secondary | ICD-10-CM | POA: Diagnosis present

## 2015-10-02 DIAGNOSIS — I5032 Chronic diastolic (congestive) heart failure: Secondary | ICD-10-CM | POA: Diagnosis present

## 2015-10-02 DIAGNOSIS — T502X5A Adverse effect of carbonic-anhydrase inhibitors, benzothiadiazides and other diuretics, initial encounter: Secondary | ICD-10-CM | POA: Diagnosis present

## 2015-10-02 DIAGNOSIS — J9622 Acute and chronic respiratory failure with hypercapnia: Secondary | ICD-10-CM | POA: Diagnosis present

## 2015-10-02 DIAGNOSIS — N179 Acute kidney failure, unspecified: Secondary | ICD-10-CM | POA: Diagnosis present

## 2015-10-02 DIAGNOSIS — Z79899 Other long term (current) drug therapy: Secondary | ICD-10-CM | POA: Diagnosis not present

## 2015-10-02 DIAGNOSIS — Z6841 Body Mass Index (BMI) 40.0 and over, adult: Secondary | ICD-10-CM | POA: Diagnosis not present

## 2015-10-02 DIAGNOSIS — I130 Hypertensive heart and chronic kidney disease with heart failure and stage 1 through stage 4 chronic kidney disease, or unspecified chronic kidney disease: Secondary | ICD-10-CM | POA: Diagnosis present

## 2015-10-02 DIAGNOSIS — Z833 Family history of diabetes mellitus: Secondary | ICD-10-CM | POA: Diagnosis not present

## 2015-10-02 DIAGNOSIS — Z9981 Dependence on supplemental oxygen: Secondary | ICD-10-CM | POA: Diagnosis not present

## 2015-10-02 DIAGNOSIS — N189 Chronic kidney disease, unspecified: Secondary | ICD-10-CM | POA: Diagnosis present

## 2015-10-02 LAB — CBC
HCT: 34.7 % — ABNORMAL LOW (ref 35.0–47.0)
HEMOGLOBIN: 11.7 g/dL — AB (ref 12.0–16.0)
MCH: 32.5 pg (ref 26.0–34.0)
MCHC: 33.8 g/dL (ref 32.0–36.0)
MCV: 96.4 fL (ref 80.0–100.0)
PLATELETS: 162 10*3/uL (ref 150–440)
RBC: 3.6 MIL/uL — AB (ref 3.80–5.20)
RDW: 14 % (ref 11.5–14.5)
WBC: 7.8 10*3/uL (ref 3.6–11.0)

## 2015-10-02 LAB — GLUCOSE, CAPILLARY
GLUCOSE-CAPILLARY: 149 mg/dL — AB (ref 65–99)
GLUCOSE-CAPILLARY: 159 mg/dL — AB (ref 65–99)
Glucose-Capillary: 121 mg/dL — ABNORMAL HIGH (ref 65–99)
Glucose-Capillary: 197 mg/dL — ABNORMAL HIGH (ref 65–99)
Glucose-Capillary: 203 mg/dL — ABNORMAL HIGH (ref 65–99)

## 2015-10-02 LAB — BASIC METABOLIC PANEL
ANION GAP: 8 (ref 5–15)
BUN: 38 mg/dL — ABNORMAL HIGH (ref 6–20)
CALCIUM: 8.8 mg/dL — AB (ref 8.9–10.3)
CO2: 38 mmol/L — ABNORMAL HIGH (ref 22–32)
CREATININE: 1.31 mg/dL — AB (ref 0.44–1.00)
Chloride: 97 mmol/L — ABNORMAL LOW (ref 101–111)
GFR, EST AFRICAN AMERICAN: 49 mL/min — AB (ref >60)
GFR, EST NON AFRICAN AMERICAN: 43 mL/min — AB (ref >60)
Glucose, Bld: 137 mg/dL — ABNORMAL HIGH (ref 65–99)
Potassium: 4.2 mmol/L (ref 3.5–5.1)
Sodium: 143 mmol/L (ref 135–145)

## 2015-10-02 LAB — BRAIN NATRIURETIC PEPTIDE: B Natriuretic Peptide: 97 pg/mL (ref 0.0–100.0)

## 2015-10-02 LAB — HEMOGLOBIN A1C: HEMOGLOBIN A1C: 8.7 % — AB (ref 4.0–6.0)

## 2015-10-02 MED ORDER — LORAZEPAM 0.5 MG PO TABS
0.5000 mg | ORAL_TABLET | Freq: Three times a day (TID) | ORAL | Status: DC | PRN
  Administered 2015-10-02 – 2015-10-04 (×4): 0.5 mg via ORAL
  Filled 2015-10-02 (×4): qty 1

## 2015-10-02 MED ORDER — HYDRALAZINE HCL 25 MG PO TABS
25.0000 mg | ORAL_TABLET | Freq: Three times a day (TID) | ORAL | Status: DC
  Administered 2015-10-02 – 2015-10-04 (×6): 25 mg via ORAL
  Filled 2015-10-02 (×7): qty 1

## 2015-10-02 MED ORDER — METOPROLOL SUCCINATE ER 50 MG PO TB24
100.0000 mg | ORAL_TABLET | Freq: Every day | ORAL | Status: DC
  Filled 2015-10-02 (×3): qty 2

## 2015-10-02 MED ORDER — SODIUM CHLORIDE 0.9 % IV SOLN
INTRAVENOUS | Status: DC
  Administered 2015-10-02: 01:00:00 via INTRAVENOUS

## 2015-10-02 MED ORDER — HEPARIN SODIUM (PORCINE) 5000 UNIT/ML IJ SOLN
5000.0000 [IU] | Freq: Three times a day (TID) | INTRAMUSCULAR | Status: DC
  Administered 2015-10-02 (×2): 5000 [IU] via SUBCUTANEOUS
  Filled 2015-10-02 (×2): qty 1

## 2015-10-02 MED ORDER — DEXTROSE 5 % IV SOLN
50.0000 mg | Freq: Every day | INTRAVENOUS | Status: DC
  Administered 2015-10-02 – 2015-10-04 (×3): 50 mg via INTRAVENOUS
  Filled 2015-10-02 (×4): qty 50

## 2015-10-02 MED ORDER — ENOXAPARIN SODIUM 40 MG/0.4ML ~~LOC~~ SOLN
40.0000 mg | Freq: Two times a day (BID) | SUBCUTANEOUS | Status: DC
  Administered 2015-10-02 – 2015-10-04 (×4): 40 mg via SUBCUTANEOUS
  Filled 2015-10-02 (×4): qty 0.4

## 2015-10-02 MED ORDER — POLYETHYLENE GLYCOL 3350 17 G PO PACK
17.0000 g | PACK | Freq: Every day | ORAL | Status: DC
  Administered 2015-10-02 – 2015-10-04 (×3): 17 g via ORAL
  Filled 2015-10-02 (×3): qty 1

## 2015-10-02 MED ORDER — ACETAMINOPHEN 650 MG RE SUPP: 650.0000 mg | Freq: Four times a day (QID) | RECTAL | Status: DC | PRN

## 2015-10-02 MED ORDER — INSULIN ASPART 100 UNIT/ML ~~LOC~~ SOLN
0.0000 [IU] | Freq: Three times a day (TID) | SUBCUTANEOUS | Status: DC
Start: 2015-10-02 — End: 2015-10-04
  Administered 2015-10-02: 1 [IU] via SUBCUTANEOUS
  Administered 2015-10-02 – 2015-10-03 (×3): 2 [IU] via SUBCUTANEOUS
  Administered 2015-10-03: 3 [IU] via SUBCUTANEOUS
  Administered 2015-10-03: 2 [IU] via SUBCUTANEOUS
  Administered 2015-10-04: 5 [IU] via SUBCUTANEOUS
  Administered 2015-10-04: 2 [IU] via SUBCUTANEOUS
  Filled 2015-10-02 (×2): qty 2
  Filled 2015-10-02: qty 5
  Filled 2015-10-02 (×3): qty 2
  Filled 2015-10-02: qty 1
  Filled 2015-10-02: qty 3

## 2015-10-02 MED ORDER — ACETAMINOPHEN 325 MG PO TABS
650.0000 mg | ORAL_TABLET | Freq: Four times a day (QID) | ORAL | Status: DC | PRN
  Administered 2015-10-02 – 2015-10-04 (×6): 650 mg via ORAL
  Filled 2015-10-02 (×6): qty 2

## 2015-10-02 MED ORDER — ONDANSETRON HCL 4 MG PO TABS: 4.0000 mg | ORAL_TABLET | Freq: Four times a day (QID) | ORAL | Status: DC | PRN

## 2015-10-02 MED ORDER — ONDANSETRON HCL 4 MG/2ML IJ SOLN
4.0000 mg | Freq: Four times a day (QID) | INTRAMUSCULAR | Status: DC | PRN
Start: 2015-10-02 — End: 2015-10-04

## 2015-10-02 MED ORDER — INSULIN ASPART 100 UNIT/ML ~~LOC~~ SOLN
0.0000 [IU] | Freq: Every day | SUBCUTANEOUS | Status: DC
  Administered 2015-10-02: 2 [IU] via SUBCUTANEOUS
  Filled 2015-10-02: qty 2
  Filled 2015-10-02: qty 3

## 2015-10-02 NOTE — Evaluation (Signed)
Physical Therapy Evaluation Patient Details Name: Burton Roepke MRN: [id number] DOB: [birth year] Today's Date: 10/02/2015   History of Present Illness  65 y/o obese female admitted with acute kidney injury with alkalosis and associated shorteness of breath, O2 sats in the mid 80s on room air on arrival.   Clinical Impression  Pt here with SOB and kidney overload.  She reports she is feeling much better and less swollen but is not confident in her ability to try getting up to sitting or standing today.  She is eager to do some bed exercises and does well with ~10 minutes of exercises apart from the PT exam.  She recently was in rehab and had gotten to where she could take a few steps with HHPT using walker.  Pt generally in electric scooter for mobility.  Pt feels good about going home as she does have a lot of assist coming in and out of the home and only came in because of swelling and breathing issues.  Pt will need to show some mobility before PT can confidently make this recommendation - though pt seems pretty set on the idea.    Follow Up Recommendations Home health PT (pt feels confident going home, recently 6 wks at rehab)    Equipment Recommendations       Recommendations for Other Services       Precautions / Restrictions Precautions Precautions: Fall Restrictions Weight Bearing Restrictions: No      Mobility  Bed Mobility               General bed mobility comments: Pt reports that she is too fatigued and worn out to try mobility/sitting today.    Transfers                    Ambulation/Gait                Stairs            Wheelchair Mobility    Modified Rankin (Stroke Patients Only)       Balance                                             Pertinent Vitals/Pain Pain Score:  (chronic joint arthritic pain)    Home Living Family/patient expects to be discharged to:: Private residence Living Arrangements:  Alone Available Help at Discharge: Available PRN/intermittently (Pt has CNA, paid aides, PT, OT, RN - someone QD)           Home Equipment: Walker - standard;Wheelchair - power;Toilet riser;Grab bars - toilet;Grab bars - tub/shower      Prior Function           Comments: At baseline, patient can transfer from lift chair to power w/c with standard walker, able to get on and off commode with railing, SW to transfer to power w/c.      Hand Dominance        Extremity/Trunk Assessment   Upper Extremity Assessment: Generalized weakness (L shoulder very limited, otherwise grossly 4-/5 t/o)           Lower Extremity Assessment: Generalized weakness (very limited knee ROM 2/2 OA, grossly 3+/5 in available rang)         Communication   Communication: No difficulties  Cognition Arousal/Alertness: Awake/alert Behavior During Therapy: WFL for tasks assessed/performed Overall Cognitive Status: Within Functional  Limits for tasks assessed                      General Comments      Exercises General Exercises - Upper Extremity Elbow Flexion: Strengthening;10 reps;Both Elbow Extension: Strengthening;10 reps;Both General Exercises - Lower Extremity Ankle Circles/Pumps: Strengthening;10 reps Short Arc Quad: Strengthening;10 reps Heel Slides: AROM;Strengthening;10 reps (Pt unable to tolerate knee flexion greater than ) Hip ABduction/ADduction: 10 reps;Strengthening;Both      Assessment/Plan    PT Assessment Patient needs continued PT services  PT Diagnosis Difficulty walking;Generalized weakness   PT Problem List Decreased strength;Decreased range of motion;Decreased activity tolerance;Decreased balance;Decreased mobility;Decreased knowledge of use of DME;Decreased safety awareness;Decreased knowledge of precautions;Cardiopulmonary status limiting activity  PT Treatment Interventions Gait training;Therapeutic activities;Therapeutic exercise;Functional mobility  training;DME instruction;Balance training;Patient/family education   PT Goals (Current goals can be found in the Care Plan section) Acute Rehab PT Goals Patient Stated Goal: "Try to get back to being able to do a little walking" PT Goal Formulation: With patient Time For Goal Achievement: 10/16/15 Potential to Achieve Goals: Fair    Frequency Min 2X/week   Barriers to discharge        Co-evaluation               End of Session Equipment Utilized During Treatment: Oxygen (2 liters, sats in the mid/high 90s) Activity Tolerance: Patient tolerated treatment well Patient left: in bed;with call bell/phone within reach           Time: 1600-1620 PT Time Calculation (min) (ACUTE ONLY): 20 min   Charges:   PT Evaluation $PT Eval Low Complexity: 1 Procedure PT Treatments $Therapeutic Exercise: 8-22 mins   PT G Codes:        Kreg Shropshire, DPT 10/02/2015, 4:44 PM

## 2015-10-02 NOTE — Therapy (Signed)
Spirometry performed.  Patient had a good technique and effort.  Results are as follows:  FeV1/FVC-actual-58.1  Predicted 77  75% of predicted  FeV1-actual- 1.69         Predicted 2.59  65% of predicted

## 2015-10-02 NOTE — Care Management Obs Status (Signed)
[facility] NOTIFICATION   Patient Details  Name: Gabrielle [name] MRN: [id number] Date of Birth: [birth year]   Medicare Observation Status Notification Given:  Yes    Katrina Stack, RN 10/02/2015, 10:19 AM

## 2015-10-02 NOTE — Progress Notes (Signed)
Patient ID: Gabrielle [name], female   DOB: 11/26/1950, 65 y.o.   MRN: [id number]  [facility] PROGRESS NOTE  Gabrielle [name] [id number] DOB: 01-Nov-1950 DOA: 10/01/2015 PCP: Alvester Chou, NP  HPI/Subjective: Patient feels okay. Complains of swelling of her lower extremities. Was sent in for high CO2. Patient complains of neck pain. Patient complains of shortness of breath when off the oxygen. She states that when she was at the rehabilitation she was on oxygen but when she was sent home from the rehabilitation she was not sent home with oxygen.  Objective: Vitals:   10/02/15 1407 10/02/15 1459  BP: 122/61 (!) 119/51  Pulse: 82 87  Resp:  (!) 22  Temp:  98.6 F (37 C)    Filed Weights   10/01/15 2024 10/02/15 0025  Weight: (!) 207.7 kg (458 lb) (!) 200.4 kg (441 lb 14.4 oz)    ROS: Review of Systems  Constitutional: Negative for chills and fever.  Eyes: Negative for blurred vision.  Respiratory: Negative for cough and shortness of breath.   Cardiovascular: Negative for chest pain.  Gastrointestinal: Negative for abdominal pain, constipation, diarrhea, nausea and vomiting.  Genitourinary: Negative for dysuria.  Musculoskeletal: Negative for joint pain.  Neurological: Negative for dizziness and headaches.   Exam: Physical Exam  Constitutional: She is oriented to person, place, and time.  HENT:  Nose: No mucosal edema.  Mouth/Throat: No oropharyngeal exudate or posterior oropharyngeal edema.  Eyes: Conjunctivae, EOM and lids are normal. Pupils are equal, round, and reactive to light.  Neck: No JVD present. Carotid bruit is not present. No edema present. No thyroid mass and no thyromegaly present.  Cardiovascular: S1 normal and S2 normal.  Exam reveals no gallop.   No murmur heard. Pulses:      Dorsalis pedis pulses are 2+ on the right side, and 2+ on the left side.  Respiratory: No respiratory distress. She has no wheezes. She has no rhonchi. She has no rales.  GI: Soft.  Bowel sounds are normal. There is no tenderness.  Musculoskeletal:       Right knee: She exhibits swelling.       Left knee: She exhibits swelling.       Right ankle: She exhibits swelling.       Left ankle: She exhibits swelling.  Lymphadenopathy:    She has no cervical adenopathy.  Neurological: She is alert and oriented to person, place, and time. No cranial nerve deficit.  Skin: Nails show no clubbing.  Erythema and warmth in bilateral lower extremities circumferential on the lower leg and extending bilateral groin  Psychiatric: She has a normal mood and affect.      Data Reviewed: Basic Metabolic Panel:  Recent Labs Lab 10/01/15 2026 10/02/15 0525  NA 139 143  K 4.1 4.2  CL 93* 97*  CO2 36* 38*  GLUCOSE 181* 137*  BUN 39* 38*  CREATININE 1.50* 1.31*  CALCIUM 9.5 8.8*   Liver Function Tests: CBC:  Recent Labs Lab 10/01/15 2026 10/02/15 0525  WBC 8.6 7.8  NEUTROABS 6.2  --   HGB 13.1 11.7*  HCT 38.7 34.7*  MCV 95.2 96.4  PLT 178 162   Cardiac Enzymes:  Recent Labs Lab 10/01/15 2026  TROPONINI <0.03   BNP (last 3 results)  Recent Labs  07/23/15 0459 08/14/15 1330 10/02/15 0525  BNP 110.0* 106.0* 97.0    CBG:  Recent Labs Lab 10/02/15 0126 10/02/15 0752 10/02/15 1241  GLUCAP 149* 121* 159*  Studies: US Carotid Bilateral  Result Date: 10/02/2015 CLINICAL DATA:  Dizziness, hypertension, diabetes and neck pain. EXAM: BILATERAL CAROTID DUPLEX ULTRASOUND TECHNIQUE: Pearline Cables scale imaging, color Doppler and duplex ultrasound were performed of bilateral carotid and vertebral arteries in the neck. COMPARISON:  None. FINDINGS: Criteria: Quantification of carotid stenosis is based on velocity parameters that correlate the residual internal carotid diameter with NASCET-based stenosis levels, using the diameter of the distal internal carotid lumen as the denominator for stenosis measurement. The following velocity measurements were obtained: RIGHT  ICA:  136/40 cm/sec CCA:  [id number] cm/sec SYSTOLIC ICA/CCA RATIO:  1.1 DIASTOLIC ICA/CCA RATIO:  2.5 ECA:  1 7 cm/sec LEFT ICA:  104/22 cm/sec CCA:  [id number] cm/sec SYSTOLIC ICA/CCA RATIO:  0.9 DIASTOLIC ICA/CCA RATIO:  1.2 ECA:  87 cm/sec RIGHT CAROTID ARTERY: There is a mild amount of partially calcified plaque at the level of the distal bulb and proximal ICA. Due to depth of the vessel, Doppler waveforms of the right ICA or poor. Despite mildly increased proximal ICA velocities, it is felt that there is no significant ICA stenosis based on grayscale images as well as velocity ratios. Estimated right ICA stenosis is less than 50%. RIGHT VERTEBRAL ARTERY: Antegrade flow with normal waveform and velocity. LEFT CAROTID ARTERY: No evidence of focal plaque. Velocities and waveforms are normal. No evidence of carotid stenosis. LEFT VERTEBRAL ARTERY: Antegrade flow with normal waveform and velocity. IMPRESSION: 1. Mild amount of plaque in the right carotid bulb and proximal right ICA. Estimated right ICA stenosis is less than 50%. 2. No evidence of left carotid plaque or stenosis. Electronically Signed   By: Aletta Edouard M.D.   On: 10/02/2015 12:31   Dg Chest Port 1 View  Result Date: 10/01/2015 CLINICAL DATA:  Shortness of breath EXAM: PORTABLE CHEST 1 VIEW COMPARISON:  07/21/2015 FINDINGS: Mild enlargement of the cardiopericardial silhouette with indistinct pulmonary vasculature suggesting pulmonary venous hypertension, but no overt edema. Body habitus reduces diagnostic sensitivity and specificity. The right IJ line has been removed. IMPRESSION: 1. Mild enlargement of the cardiopericardial silhouette with pulmonary venous hypertension, but no overt edema. Electronically Signed   By: Van Clines M.D.   On: 10/01/2015 20:52    Scheduled Meds: . enoxaparin (LOVENOX) injection  40 mg Subcutaneous Q12H  . ethadrynate sodium (EDECRIN) IVPB  50 mg Intravenous Daily  . hydrALAZINE  25 mg Oral Q8H  . insulin  aspart  0-5 Units Subcutaneous QHS  . insulin aspart  0-9 Units Subcutaneous TID WC  . metoprolol succinate  100 mg Oral Daily    Assessment/Plan:  1. Chronic respiratory failure with pulse ox of 84% on room air. Patient will qualify for oxygen at home. ABG in the morning to see if we can qualify her for BiPAP at night. Her high CO2 likely secondary to respiratory issues. 2. Anasarca. Patient received fluid overnight oh give IV ethacrynic acid and continue to monitor. 3. Acute kidney injury on chronic kidney disease. Continue to monitor creatinine with diuresis 4. Morbid obesity. Weight loss needed 5. Essential hypertension continue metoprolol and hydralazine 6. Type 2 diabetes mellitus. Patient is on high-dose Lantus at home I will start low-dose Lantus and do sliding scale here. 7. Chronic venous stasis versus cellulitis. IV clindamycin started here.  Code Status:     Code Status Orders        Start     Ordered   10/02/15 0021  Full code  Continuous     10/02/15 0020  Code Status History    Date Active Date Inactive Code Status Order ID Comments User Context   07/21/2015  3:41 AM 07/27/2015  8:21 PM Full Code [id number]  Saundra Shelling, MD Inpatient     Family Communication: Permission to speak in front of visit her. Disposition Plan: Home soon  Antibiotics: - Clindamycin  Time spent: 25 minutes  Loletha Grayer  [employer]

## 2015-10-02 NOTE — Care Management (Addendum)
Patient placed in observation with chief complaint of abnormal labs. Her BUN was much elevated over her baseline and PCO2 was elevated at 36.  Patient with morbid obesity and there is concern that her C02 is elevated due to hypoventilation syndrome.    ABGs in the ED showed a PC02 of 52.  Patient was discharged from [facility] 8/13 to home.  Patient says she asked about home oxygen before discharge and was informed that "I could not have it because I did not have heart failure.  Spoke with attending regarding home bipap for due to sx caused by her OHS. Patient at present has qualified for continuous home 02 with a resting 02 room air sat of 84%.  This value will qualify patient for 48 hours.   Spoke with Corene Cornea from [facility] and advised for hypoventilation syndrome, bipap would be the device of choice.  So far, the ABG qualifies.  Requested order for Spirometry and another ABG upon waking with baseline 02.  Spoke with cardiopulmonary with assistance to how to enter the order for spirometry.  If patient does not qualify for the bipap -she will require an outpatient sleep study- regardless of difficulty leaving the home. Gave heads up referral to [facility] for the oxygen.  patient is followed by [facility] nursing and therapies and agency is aware of admission.  Patient meets inpatient effective 8/25.

## 2015-10-02 NOTE — Progress Notes (Addendum)
SATURATION QUALIFICATIONS:   Patient Saturations on Room Air at Rest = 84 %  Patient Saturations on 2Liters of oxygen at Rest=94%  Please briefly explain why patient needs home oxygen:  desaturation in O2

## 2015-10-02 NOTE — Care Management (Signed)
If patient requires a sleep study, CM was informed that the [facility] at [facility] specializes in bariatric services.

## 2015-10-02 NOTE — Progress Notes (Signed)
   10/02/15 1045 10/02/15 1046  Oxygen Therapy  SpO2 (!) 84 % 94 %  O2 Device Room Air Nasal Cannula  O2 Flow Rate (L/min) --  2 L/min

## 2015-10-03 LAB — BLOOD GAS, ARTERIAL
Acid-Base Excess: 14.7 mmol/L — ABNORMAL HIGH (ref 0.0–3.0)
BICARBONATE: 43.7 meq/L — AB (ref 21.0–28.0)
FIO2: 0.24
O2 Saturation: 95.3 %
PATIENT TEMPERATURE: 37
PH ART: 7.34 — AB (ref 7.350–7.450)
pCO2 arterial: 81 mmHg (ref 32.0–48.0)
pO2, Arterial: 82 mmHg — ABNORMAL LOW (ref 83.0–108.0)

## 2015-10-03 LAB — BASIC METABOLIC PANEL
Anion gap: 6 (ref 5–15)
BUN: 34 mg/dL — ABNORMAL HIGH (ref 6–20)
CALCIUM: 9 mg/dL (ref 8.9–10.3)
CO2: 40 mmol/L — AB (ref 22–32)
CREATININE: 1.29 mg/dL — AB (ref 0.44–1.00)
Chloride: 94 mmol/L — ABNORMAL LOW (ref 101–111)
GFR calc non Af Amer: 43 mL/min — ABNORMAL LOW (ref >60)
GFR, EST AFRICAN AMERICAN: 50 mL/min — AB (ref >60)
Glucose, Bld: 176 mg/dL — ABNORMAL HIGH (ref 65–99)
Potassium: 4.2 mmol/L (ref 3.5–5.1)
Sodium: 140 mmol/L (ref 135–145)

## 2015-10-03 LAB — GLUCOSE, CAPILLARY
GLUCOSE-CAPILLARY: 156 mg/dL — AB (ref 65–99)
GLUCOSE-CAPILLARY: 185 mg/dL — AB (ref 65–99)
GLUCOSE-CAPILLARY: 203 mg/dL — AB (ref 65–99)
GLUCOSE-CAPILLARY: 194 mg/dL — AB (ref 65–99)

## 2015-10-03 MED ORDER — INSULIN GLARGINE 100 UNIT/ML ~~LOC~~ SOLN
50.0000 [IU] | Freq: Every day | SUBCUTANEOUS | Status: DC
  Administered 2015-10-03: 50 [IU] via SUBCUTANEOUS
  Filled 2015-10-03 (×2): qty 0.5

## 2015-10-03 NOTE — Progress Notes (Signed)
[facility] [place] NAME: Gabrielle [name]    MR#:  [id number]  DATE OF BIRTH:  April 11, 1950  SUBJECTIVE:   Pt. ABG this a.m. Showing Hypercapnia.  Renal function improved.  Diuresing well w/ Ethycrinic acid.    REVIEW OF SYSTEMS:    Review of Systems  Constitutional: Negative for chills and fever.  HENT: Negative for congestion and tinnitus.   Eyes: Negative for blurred vision and double vision.  Respiratory: Positive for shortness of breath. Negative for cough and wheezing.   Cardiovascular: Positive for leg swelling. Negative for chest pain, orthopnea and PND.  Gastrointestinal: Negative for abdominal pain, diarrhea, nausea and vomiting.  Genitourinary: Negative for dysuria and hematuria.  Neurological: Negative for dizziness, sensory change and focal weakness.  All other systems reviewed and are negative.   Nutrition: Heart Healthy Tolerating Diet: Yes Tolerating PT: eval noted.   DRUG ALLERGIES:   Allergies  Allergen Reactions  . Lasix [Furosemide]     Numbness and tingling of the face   . Ultram [Tramadol] Other (See Comments)    Pt reports, "makes my head feel funny."  . Erythromycin Rash  . Tetracyclines & Related Rash    VITALS:  Blood pressure (!) 124/47, pulse 87, temperature 98.2 F (36.8 C), temperature source Oral, resp. rate 20, height 5\' 6"  (1.676 m), weight (!) 200.4 kg (441 lb 14.4 oz), SpO2 97 %.  PHYSICAL EXAMINATION:   Physical Exam  GENERAL:  65 y.o.-year-old morbidly obese patient lying in the bed with no acute distress.  EYES: Pupils equal, round, reactive to light and accommodation. No scleral icterus. Extraocular muscles intact.  HEENT: Head atraumatic, normocephalic. Oropharynx and nasopharynx clear.  NECK:  Supple, no jugular venous distention. No thyroid enlargement, no tenderness.  LUNGS: Normal breath sounds bilaterally, no wheezing, rales, rhonchi. No use of accessory muscles of respiration.   CARDIOVASCULAR: S1, S2 normal. No murmurs, rubs, or gallops.  ABDOMEN: Soft, nontender, nondistended. Bowel sounds present. No organomegaly or mass.  EXTREMITIES: No cyanosis, clubbing. +2 edema b/l.  Signs of chronic venous stasis b/l.    NEUROLOGIC: Cranial nerves II through XII are intact. No focal Motor or sensory deficits b/l. Globally weak.   PSYCHIATRIC: The patient is alert and oriented x 3.  SKIN: No obvious rash, lesion, or ulcer.    LABORATORY PANEL:   CBC  Recent Labs Lab 10/02/15 0525  WBC 7.8  HGB 11.7*  HCT 34.7*  PLT 162   ------------------------------------------------------------------------------------------------------------------  Chemistries   Recent Labs Lab 10/01/15 2026  10/03/15 0505  NA 139  < > 140  K 4.1  < > 4.2  CL 93*  < > 94*  CO2 36*  < > 40*  GLUCOSE 181*  < > 176*  BUN 39*  < > 34*  CREATININE 1.50*  < > 1.29*  CALCIUM 9.5  < > 9.0  AST 21  --   --   ALT 16  --   --   ALKPHOS 50  --   --   BILITOT 1.1  --   --   < > = values in this interval not displayed. ------------------------------------------------------------------------------------------------------------------  Cardiac Enzymes  Recent Labs Lab 10/01/15 2026  TROPONINI <0.03   ------------------------------------------------------------------------------------------------------------------  RADIOLOGY:  US Carotid Bilateral  Result Date: 10/02/2015 CLINICAL DATA:  Dizziness, hypertension, diabetes and neck pain. EXAM: BILATERAL CAROTID DUPLEX ULTRASOUND TECHNIQUE: Pearline Cables scale imaging, color Doppler and duplex ultrasound were performed of bilateral carotid and vertebral arteries  in the neck. COMPARISON:  None. FINDINGS: Criteria: Quantification of carotid stenosis is based on velocity parameters that correlate the residual internal carotid diameter with NASCET-based stenosis levels, using the diameter of the distal internal carotid lumen as the denominator for stenosis  measurement. The following velocity measurements were obtained: RIGHT ICA:  136/40 cm/sec CCA:  [id number] cm/sec SYSTOLIC ICA/CCA RATIO:  1.1 DIASTOLIC ICA/CCA RATIO:  2.5 ECA:  1 7 cm/sec LEFT ICA:  104/22 cm/sec CCA:  [id number] cm/sec SYSTOLIC ICA/CCA RATIO:  0.9 DIASTOLIC ICA/CCA RATIO:  1.2 ECA:  87 cm/sec RIGHT CAROTID ARTERY: There is a mild amount of partially calcified plaque at the level of the distal bulb and proximal ICA. Due to depth of the vessel, Doppler waveforms of the right ICA or poor. Despite mildly increased proximal ICA velocities, it is felt that there is no significant ICA stenosis based on grayscale images as well as velocity ratios. Estimated right ICA stenosis is less than 50%. RIGHT VERTEBRAL ARTERY: Antegrade flow with normal waveform and velocity. LEFT CAROTID ARTERY: No evidence of focal plaque. Velocities and waveforms are normal. No evidence of carotid stenosis. LEFT VERTEBRAL ARTERY: Antegrade flow with normal waveform and velocity. IMPRESSION: 1. Mild amount of plaque in the right carotid bulb and proximal right ICA. Estimated right ICA stenosis is less than 50%. 2. No evidence of left carotid plaque or stenosis. Electronically Signed   By: Aletta Edouard M.D.   On: 10/02/2015 12:31   Dg Chest Port 1 View  Result Date: 10/01/2015 CLINICAL DATA:  Shortness of breath EXAM: PORTABLE CHEST 1 VIEW COMPARISON:  07/21/2015 FINDINGS: Mild enlargement of the cardiopericardial silhouette with indistinct pulmonary vasculature suggesting pulmonary venous hypertension, but no overt edema. Body habitus reduces diagnostic sensitivity and specificity. The right IJ line has been removed. IMPRESSION: 1. Mild enlargement of the cardiopericardial silhouette with pulmonary venous hypertension, but no overt edema. Electronically Signed   By: Van Clines M.D.   On: 10/01/2015 20:52     ASSESSMENT AND PLAN:   65 yo female w/ Morbid obesity, OSA, Obesity Pickwickian syndrome, OA, DM, who  presented to the hospital due to acute on chronic resp. Failure with Hypercapnia and also AKI.   1. AKI - improved and resolved.   - cont. Ethacrynic Acid for diuresis and Cr. Stable.   2. Acute on chronic resp. Failure w/ Hypercapnia - ABG this a.m. Showing hypercarbia but normal pH.  - pt. Likely with benefit from Bipap at home.  - does qualify for Home O2 given chronic resp. Failure to OSA, Pickwickian syndrome.   3. LE edema/Anasarca - due to diastolic CHF, OSA - cont. Diuresis w/ IV Ethacrynic acid and improving.  - has signs of chronic venous statis on Le b/l.  ?? UNNA boot.   4. DM Type II - cont. Lantus, SSI and follow sugars  5. HTN - cont. Lopressor.   Likely d/c home tomorrow with [facility] health services.  Discussed w/ CM.      All the records are reviewed and case discussed with Care Management/Social Worker. Management plans discussed with the patient, family and they are in agreement.  CODE STATUS: Full   DVT Prophylaxis: Lovenox  TOTAL TIME TAKING CARE OF THIS PATIENT: 30 minutes.   POSSIBLE D/C IN 1-2 DAYS, DEPENDING ON CLINICAL CONDITION.   Henreitta Leber M.D on 10/03/2015 at 1:04 PM  Between 7am to 6pm - Pager - [phone]  After 6pm go to [email or web address] - Patent attorney  Hospitalists  Office  [phone]  CC: Primary care physician; Alvester Chou, NP

## 2015-10-03 NOTE — Progress Notes (Signed)
ABG drawn by RT this AM. Gas yielded a critical value PCO2 81. Results called to DR Marcille Blanco. No new orders given. Pt on 2LNC in no distress. O2 Sat 96%.

## 2015-10-03 NOTE — Care Management Note (Signed)
Case Management Note  Patient Details  Name: Doralee Flot MRN: [id number] Date of Birth: [birth year]  Subjective/Objective:        All information regarding qualifying for a home Bipap was faxed to [facility] earlier today. This Probation officer requested to Ms Leighton nurse that O2 progression Sat measurements be charted to see if Ms Maywald qualifies for home oxygen. Ms Badolato is not very mobile and prefers to remain in bed.              Action/Plan:   Expected Discharge Date:                  Expected Discharge Plan:     In-House Referral:     Discharge planning Services     Post Acute Care Choice:    Choice offered to:     DME Arranged:    DME Agency:     HH Arranged:    HH Agency:     Status of Service:     If discussed at [employer] of Stay Meetings, dates discussed:    Additional Comments:  Christapher Gillian A, RN 10/03/2015, 5:06 PM

## 2015-10-04 LAB — BASIC METABOLIC PANEL
ANION GAP: 10 (ref 5–15)
BUN: 33 mg/dL — AB (ref 6–20)
CALCIUM: 9.1 mg/dL (ref 8.9–10.3)
CO2: 36 mmol/L — AB (ref 22–32)
CREATININE: 1.22 mg/dL — AB (ref 0.44–1.00)
Chloride: 93 mmol/L — ABNORMAL LOW (ref 101–111)
GFR calc Af Amer: 54 mL/min — ABNORMAL LOW (ref >60)
GFR, EST NON AFRICAN AMERICAN: 46 mL/min — AB (ref >60)
GLUCOSE: 220 mg/dL — AB (ref 65–99)
Potassium: 4.3 mmol/L (ref 3.5–5.1)
Sodium: 139 mmol/L (ref 135–145)

## 2015-10-04 LAB — GLUCOSE, CAPILLARY
Glucose-Capillary: 200 mg/dL — ABNORMAL HIGH (ref 65–99)
Glucose-Capillary: 272 mg/dL — ABNORMAL HIGH (ref 65–99)
Glucose-Capillary: 201 mg/dL — ABNORMAL HIGH (ref 65–99)

## 2015-10-04 MED ORDER — INSULIN GLARGINE 100 UNIT/ML ~~LOC~~ SOLN: 50.0000 [IU] | Freq: Every day | SUBCUTANEOUS | 11 refills | Status: DC

## 2015-10-04 NOTE — Care Management Note (Signed)
Case Management Note  Patient Details  Name: Lujean Defino MRN: [id number] Date of Birth: 11-21-1950  Subjective/Objective:      Home oxygen is set up at Ms Wurts residence per [place]. Ms Nakano requests to wait until her sister arrives at [facility] before staff calls EMS for transport home.               Action/Plan:   Expected Discharge Date:                  Expected Discharge Plan:     In-House Referral:     Discharge planning Services     Post Acute Care Choice:    Choice offered to:     DME Arranged:    DME Agency:     HH Arranged:    HH Agency:     Status of Service:     If discussed at [employer] of Stay Meetings, dates discussed:    Additional Comments:  Ed Rayson A, RN 10/04/2015, 2:36 PM

## 2015-10-04 NOTE — Progress Notes (Signed)
SATURATION QUALIFICATIONS: (This note is used to comply with regulatory documentation for home oxygen)  Patient Saturations on Room Air at Rest = 85%    Patient Saturations on oxygen with exercise = xxxx%  Patient Saturations on 2 Liters of oxygen while at rest = 98%  Please briefly explain why patient needs home oxygen:  Bedbound. Chronic pain. Unable to obtain O2 sats during exercise. Weight 441lbs. OSA, Pickwickian Syndrome.

## 2015-10-04 NOTE — Care Management Note (Signed)
Case Management Note  Patient Details  Name: Gabrielle [name] MRN: [id number] Date of Birth: [birth year]  Subjective/Objective:    Updated Gabrielle [name] nurse Cecille Rubin that we still need O2 Sat progression trials to qualify Gabrielle [name] for home oxygen. Yesterday this Probation officer faxed all information to [facility] to see if Gabrielle [name] qualifies for a home Bipap. Discussed this with Gabrielle [name] at [facility] who reviewed the ABG, etc. Gabrielle [name] reports that Gabrielle [name] must have a sleep study and documentation of a current CPAP failure before she would be considered for Bipap.                 Action/Plan:   Expected Discharge Date:                  Expected Discharge Plan:     In-House Referral:     Discharge planning Services     Post Acute Care Choice:    Choice offered to:     DME Arranged:    DME Agency:     HH Arranged:    HH Agency:     Status of Service:     If discussed at [employer] of Stay Meetings, dates discussed:    Additional Comments:  Esabella Stockinger A, RN 10/04/2015, 8:19 AM

## 2015-10-04 NOTE — Care Management Note (Addendum)
Case Management Note  Patient Details  Name: Bodhi Daluz MRN: [id number] Date of Birth: Feb 17, 1950  Subjective/Objective:      A referral for home health PT and RN was faxed and called to [facility]. A referral for new home oxygen was faxed to [place] with request for  home oxygen set-up today so that pt can be discharged home. Information given to [place] at [place]..              Action/Plan:   Expected Discharge Date:                  Expected Discharge Plan:     In-House Referral:     Discharge planning Services     Post Acute Care Choice:    Choice offered to:     DME Arranged:    DME Agency:     HH Arranged:    HH Agency:     Status of Service:     If discussed at [place] of Stay Meetings, dates discussed:    Additional Comments:  Lenay Lovejoy A, RN 10/04/2015, 11:29 AM

## 2015-10-04 NOTE — Discharge Summary (Signed)
[facility] [place] NAME: Gabrielle [name]    MR#:  [id number]  DATE OF BIRTH:  [birth year]  DATE OF ADMISSION:  10/01/2015 ADMITTING PHYSICIAN: Lance Coon, MD  DATE OF DISCHARGE: 10/04/2015  PRIMARY CARE PHYSICIAN: Alvester Chou, NP    ADMISSION DIAGNOSIS:  abnormal Labs  DISCHARGE DIAGNOSIS:  Principal Problem:   AKI (acute kidney injury) ([place]) Active Problems:   Uncontrolled hypertension   Diabetes ([facility])   SOB (shortness of breath)   Acute on chronic kidney failure ([place])   SECONDARY DIAGNOSIS:   Past Medical History:  Diagnosis Date  . Arthritis   . Diabetes mellitus without complication ([place])   . Hypertension   . Osteoarthritis     HOSPITAL COURSE:   65 year old female with past medical history of morbid obesity, diabetes, hypertension, osteoarthritis, obstructive sleep apnea, obesity pickwickian syndrome who presented to the hospital due to acute renal failure and also acute on chronic respiratory failure with hypoxia and hypercapnia.  1. AKI - she presented to the hospital with a creatinine as high as 1.5. - She was given some gentle IV fluids and her BUN and creatinine has since then improved and is currently close to baseline. -Her home dose diuretics have been resumed and she will continue that upon discharge.  2. Acute on chronic resp. Failure w/ Hypercapnia - patient has obesity pickwickian syndrome with chronic hypoxia and hypercapnia. -She was arranged for home oxygen prior to discharge. She would likely benefit from CPAP/BiPAP but needs to have a outpatient sleep study prior to having this arranged. This can be further addressed as an outpatient.  3. LE edema/Anasarca - due to diastolic CHF, OSA -Patient was diuresed with IV ethacrynic acid and has remained stable on it. She will resume her home oral dose upon discharge. - pt. has signs of chronic venous statis on Le b/l without evidence of cellulitis.  She may  benefit from an [facility] boot if tolerated and this can be done as outpatient.   4. DM Type II - pt. Will cont. Lantus, Lispro with meals.   5. HTN - pt. Will resume her hydralazine upon discharge.   Will d/c patient home with Home health PT, RN.     DISCHARGE CONDITIONS:   Stable.   CONSULTS OBTAINED:    DRUG ALLERGIES:   Allergies  Allergen Reactions  . Lasix [Furosemide]     Numbness and tingling of the face   . Ultram [Tramadol] Other (See Comments)    Pt reports, "makes my head feel funny."  . Erythromycin Rash  . Tetracyclines & Related Rash    DISCHARGE MEDICATIONS:     Medication List    TAKE these medications   acetaminophen 500 MG tablet Commonly known as:  TYLENOL Take 500 mg by mouth 3 (three) times daily.   ethacrynic acid 25 MG tablet Commonly known as:  EDECRIN Take 2 tablets (50 mg total) by mouth daily. What changed:  how much to take  when to take this   fluticasone 50 MCG/ACT nasal spray Commonly known as:  FLONASE Place 1 spray into both nostrils daily.   hydrALAZINE 25 MG tablet Commonly known as:  APRESOLINE Take 1 tablet (25 mg total) by mouth every 8 (eight) hours. What changed:  how much to take  additional instructions   insulin glargine 100 UNIT/ML injection Commonly known as:  LANTUS Inject 0.5 mLs (50 Units total) into the skin daily. What changed:  how much to take  insulin lispro 100 UNIT/ML injection Commonly known as:  HUMALOG Inject into the skin 3 (three) times daily with meals. Per sliding scale 1 units per 10 grams of carbs   LORazepam 0.5 MG tablet Commonly known as:  ATIVAN Take 1 tablet (0.5 mg total) by mouth every 8 (eight) hours as needed for anxiety.   metolazone 2.5 MG tablet Commonly known as:  ZAROXOLYN Take 2.5 mg by mouth 2 (two) times a week. Patient takes on Monday and Thursday   nabumetone 500 MG tablet Commonly known as:  RELAFEN Take 1 tablet by mouth daily.   polyethylene glycol  packet Commonly known as:  MIRALAX / GLYCOLAX Take 17 g by mouth daily.   vitamin B-12 1000 MCG tablet Commonly known as:  CYANOCOBALAMIN Take 1,000 mcg by mouth daily.   Vitamin D3 1000 units Caps Take 2,000 Units by mouth daily.         DISCHARGE INSTRUCTIONS:   DIET:  Cardiac diet and Diabetic diet  DISCHARGE CONDITION:  Stable  ACTIVITY:  Activity as tolerated  OXYGEN:  Home Oxygen: Yes.     Oxygen Delivery: 2 liters/min via Patient connected to nasal cannula oxygen  DISCHARGE LOCATION:  Home with Home health PT, RN.    If you experience worsening of your admission symptoms, develop shortness of breath, life threatening emergency, suicidal or homicidal thoughts you must seek medical attention immediately by calling 911 or calling your MD immediately  if symptoms less severe.  You Must read complete instructions/literature along with all the possible adverse reactions/side effects for all the Medicines you take and that have been prescribed to you. Take any new Medicines after you have completely understood and accpet all the possible adverse reactions/side effects.   Please note  You were cared for by a hospitalist during your hospital stay. If you have any questions about your discharge medications or the care you received while you were in the hospital after you are discharged, you can call the unit and asked to speak with the hospitalist on call if the hospitalist that took care of you is not available. Once you are discharged, your primary care physician will handle any further medical issues. Please note that NO REFILLS for any discharge medications will be authorized once you are discharged, as it is imperative that you return to your primary care physician (or establish a relationship with a primary care physician if you do not have one) for your aftercare needs so that they can reassess your need for medications and monitor your lab values.     Today   Cr.  Stable.  No shortness of breath, N/V or any other complaints.  D/c home today.   VITAL SIGNS:  Blood pressure (!) 126/58, pulse 86, temperature 97.4 F (36.3 C), temperature source Oral, resp. rate 20, height 5\' 6"  (1.676 m), weight (!) 200.4 kg (441 lb 14.4 oz), SpO2 (!) 85 %.  I/O:    Intake/Output Summary (Last 24 hours) at 10/04/15 1330 Last data filed at 10/04/15 0400  Gross per 24 hour  Intake              720 ml  Output              700 ml  Net               20 ml    PHYSICAL EXAMINATION:   GENERAL:  65 y.o.-year-old morbidly obese patient lying in the bed with no acute distress.  EYES:  Pupils equal, round, reactive to light and accommodation. No scleral icterus. Extraocular muscles intact.  HEENT: Head atraumatic, normocephalic. Oropharynx and nasopharynx clear.  NECK:  Supple, no jugular venous distention. No thyroid enlargement, no tenderness.  LUNGS: Normal breath sounds bilaterally, no wheezing, rales, rhonchi. No use of accessory muscles of respiration.  CARDIOVASCULAR: S1, S2 normal. No murmurs, rubs, or gallops.  ABDOMEN: Soft, nontender, nondistended. Bowel sounds present. No organomegaly or mass.  EXTREMITIES: No cyanosis, clubbing. +2 edema b/l.  Signs of chronic venous stasis b/l.    NEUROLOGIC: Cranial nerves II through XII are intact. No focal Motor or sensory deficits b/l. Globally weak.   PSYCHIATRIC: The patient is alert and oriented x 3.  SKIN: No obvious rash, lesion, or ulcer.   DATA REVIEW:   CBC  Recent Labs Lab 10/02/15 0525  WBC 7.8  HGB 11.7*  HCT 34.7*  PLT 162    Chemistries   Recent Labs Lab 10/01/15 2026  10/04/15 0423  NA 139  < > 139  K 4.1  < > 4.3  CL 93*  < > 93*  CO2 36*  < > 36*  GLUCOSE 181*  < > 220*  BUN 39*  < > 33*  CREATININE 1.50*  < > 1.22*  CALCIUM 9.5  < > 9.1  AST 21  --   --   ALT 16  --   --   ALKPHOS 50  --   --   BILITOT 1.1  --   --   < > = values in this interval not displayed.  Cardiac  Enzymes  Recent Labs Lab 10/01/15 2026  TROPONINI <0.03    Microbiology Results  No results found for this or any previous visit.  RADIOLOGY:  No results found.    Management plans discussed with the patient, family and they are in agreement.  CODE STATUS:     Code Status Orders        Start     Ordered   10/02/15 0021  Full code  Continuous     10/02/15 0020    Code Status History    Date Active Date Inactive Code Status Order ID Comments User Context   07/21/2015  3:41 AM 07/27/2015  8:21 PM Full Code [id number]  Saundra Shelling, MD Inpatient      TOTAL TIME TAKING CARE OF THIS PATIENT: 40 minutes.    Henreitta Leber M.D on 10/04/2015 at 1:30 PM  Between 7am to 6pm - Pager - [phone]  After 6pm go to [email or web address] - Proofreader  [facility]  Office  [phone]  CC: Primary care physician; Alvester Chou, NP

## 2015-11-19 ENCOUNTER — Ambulatory Visit: Payer: Medicare Other | Attending: Internal Medicine

## 2015-11-19 DIAGNOSIS — Z6841 Body Mass Index (BMI) 40.0 and over, adult: Secondary | ICD-10-CM | POA: Insufficient documentation

## 2015-11-19 DIAGNOSIS — I509 Heart failure, unspecified: Secondary | ICD-10-CM | POA: Insufficient documentation

## 2015-11-19 DIAGNOSIS — I272 Pulmonary hypertension, unspecified: Secondary | ICD-10-CM | POA: Diagnosis not present

## 2015-11-19 DIAGNOSIS — G4733 Obstructive sleep apnea (adult) (pediatric): Secondary | ICD-10-CM | POA: Insufficient documentation

## 2016-04-29 ENCOUNTER — Inpatient Hospital Stay
Admission: EM | Admit: 2016-04-29 | Discharge: 2016-05-04 | DRG: 872 | Disposition: A | Discharge status: Skilled Nursing Facility | Payer: Medicare Other | Attending: Internal Medicine | Admitting: Internal Medicine

## 2016-04-29 ENCOUNTER — Encounter: Payer: Self-pay | Admitting: Emergency Medicine

## 2016-04-29 DIAGNOSIS — I878 Other specified disorders of veins: Secondary | ICD-10-CM | POA: Diagnosis present

## 2016-04-29 DIAGNOSIS — Z6841 Body Mass Index (BMI) 40.0 and over, adult: Secondary | ICD-10-CM

## 2016-04-29 DIAGNOSIS — M1990 Unspecified osteoarthritis, unspecified site: Secondary | ICD-10-CM | POA: Diagnosis present

## 2016-04-29 DIAGNOSIS — Z9119 Patient's noncompliance with other medical treatment and regimen: Secondary | ICD-10-CM

## 2016-04-29 DIAGNOSIS — I10 Essential (primary) hypertension: Secondary | ICD-10-CM | POA: Diagnosis present

## 2016-04-29 DIAGNOSIS — L03115 Cellulitis of right lower limb: Secondary | ICD-10-CM | POA: Diagnosis present

## 2016-04-29 DIAGNOSIS — F419 Anxiety disorder, unspecified: Secondary | ICD-10-CM | POA: Diagnosis present

## 2016-04-29 DIAGNOSIS — I272 Pulmonary hypertension, unspecified: Secondary | ICD-10-CM | POA: Diagnosis present

## 2016-04-29 DIAGNOSIS — E119 Type 2 diabetes mellitus without complications: Secondary | ICD-10-CM | POA: Diagnosis present

## 2016-04-29 DIAGNOSIS — I872 Venous insufficiency (chronic) (peripheral): Secondary | ICD-10-CM | POA: Diagnosis present

## 2016-04-29 DIAGNOSIS — Z9981 Dependence on supplemental oxygen: Secondary | ICD-10-CM

## 2016-04-29 DIAGNOSIS — R531 Weakness: Secondary | ICD-10-CM

## 2016-04-29 DIAGNOSIS — G4733 Obstructive sleep apnea (adult) (pediatric): Secondary | ICD-10-CM | POA: Diagnosis present

## 2016-04-29 DIAGNOSIS — L0390 Cellulitis, unspecified: Secondary | ICD-10-CM | POA: Diagnosis present

## 2016-04-29 DIAGNOSIS — A4152 Sepsis due to Pseudomonas: Secondary | ICD-10-CM | POA: Diagnosis present

## 2016-04-29 DIAGNOSIS — Z794 Long term (current) use of insulin: Secondary | ICD-10-CM

## 2016-04-29 DIAGNOSIS — J9612 Chronic respiratory failure with hypercapnia: Secondary | ICD-10-CM | POA: Diagnosis present

## 2016-04-29 DIAGNOSIS — J9621 Acute and chronic respiratory failure with hypoxia: Secondary | ICD-10-CM | POA: Diagnosis not present

## 2016-04-29 DIAGNOSIS — R609 Edema, unspecified: Secondary | ICD-10-CM

## 2016-04-29 DIAGNOSIS — J9622 Acute and chronic respiratory failure with hypercapnia: Secondary | ICD-10-CM | POA: Diagnosis not present

## 2016-04-29 DIAGNOSIS — E662 Morbid (severe) obesity with alveolar hypoventilation: Secondary | ICD-10-CM | POA: Diagnosis not present

## 2016-04-29 DIAGNOSIS — Z79899 Other long term (current) drug therapy: Secondary | ICD-10-CM | POA: Diagnosis not present

## 2016-04-29 HISTORY — DX: Venous insufficiency (chronic) (peripheral): I87.2

## 2016-04-29 HISTORY — DX: Sleep apnea, unspecified: G47.30

## 2016-04-29 HISTORY — DX: Morbid (severe) obesity due to excess calories: E66.01

## 2016-04-29 LAB — BASIC METABOLIC PANEL
ANION GAP: 11 (ref 5–15)
BUN: 37 mg/dL — ABNORMAL HIGH (ref 6–20)
CALCIUM: 9.4 mg/dL (ref 8.9–10.3)
CO2: 36 mmol/L — AB (ref 22–32)
CREATININE: 1.03 mg/dL — AB (ref 0.44–1.00)
Chloride: 89 mmol/L — ABNORMAL LOW (ref 101–111)
GFR, EST NON AFRICAN AMERICAN: 57 mL/min — AB (ref >60)
Glucose, Bld: 215 mg/dL — ABNORMAL HIGH (ref 65–99)
Potassium: 3.8 mmol/L (ref 3.5–5.1)
SODIUM: 136 mmol/L (ref 135–145)

## 2016-04-29 LAB — URINALYSIS, COMPLETE (UACMP) WITH MICROSCOPIC
BILIRUBIN URINE: NEGATIVE
Bacteria, UA: NONE SEEN
GLUCOSE, UA: NEGATIVE mg/dL
Hgb urine dipstick: NEGATIVE
Ketones, ur: NEGATIVE mg/dL
Leukocytes, UA: NEGATIVE
NITRITE: NEGATIVE
PH: 5 (ref 5.0–8.0)
PROTEIN: NEGATIVE mg/dL
RBC / HPF: NONE SEEN RBC/hpf (ref 0–5)
SPECIFIC GRAVITY, URINE: 1.016 (ref 1.005–1.030)
Squamous Epithelial / LPF: NONE SEEN
WBC, UA: NONE SEEN WBC/hpf (ref 0–5)

## 2016-04-29 LAB — CBC
HCT: 34.5 % — ABNORMAL LOW (ref 35.0–47.0)
HEMOGLOBIN: 11.7 g/dL — AB (ref 12.0–16.0)
MCH: 31.6 pg (ref 26.0–34.0)
MCHC: 34 g/dL (ref 32.0–36.0)
MCV: 92.8 fL (ref 80.0–100.0)
PLATELETS: 188 10*3/uL (ref 150–440)
RBC: 3.72 MIL/uL — AB (ref 3.80–5.20)
RDW: 14.7 % — ABNORMAL HIGH (ref 11.5–14.5)
WBC: 16.2 10*3/uL — AB (ref 3.6–11.0)

## 2016-04-29 LAB — GLUCOSE, CAPILLARY
GLUCOSE-CAPILLARY: 168 mg/dL — AB (ref 65–99)
Glucose-Capillary: 181 mg/dL — ABNORMAL HIGH (ref 65–99)

## 2016-04-29 MED ORDER — INSULIN ASPART 100 UNIT/ML ~~LOC~~ SOLN
3.0000 [IU] | Freq: Three times a day (TID) | SUBCUTANEOUS | Status: DC
  Administered 2016-04-29 – 2016-05-04 (×15): 3 [IU] via SUBCUTANEOUS
  Filled 2016-04-29 (×3): qty 3
  Filled 2016-04-29: qty 6
  Filled 2016-04-29 (×4): qty 3
  Filled 2016-04-29: qty 6
  Filled 2016-04-29 (×2): qty 3
  Filled 2016-04-29: qty 7
  Filled 2016-04-29: qty 8
  Filled 2016-04-29: qty 3

## 2016-04-29 MED ORDER — INSULIN ASPART 100 UNIT/ML ~~LOC~~ SOLN
0.0000 [IU] | Freq: Every day | SUBCUTANEOUS | Status: DC
  Administered 2016-04-30 – 2016-05-01 (×2): 2 [IU] via SUBCUTANEOUS
  Administered 2016-05-02: 3 [IU] via SUBCUTANEOUS
  Filled 2016-04-29: qty 2
  Filled 2016-04-29: qty 3
  Filled 2016-04-29: qty 2

## 2016-04-29 MED ORDER — MEROPENEM-SODIUM CHLORIDE 1 GM/50ML IV SOLR
1.0000 g | Freq: Three times a day (TID) | INTRAVENOUS | Status: DC
  Filled 2016-04-29: qty 50

## 2016-04-29 MED ORDER — VITAMIN B-12 1000 MCG PO TABS
1000.0000 ug | ORAL_TABLET | ORAL | Status: DC
  Administered 2016-05-02 – 2016-05-04 (×2): 1000 ug via ORAL
  Filled 2016-04-29 (×3): qty 1

## 2016-04-29 MED ORDER — SODIUM CHLORIDE 0.9 % IV SOLN
1.0000 g | Freq: Once | INTRAVENOUS | Status: DC
  Filled 2016-04-29: qty 1

## 2016-04-29 MED ORDER — POLYETHYLENE GLYCOL 3350 17 G PO PACK
17.0000 g | PACK | Freq: Every day | ORAL | Status: DC
  Administered 2016-04-30 – 2016-05-03 (×4): 17 g via ORAL
  Filled 2016-04-29 (×4): qty 1

## 2016-04-29 MED ORDER — VITAMIN D 1000 UNITS PO TABS
1000.0000 [IU] | ORAL_TABLET | ORAL | Status: DC
  Administered 2016-05-02 – 2016-05-04 (×2): 1000 [IU] via ORAL
  Filled 2016-04-29 (×3): qty 1

## 2016-04-29 MED ORDER — NABUMETONE 500 MG PO TABS
500.0000 mg | ORAL_TABLET | Freq: Every day | ORAL | Status: DC
  Administered 2016-04-30 – 2016-05-04 (×5): 500 mg via ORAL
  Filled 2016-04-29 (×6): qty 1

## 2016-04-29 MED ORDER — METOLAZONE 2.5 MG PO TABS
2.5000 mg | ORAL_TABLET | ORAL | Status: DC
  Administered 2016-05-02: 2.5 mg via ORAL
  Filled 2016-04-29: qty 1

## 2016-04-29 MED ORDER — ETHACRYNATE SODIUM 50 MG IV SOLR
50.0000 mg | Freq: Two times a day (BID) | INTRAVENOUS | Status: DC
  Administered 2016-04-29 (×2): 50 mg via INTRAVENOUS
  Filled 2016-04-29 (×4): qty 50

## 2016-04-29 MED ORDER — NYSTATIN 100000 UNIT/GM EX POWD
Freq: Two times a day (BID) | CUTANEOUS | Status: DC
  Administered 2016-04-29 – 2016-05-03 (×7): via TOPICAL
  Filled 2016-04-29: qty 15

## 2016-04-29 MED ORDER — SODIUM CHLORIDE 0.9 % IV SOLN: 1.0000 g | Freq: Three times a day (TID) | INTRAVENOUS | Status: DC

## 2016-04-29 MED ORDER — VANCOMYCIN HCL 10 G IV SOLR
1500.0000 mg | Freq: Once | INTRAVENOUS | Status: AC
  Administered 2016-04-29: 1500 mg via INTRAVENOUS
  Filled 2016-04-29: qty 1500

## 2016-04-29 MED ORDER — INSULIN GLARGINE 100 UNIT/ML ~~LOC~~ SOLN
85.0000 [IU] | Freq: Every day | SUBCUTANEOUS | Status: DC
  Administered 2016-04-30 – 2016-05-04 (×5): 85 [IU] via SUBCUTANEOUS
  Filled 2016-04-29 (×5): qty 0.85

## 2016-04-29 MED ORDER — VANCOMYCIN HCL IN DEXTROSE 1-5 GM/200ML-% IV SOLN: 1000.0000 mg | Freq: Once | INTRAVENOUS | Status: DC

## 2016-04-29 MED ORDER — MEROPENEM-SODIUM CHLORIDE 1 GM/50ML IV SOLR
1.0000 g | Freq: Three times a day (TID) | INTRAVENOUS | Status: DC
  Filled 2016-04-29 (×2): qty 50

## 2016-04-29 MED ORDER — MEROPENEM-SODIUM CHLORIDE 1 GM/50ML IV SOLR
1.0000 g | Freq: Once | INTRAVENOUS | Status: AC
  Administered 2016-04-29: 1 g via INTRAVENOUS
  Filled 2016-04-29: qty 50

## 2016-04-29 MED ORDER — FLUTICASONE PROPIONATE 50 MCG/ACT NA SUSP
1.0000 | Freq: Every day | NASAL | Status: DC
  Administered 2016-04-29 – 2016-05-04 (×6): 1 via NASAL
  Filled 2016-04-29: qty 16

## 2016-04-29 MED ORDER — ACETAMINOPHEN 325 MG PO TABS
650.0000 mg | ORAL_TABLET | Freq: Four times a day (QID) | ORAL | Status: DC | PRN
  Administered 2016-04-29 – 2016-05-04 (×10): 650 mg via ORAL
  Filled 2016-04-29 (×10): qty 2

## 2016-04-29 MED ORDER — METOPROLOL TARTRATE 25 MG PO TABS
12.5000 mg | ORAL_TABLET | Freq: Two times a day (BID) | ORAL | Status: DC
  Filled 2016-04-29: qty 1

## 2016-04-29 MED ORDER — LORAZEPAM 0.5 MG PO TABS
0.5000 mg | ORAL_TABLET | Freq: Three times a day (TID) | ORAL | Status: DC | PRN
  Administered 2016-05-01 – 2016-05-04 (×5): 0.5 mg via ORAL
  Filled 2016-04-29 (×5): qty 1

## 2016-04-29 MED ORDER — ENOXAPARIN SODIUM 40 MG/0.4ML ~~LOC~~ SOLN
40.0000 mg | Freq: Two times a day (BID) | SUBCUTANEOUS | Status: DC
  Administered 2016-04-29 – 2016-05-04 (×9): 40 mg via SUBCUTANEOUS
  Filled 2016-04-29 (×9): qty 0.4

## 2016-04-29 MED ORDER — MEROPENEM-SODIUM CHLORIDE 1 GM/50ML IV SOLR
1.0000 g | Freq: Three times a day (TID) | INTRAVENOUS | Status: DC
  Administered 2016-04-30: 1 g via INTRAVENOUS
  Filled 2016-04-29 (×4): qty 50

## 2016-04-29 MED ORDER — INSULIN ASPART 100 UNIT/ML ~~LOC~~ SOLN
0.0000 [IU] | Freq: Three times a day (TID) | SUBCUTANEOUS | Status: DC
  Administered 2016-04-29 – 2016-05-01 (×7): 2 [IU] via SUBCUTANEOUS
  Administered 2016-05-02 (×2): 3 [IU] via SUBCUTANEOUS
  Administered 2016-05-02: 2 [IU] via SUBCUTANEOUS
  Administered 2016-05-03 (×2): 3 [IU] via SUBCUTANEOUS
  Administered 2016-05-03: 5 [IU] via SUBCUTANEOUS
  Administered 2016-05-04 (×2): 3 [IU] via SUBCUTANEOUS
  Filled 2016-04-29: qty 2
  Filled 2016-04-29: qty 3
  Filled 2016-04-29 (×6): qty 2
  Filled 2016-04-29: qty 5
  Filled 2016-04-29: qty 6
  Filled 2016-04-29: qty 2

## 2016-04-29 MED ORDER — SODIUM CHLORIDE 0.9 % IV SOLN
1250.0000 mg | Freq: Two times a day (BID) | INTRAVENOUS | Status: DC
  Administered 2016-04-29 – 2016-04-30 (×2): 1250 mg via INTRAVENOUS
  Filled 2016-04-29 (×3): qty 1250

## 2016-04-29 NOTE — ED Triage Notes (Addendum)
Pt ems from home for weakness, right lower ext cellulitis, increased blood sugar. Pt is home bound, on 2 lNC. Being treated for cellulitis but has not seen MD in 6 weeks. Started taking left over antibiotics several weeks ago. Hx of DM, HTN, arthritis

## 2016-04-29 NOTE — Consult Note (Signed)
Pharmacy Antibiotic Note  Gabrielle [name] is a 66 y.o. female admitted on 04/29/2016 with cellulitis.  Pharmacy has been consulted for vancomycin and meropenem dosing. Pt presents with non-purulent cellulitis, elevated WBC, tachycardia, subjective fever/chills. Pt is morbidly obese.  Plan: Pt received 1500mg  of vancomycin in the ED. Will give the next dose in 6 hours for stacked dosing Vancomycin 1250mg  IV every 12 hours.  Goal trough 10-15 mcg/mL.  Will draw trough prior to the 5th dose. Pt is at risk for accumulation due to weight. Monitor renal function closely Meropenem 1g q 8 hours  Weight: (!) 468 lb (212.3 kg)  Temp (24hrs), Avg:97.5 F (36.4 C), Min:97.5 F (36.4 C), Max:97.5 F (36.4 C)   Recent Labs Lab 04/29/16 1149  WBC 16.2*  CREATININE 1.03*    CrCl cannot be calculated (Unknown ideal weight.).    Allergies  Allergen Reactions  . Lasix [Furosemide]     Numbness and tingling of the face   . Ultram [Tramadol] Other (See Comments)    Pt reports, "makes my head feel funny."  . Erythromycin Rash  . Tetracyclines & Related Rash    Antimicrobials this admission: vancomycin 3/23 >>  meropenem 3/23 >>   Dose adjustments this admission:   Microbiology results: 3/23 BCx:    Thank you for allowing pharmacy to be a part of this patient's care.  Ramond Dial, Pharm.D, BCPS Clinical Pharmacist  04/29/2016 1:57 PM

## 2016-04-29 NOTE — ED Notes (Signed)
Spoke with pharmacy tech and informed him that patient would like to personally go over her medication with him.  Verbalized understanding.  Patient made aware he would be in there at his earliest convenience.

## 2016-04-29 NOTE — ED Provider Notes (Signed)
[facility] Emergency Department Provider Note  ____________________________________________   I have reviewed the triage vital signs and the nursing notes.   HISTORY  Chief Complaint Weakness and Cellulitis    HPI Gabrielle [name] is a 66 y.o. female presents today complaining of cellulitis to the right lower extremity. Redness, subjective fevers generalized weakness. She's tried at home to treat this with Augmentin, Keflex and amoxicillin all old prescription of them helped. Patient states that she has had this before. She has chronic lower extremity edema as well which seems to be getting worse. But the cellulitis is her chief complaint.      Past Medical History:  Diagnosis Date  . Arthritis   . Diabetes mellitus without complication ([place])   . Hypertension   . Osteoarthritis     Patient Active Problem List   Diagnosis Date Noted  . Acute on chronic kidney failure ([place]) 10/02/2015  . Diabetes ([place]) 10/01/2015  . SOB (shortness of breath) 10/01/2015  . AKI (acute kidney injury) ([place]) 10/01/2015  . Hyperkalemia 07/21/2015  . Uncontrolled hypertension 07/21/2015    Past Surgical History:  Procedure Laterality Date  . NO PAST SURGERIES    . none      Prior to Admission medications   Medication Sig Start Date End Date Taking? Authorizing Provider  acetaminophen (TYLENOL) 500 MG tablet Take 500 mg by mouth 3 (three) times daily.   Yes Historical Provider, MD  amoxicillin (AMOXIL) 500 MG tablet Take 500 mg by mouth 2 (two) times daily.   Yes Historical Provider, MD  Cholecalciferol (VITAMIN D3) 1000 units CAPS Take 1,000 Units by mouth 3 (three) times a week.    Yes Historical Provider, MD  clobetasol ointment (TEMOVATE) 8.33 % Apply 1 application topically 2 (two) times daily.   Yes Historical Provider, MD  ethacrynic acid (EDECRIN) 25 MG tablet Take 2 tablets (50 mg total) by mouth daily. Patient taking differently: Take 75 mg by mouth 2 (two)  times daily.  07/27/15  Yes Dustin Flock, MD  fluticasone (FLONASE) 50 MCG/ACT nasal spray Place 1 spray into both nostrils as needed.    Yes Historical Provider, MD  hydrALAZINE (APRESOLINE) 25 MG tablet Take 1 tablet (25 mg total) by mouth every 8 (eight) hours. Patient taking differently: Take 2.5-5 mg by mouth every 8 (eight) hours. Take 5mg  in the morning and at bedtime. Take 2.5mg  at 4PM. 07/27/15  Yes Dustin Flock, MD  insulin glargine (LANTUS) 100 UNIT/ML injection Inject 0.5 mLs (50 Units total) into the skin daily. Patient taking differently: Inject 85 Units into the skin daily.  10/04/15  Yes Henreitta Leber, MD  insulin lispro (HUMALOG) 100 UNIT/ML injection Inject into the skin 3 (three) times daily with meals. Per sliding scale 1 units per 10 grams of carbs   Yes Historical Provider, MD  LORazepam (ATIVAN) 0.5 MG tablet Take 1 tablet (0.5 mg total) by mouth every 8 (eight) hours as needed for anxiety. Patient taking differently: Take 0.5 mg by mouth every 12 (twelve) hours.  07/27/15  Yes Dustin Flock, MD  nabumetone (RELAFEN) 500 MG tablet Take 1 tablet by mouth daily.  06/16/15  Yes Historical Provider, MD  nystatin (NYSTATIN) powder Apply topically 2 (two) times daily. To neck   Yes Historical Provider, MD  polyethylene glycol (MIRALAX / GLYCOLAX) packet Take 17 g by mouth daily.   Yes Historical Provider, MD  vitamin B-12 (CYANOCOBALAMIN) 1000 MCG tablet Take 1,000 mcg by mouth 3 (three) times a week.  Yes Historical Provider, MD  metolazone (ZAROXOLYN) 2.5 MG tablet Take 2.5 mg by mouth 2 (two) times a week. Patient takes on Monday and Thursday    Historical Provider, MD    Allergies Lasix [furosemide]; Ultram [tramadol]; Erythromycin; and Tetracyclines & related  Family History  Problem Relation Age of Onset  . Diabetes Mellitus II Father     Social History Social History  Substance Use Topics  . Smoking status: Never Smoker  . Smokeless tobacco: Never Used  .  Alcohol use No    Review of Systems Constitutional: Positive subjective fever/chills Eyes: No visual changes. ENT: No sore throat. No stiff neck no neck pain Cardiovascular: Denies chest pain. Respiratory: Denies shortness of breath. Gastrointestinal:   no vomiting.  No diarrhea.  No constipation. Genitourinary: Negative for dysuria. Musculoskeletal: Positive chronic lower extremity swelling Skin: cellular changes right lower extremity Neurological: Negative for severe headaches, focal weakness or numbness. 10-point ROS otherwise negative.  ____________________________________________   PHYSICAL EXAM:  VITAL SIGNS: ED Triage Vitals  Enc Vitals Group     BP 04/29/16 1200 (!) 107/52     Pulse Rate 04/29/16 1136 (!) 113     Resp 04/29/16 1200 (!) 28     Temp 04/29/16 1136 97.5 F (36.4 C)     Temp Source 04/29/16 1136 Oral     SpO2 04/29/16 1136 97 %     Weight 04/29/16 1138 (!) 468 lb (212.3 kg)     Height --      Head Circumference --      Peak Flow --      Pain Score 04/29/16 1149 10     Pain Loc --      Pain Edu? --      Excl. in [place]? --     Constitutional: Alert and oriented. Well appearing and in no acute distress. Eyes: Conjunctivae are normal. PERRL. EOMI. Head: Atraumatic. Nose: No congestion/rhinnorhea. Mouth/Throat: Mucous membranes are moist.  Oropharynx non-erythematous. Neck: No stridor.   Nontender with no meningismus Cardiovascular: Normal rate, regular rhythm. Grossly normal heart sounds.  Good peripheral circulation. Respiratory: Normal respiratory effort.  No retractions. Lungs CTAB. Abdominal: Soft and nontender. No distention. No guarding no reboundAnd morbid obesity limits exam Back:  There is no focal tenderness or step off.  there is no midline tenderness there are no lesions noted. there is no CVA tenderness Musculoskeletal: No lower extremity tenderness, no upper extremity tenderness. No joint effusions, no DVT signs strong distal pulses  Significant bilateral edema edema difficult to interpret because of morbid obesity Neurologic:  Normal speech and language. No gross focal neurologic deficits are appreciated.  Skin:  Skin is warm, dry and intact. Circumferential bright red warm hot to touch right lower extremity with blanching, goes up past the knee.Marland Kitchen Psychiatric: Mood and affect are normal. Speech and behavior are normal.  ____________________________________________   LABS (all labs ordered are listed, but only abnormal results are displayed)  Labs Reviewed  CBC - Abnormal; Notable for the following:       Result Value   WBC 16.2 (*)    RBC 3.72 (*)    Hemoglobin 11.7 (*)    HCT 34.5 (*)    RDW 14.7 (*)    All other components within normal limits  CULTURE, BLOOD (ROUTINE X 2)  CULTURE, BLOOD (ROUTINE X 2)  BASIC METABOLIC PANEL  URINALYSIS, COMPLETE (UACMP) WITH MICROSCOPIC   ____________________________________________  EKG  I personally interpreted any EKGs ordered by me or triage  Sinus tach most likely rate 110 no acute ST elevation or depression normal axis ____________________________________________  RADIOLOGY  I reviewed any imaging ordered by me or triage that were performed during my shift and, if possible, patient and/or family made aware of any abnormal findings. ____________________________________________   PROCEDURES  Procedure(s) performed: None  Procedures  Critical Care performed: None  ____________________________________________   INITIAL IMPRESSION / ASSESSMENT AND PLAN / ED COURSE  Pertinent labs & imaging results that were available during my care of the patient were reviewed by me and considered in my medical decision making (see chart for details).  Patient with significant saline change elevated white count and feel home outpatient therapy with considerable comorbidities including most especially morbid obesity and poor circulation. Foot is warm and well perfused. 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 She has been on suppressive therapy for this with Keflex in the past. We will give her vancomycin we will obtain cultures and we will admit her to the hospital because she has failed outpatient treatment.    ____________________________________________   FINAL CLINICAL IMPRESSION(S) / ED DIAGNOSES  Final diagnoses:  Weakness  Cellulitis, unspecified cellulitis site      This chart was dictated using voice recognition software.  Despite best efforts to proofread,  errors can occur which can change meaning.      Schuyler Amor, MD 04/29/16 [id number]

## 2016-04-29 NOTE — H&P (Signed)
[facility] [place] NAME: Gabrielle [name]    MR#:  [id number]  DATE OF BIRTH:  [birth year]  DATE OF ADMISSION:  04/29/2016  PRIMARY CARE PHYSICIAN: Alvester Chou, NP   REQUESTING/REFERRING PHYSICIAN: Dr Charlotte Crumb  CHIEF COMPLAINT:   Chief Complaint  Patient presents with  . Weakness  . Cellulitis    HISTORY OF PRESENT ILLNESS:  Gabrielle [name]  is a 66 y.o. female presents with weakness and worsening redness of the lower right extremity despite taking antibiotics that she has at home. She states that she's had problems with swelling again. She states that her medications have not been helping very much. She tried antibiotics cephalexin 500 mg twice a day until finished, then she had some Augmentin and then she had amoxicillin. She took her last dose of amoxicillin this morning. She had a shaking chill and a low-grade fever. Normally she is able to transfer into her motorized wheelchair but she couldn't even do that. She felt her muscles wouldn't work.  PAST MEDICAL HISTORY:   Past Medical History:  Diagnosis Date  . Arthritis   . Chronic venous stasis dermatitis of both lower extremities   . Diabetes mellitus without complication ([place])   . Hypertension   . Morbid obesity ([place])   . Osteoarthritis   . Sleep apnea     PAST SURGICAL HISTORY:   Past Surgical History:  Procedure Laterality Date  . NO PAST SURGERIES    . none      SOCIAL HISTORY:   Social History  Substance Use Topics  . Smoking status: Never Smoker  . Smokeless tobacco: Never Used  . Alcohol use No    FAMILY HISTORY:   Family History  Problem Relation Age of Onset  . Diabetes Mellitus II Father   . CAD Father   . CAD Mother   . Hiatal hernia Mother     DRUG ALLERGIES:   Allergies  Allergen Reactions  . Lasix [Furosemide]     Numbness and tingling of the face   . Ultram [Tramadol] Other (See Comments)    Pt reports, "makes my head feel  funny."  . Erythromycin Rash  . Tetracyclines & Related Rash    REVIEW OF SYSTEMS:  CONSTITUTIONAL: Low-grade fever, shaking chill. Positive for weakness.  EYES: No blurred or double vision. Wears glasses. Occasional blurry vision EARS, NOSE, AND THROAT: No tinnitus or ear pain. No sore throat. Some dysphagia RESPIRATORY: No cough, positive for shortness of breath. No wheezing or hemoptysis.  CARDIOVASCULAR: Occasional chest pain. Positive for orthopnea, edema.  GASTROINTESTINAL: No nausea, vomiting, diarrhea. Positive for abdominal bloating. No blood in bowel movements. GENITOURINARY: No dysuria, hematuria.  ENDOCRINE: No polyuria, nocturia,  HEMATOLOGY: No anemia, easy bruising or bleeding SKIN: No rash or lesion. MUSCULOSKELETAL: Joint pain all over NEUROLOGIC: No tingling, numbness, weakness.  PSYCHIATRY: Positive for anxiety. No depression.   MEDICATIONS AT HOME:   Prior to Admission medications   Medication Sig Start Date End Date Taking? Authorizing Provider  acetaminophen (TYLENOL) 500 MG tablet Take 500 mg by mouth 3 (three) times daily.   Yes Historical Provider, MD  amoxicillin (AMOXIL) 500 MG tablet Take 500 mg by mouth 2 (two) times daily.   Yes Historical Provider, MD  Cholecalciferol (VITAMIN D3) 1000 units CAPS Take 1,000 Units by mouth 3 (three) times a week.    Yes Historical Provider, MD  clobetasol ointment (TEMOVATE) 6.54 % Apply 1 application topically 2 (two) times daily.  Yes Historical Provider, MD  ethacrynic acid (EDECRIN) 25 MG tablet Take 2 tablets (50 mg total) by mouth daily. Patient taking differently: Take 75 mg by mouth 2 (two) times daily.  07/27/15  Yes Dustin Flock, MD  fluticasone (FLONASE) 50 MCG/ACT nasal spray Place 1 spray into both nostrils as needed.    Yes Historical Provider, MD  hydrALAZINE (APRESOLINE) 25 MG tablet Take 1 tablet (25 mg total) by mouth every 8 (eight) hours. Patient taking differently: Take 2.5-5 mg by mouth every 8  (eight) hours. Take 5mg  in the morning and at bedtime. Take 2.5mg  at 4PM. 07/27/15  Yes Dustin Flock, MD  insulin glargine (LANTUS) 100 UNIT/ML injection Inject 0.5 mLs (50 Units total) into the skin daily. Patient taking differently: Inject 85 Units into the skin daily.  10/04/15  Yes Henreitta Leber, MD  insulin lispro (HUMALOG) 100 UNIT/ML injection Inject into the skin 3 (three) times daily with meals. Per sliding scale 1 units per 10 grams of carbs   Yes Historical Provider, MD  LORazepam (ATIVAN) 0.5 MG tablet Take 1 tablet (0.5 mg total) by mouth every 8 (eight) hours as needed for anxiety. Patient taking differently: Take 0.5 mg by mouth every 12 (twelve) hours.  07/27/15  Yes Dustin Flock, MD  nabumetone (RELAFEN) 500 MG tablet Take 1 tablet by mouth daily.  06/16/15  Yes Historical Provider, MD  nystatin (NYSTATIN) powder Apply topically 2 (two) times daily. To neck   Yes Historical Provider, MD  polyethylene glycol (MIRALAX / GLYCOLAX) packet Take 17 g by mouth daily.   Yes Historical Provider, MD  vitamin B-12 (CYANOCOBALAMIN) 1000 MCG tablet Take 1,000 mcg by mouth 3 (three) times a week.    Yes Historical Provider, MD  metolazone (ZAROXOLYN) 2.5 MG tablet Take 2.5 mg by mouth 2 (two) times a week. Patient takes on Monday and Thursday    Historical Provider, MD      VITAL SIGNS:  Blood pressure 119/62, pulse (!) 105, temperature 97.5 F (36.4 C), temperature source Oral, resp. rate 20, weight (!) 212.3 kg (468 lb), SpO2 97 %.  PHYSICAL EXAMINATION:  GENERAL:  66 y.o.-year-old patient lying in the bed with no acute distress.  EYES: Pupils equal, round, reactive to light and accommodation. No scleral icterus. Extraocular muscles intact.  HEENT: Head atraumatic, normocephalic. Oropharynx and nasopharynx clear.  NECK:  Supple, no jugular venous distention. No thyroid enlargement, no tenderness.  LUNGS: Decreased breath sounds bilaterally, no wheezing, rales,rhonchi or crepitation. No  use of accessory muscles of respiration.  CARDIOVASCULAR: S1, S2 normal. No murmurs, rubs, or gallops.  ABDOMEN: Soft, nontender, nondistended. Bowel sounds present. No organomegaly or mass.  EXTREMITIES: 4+ edema, no cyanosis, or clubbing.  NEUROLOGIC: Cranial nerves II through XII are intact. Sensation intact. Gait not checked.  PSYCHIATRIC: The patient is alert and oriented x 3.  SKIN: Chronic left lower extremity skin discoloration. Right leg erythema which is circumferential. Also seeing some redness in the right thigh with some blistering there  LABORATORY PANEL:   CBC  Recent Labs Lab 04/29/16 1149  WBC 16.2*  HGB 11.7*  HCT 34.5*  PLT 188   ------------------------------------------------------------------------------------------------------------------  Chemistries   Recent Labs Lab 04/29/16 1149  NA 136  K 3.8  CL 89*  CO2 36*  GLUCOSE 215*  BUN 37*  CREATININE 1.03*  CALCIUM 9.4   ------------------------------------------------------------------------------------------------------------------   EKG:   EKG read as atrial flutter but looks more like sinus arrhythmia to me.  IMPRESSION AND PLAN:  1.  Clinical sepsis with Cellulitis of the right lower extremity which Failed outpatient treatment, leukocytosis and tachycardia. Start on vancomycin and meropenem. Blood cultures ordered. 2.  Anasarca. IV ethacrynic acid twice a day and Foley catheter placement only short-term. 3. Morbid obesity and sleep apnea. Unable to tolerate CPAP. Weight loss needed. 4. Type 2 diabetes mellitus on insulin. We'll also add sliding scale. 5. Chronic respiratory failure on oxygen 2 L. Continue to wear 2 L of oxygen 6. Chronic venous stasis bilateral lower extremities 7. Arthritis 8. Anxiety on Ativan  All the records are reviewed and case discussed with ED provider. Management plans discussed with the patient, family and they are in agreement.  CODE STATUS: Full  code  TOTAL TIME TAKING CARE OF THIS PATIENT: 50 minutes.    Loletha Grayer M.D on 04/29/2016 at 2:05 PM  Between 7am to 6pm - Pager - [phone]  After 6pm call admission pager [phone]  [facility] Office  [phone]  CC: Primary care physician; Alvester Chou, NP

## 2016-04-29 NOTE — ED Notes (Signed)
Admitting MD at bedside.

## 2016-04-29 NOTE — ED Notes (Signed)
ED Provider at bedside. 

## 2016-04-29 NOTE — Progress Notes (Signed)
PT Cancellation Note  Patient Details Name: Gabrielle [name] MRN: [id number] DOB: November 23, 1950   Cancelled Treatment:    Reason Eval/Treat Not Completed: Patient declined, no reason specified  Pt reports she is too weak, tired and too sensitive in her R LE to try doing any PT today.  She reports she is eager to participate, but just not today.  Kreg Shropshire, DPT 04/29/2016, 4:29 PM

## 2016-04-29 NOTE — Progress Notes (Signed)
Anticoagulation monitoring(Lovenox):  66 yo female ordered Lovenox 40 mg Q24h  Filed Weights   04/29/16 1138 04/29/16 1537  Weight: (!) 468 lb (212.3 kg) (!) 468 lb (212.3 kg)   BMI 75.7   Lab Results  Component Value Date   CREATININE 1.03 (H) 04/29/2016   CREATININE 1.22 (H) 10/04/2015   CREATININE 1.29 (H) 10/03/2015   Estimated Creatinine Clearance: 106.3 mL/min (A) (by C-G formula based on SCr of 1.03 mg/dL (H)). Hemoglobin & Hematocrit     Component Value Date/Time   HGB 11.7 (L) 04/29/2016 1149   HCT 34.5 (L) 04/29/2016 1149     Per Protocol for Patient with estCrcl > 30 ml/min and BMI > 40, will transition to Lovenox 40 mg Q12h.

## 2016-04-30 LAB — BLOOD CULTURE ID PANEL (REFLEXED)
Acinetobacter baumannii: NOT DETECTED
CANDIDA KRUSEI: NOT DETECTED
CANDIDA PARAPSILOSIS: NOT DETECTED
CANDIDA TROPICALIS: NOT DETECTED
CARBAPENEM RESISTANCE: NOT DETECTED
Candida albicans: NOT DETECTED
Candida glabrata: NOT DETECTED
ENTEROBACTERIACEAE SPECIES: NOT DETECTED
Enterobacter cloacae complex: NOT DETECTED
Enterococcus species: NOT DETECTED
Escherichia coli: NOT DETECTED
HAEMOPHILUS INFLUENZAE: NOT DETECTED
KLEBSIELLA OXYTOCA: NOT DETECTED
KLEBSIELLA PNEUMONIAE: NOT DETECTED
Listeria monocytogenes: NOT DETECTED
Neisseria meningitidis: NOT DETECTED
PROTEUS SPECIES: NOT DETECTED
PSEUDOMONAS AERUGINOSA: DETECTED — AB
SERRATIA MARCESCENS: NOT DETECTED
STAPHYLOCOCCUS AUREUS BCID: NOT DETECTED
STAPHYLOCOCCUS SPECIES: NOT DETECTED
STREPTOCOCCUS SPECIES: NOT DETECTED
Streptococcus agalactiae: NOT DETECTED
Streptococcus pneumoniae: NOT DETECTED
Streptococcus pyogenes: NOT DETECTED

## 2016-04-30 LAB — BASIC METABOLIC PANEL
ANION GAP: 7 (ref 5–15)
BUN: 33 mg/dL — ABNORMAL HIGH (ref 6–20)
CALCIUM: 8.7 mg/dL — AB (ref 8.9–10.3)
CO2: 39 mmol/L — ABNORMAL HIGH (ref 22–32)
CREATININE: 1.02 mg/dL — AB (ref 0.44–1.00)
Chloride: 91 mmol/L — ABNORMAL LOW (ref 101–111)
GFR, EST NON AFRICAN AMERICAN: 57 mL/min — AB (ref >60)
GLUCOSE: 158 mg/dL — AB (ref 65–99)
Potassium: 3.9 mmol/L (ref 3.5–5.1)
Sodium: 137 mmol/L (ref 135–145)

## 2016-04-30 LAB — GLUCOSE, CAPILLARY
GLUCOSE-CAPILLARY: 186 mg/dL — AB (ref 65–99)
GLUCOSE-CAPILLARY: 212 mg/dL — AB (ref 65–99)
Glucose-Capillary: 167 mg/dL — ABNORMAL HIGH (ref 65–99)
Glucose-Capillary: 197 mg/dL — ABNORMAL HIGH (ref 65–99)

## 2016-04-30 LAB — CBC
HCT: 31 % — ABNORMAL LOW (ref 35.0–47.0)
HEMOGLOBIN: 10.5 g/dL — AB (ref 12.0–16.0)
MCH: 31.7 pg (ref 26.0–34.0)
MCHC: 33.9 g/dL (ref 32.0–36.0)
MCV: 93.3 fL (ref 80.0–100.0)
PLATELETS: 152 10*3/uL (ref 150–440)
RBC: 3.32 MIL/uL — ABNORMAL LOW (ref 3.80–5.20)
RDW: 14.5 % (ref 11.5–14.5)
WBC: 11.1 10*3/uL — ABNORMAL HIGH (ref 3.6–11.0)

## 2016-04-30 MED ORDER — ETHACRYNIC ACID 25 MG PO TABS
50.0000 mg | ORAL_TABLET | Freq: Two times a day (BID) | ORAL | Status: DC
  Administered 2016-04-30 (×2): 50 mg via ORAL
  Filled 2016-04-30 (×3): qty 2

## 2016-04-30 MED ORDER — CEFEPIME-DEXTROSE 2 GM/50ML IV SOLR
2.0000 g | Freq: Three times a day (TID) | INTRAVENOUS | Status: DC
  Filled 2016-04-30 (×2): qty 50

## 2016-04-30 MED ORDER — CEFEPIME HCL 2 G IJ SOLR
2.0000 g | Freq: Three times a day (TID) | INTRAMUSCULAR | Status: DC
  Administered 2016-04-30 – 2016-05-04 (×11): 2 g via INTRAVENOUS
  Filled 2016-04-30 (×16): qty 2

## 2016-04-30 NOTE — Progress Notes (Signed)
Received a call from operator stating that pt requested to have her bed fixed. Foot part of the bed not working, but worked fine earlier. Writer requested for a work order via phone. Will call and follow up with maintenance.

## 2016-04-30 NOTE — Progress Notes (Signed)
Physical Therapy Evaluation Patient Details Name: Gabrielle [name] MRN: [id number] DOB: 11-15-1950 Today's Date: 04/30/2016   History of Present Illness  Patient is a 66 y.o. female admitted on 23 March with R LE cellulitis. 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  Clinical Impression  Patient is a pleasant female admitted for above listed reasons. Patient previously lived alone in handicap accessible home, utilizing sit to stand chair and power wheelchair for household mobility. Has a nurse who assists 3x/week but does not leave home. Upon evaluation, patient demonstrates global weakness in both UE and LEs and was unable to bend knees/lift LEs. Patient demonstrates need for +2 total assistance for rolling in bed and total assistance for cleaning after BM. Required +4 assistance to slide to [facility] and was unable to assist with reaching for bed rails and pulling. Because patient is not at baseline level of function and does not have the necessary care level needed at home, it is believed that patient will require further PT to improve functional strength and mobility with f/u care at [facility] upon discharge when medically ready.    Follow Up Recommendations SNF    Equipment Recommendations  None recommended by PT    Recommendations for Other Services       Precautions / Restrictions Precautions Precautions: Fall Restrictions Weight Bearing Restrictions: No      Mobility  Bed Mobility Overal bed mobility: Needs Assistance Bed Mobility: Rolling Rolling: Total assist;+2 for physical assistance         General bed mobility comments: Patient required total +2 assistance to roll to R and for cleaning after BM. Required +4 assistance to slide to [facility].  Transfers                    Ambulation/Gait                Stairs            Wheelchair Mobility    Modified Rankin (Stroke Patients Only)       Balance Overall balance  assessment: Needs assistance Sitting-balance support: Bilateral upper extremity supported (Unable to sit upright in bed)                                         Pertinent Vitals/Pain Pain Assessment: Faces Faces Pain Scale: Hurts even more Pain Location: R LE Pain Descriptors / Indicators: Aching Pain Intervention(s): Limited activity within patient's tolerance;Monitored during session;Repositioned    Home Living Family/patient expects to be discharged to:: Private residence Living Arrangements: Alone Available Help at Discharge: Family;Available PRN/intermittently;Personal care attendant Type of Home: House Home Access: Ramped entrance     Home Layout: One level Home Equipment: Walker - standard;Wheelchair - power;Toilet riser;Grab bars - toilet;Grab bars - tub/shower;Other (comment) (Sit to stand assist)      Prior Function Level of Independence: Needs assistance   Gait / Transfers Assistance Needed: ModI with STS assist chair and transfer to power w/c  ADL's / Homemaking Assistance Needed: Performed by nurse MWF        Hand Dominance        Extremity/Trunk Assessment   Upper Extremity Assessment Upper Extremity Assessment: RUE deficits/detail;LUE deficits/detail;Generalized weakness RUE Deficits / Details: ~120 deg. flexion; 3+ to 4-/5 globally LUE Deficits / Details: ~110 deg. flexion; 3+ to 4-/5 globally    Lower Extremity Assessment Lower  Extremity Assessment: Generalized weakness;RLE deficits/detail;LLE deficits/detail RLE Deficits / Details: Cellulitis, decreased A/PROM knee flexion; unable to lift LE, grossly 3-/5 RLE: Unable to fully assess due to pain LLE Deficits / Details: Decreased A/PROM knee flexion; unable to lift LE, grossly 3-/5 LLE: Unable to fully assess due to pain       Communication   Communication: No difficulties  Cognition Arousal/Alertness: Awake/alert Behavior During Therapy: WFL for tasks  assessed/performed Overall Cognitive Status: Within Functional Limits for tasks assessed                                        General Comments      Exercises     Assessment/Plan    PT Assessment Patient needs continued PT services  PT Problem List Decreased strength;Decreased range of motion;Decreased activity tolerance;Decreased balance;Decreased mobility;Decreased safety awareness;Obesity;Pain;Decreased skin integrity       PT Treatment Interventions DME instruction;Functional mobility training;Therapeutic activities;Therapeutic exercise;Balance training;Patient/family education;Wheelchair mobility training    PT Goals (Current goals can be found in the Care Plan section)  Acute Rehab PT Goals Patient Stated Goal: "To go home" PT Goal Formulation: With patient Time For Goal Achievement: 05/14/16 Potential to Achieve Goals: Fair    Frequency Min 2X/week   Barriers to discharge Inaccessible home environment;Decreased caregiver support      Co-evaluation               End of Session Equipment Utilized During Treatment: Oxygen Activity Tolerance: Patient tolerated treatment well;Patient limited by lethargy;Patient limited by fatigue;Patient limited by pain Patient left: in bed;with call bell/phone within reach   PT Visit Diagnosis: Muscle weakness (generalized) (M62.81);Pain Pain - Right/Left: Right Pain - part of body: Leg    Time: 0786-7544 PT Time Calculation (min) (ACUTE ONLY): 54 min   Charges:   PT Evaluation $PT Eval High Complexity: 1 Procedure PT Treatments $Therapeutic Activity: 38-52 mins   PT G Codes:          Dorice Lamas, PT, DPT 04/30/2016, 10:53 AM

## 2016-04-30 NOTE — Progress Notes (Signed)
PHARMACIST - PHYSICIAN COMMUNICATION  CONCERNING: IV to Oral Route Change Policy  RECOMMENDATION: This patient is receiving Ethacrynic Acid by the intravenous route.  Based on criteria approved by the Pharmacy and Therapeutics Committee, the intravenous medication(s) is/are being converted to the equivalent oral dose form(s).   DESCRIPTION: These criteria include:  The patient is eating (either orally or via tube) and/or has been taking other orally administered medications for a least 24 hours  The patient has no evidence of active gastrointestinal bleeding or impaired GI absorption (gastrectomy, short bowel, patient on TNA or NPO).

## 2016-04-30 NOTE — Consult Note (Signed)
Pharmacy Antibiotic Note  Gabrielle [name] is a 66 y.o. female admitted on 04/29/2016 with cellulitis, now with positive BCID results showing Pseudomonas.  Pharmacy not consulted for cefepime dosing   Plan: Will start the patient on cefepime 2gm IV every 8 hours.   Height: 5\' 6"  (167.6 cm) Weight: (!) 468 lb (212.3 kg) IBW/kg (Calculated) : 59.3  Temp (24hrs), Avg:98.2 F (36.8 C), Min:97.5 F (36.4 C), Max:98.7 F (37.1 C)   Recent Labs Lab 04/29/16 1149 04/30/16 0351  WBC 16.2* 11.1*  CREATININE 1.03* 1.02*    Estimated Creatinine Clearance: 107.4 mL/min (A) (by C-G formula based on SCr of 1.02 mg/dL (H)).    Allergies  Allergen Reactions  . Lasix [Furosemide]     Numbness and tingling of the face   . Ultram [Tramadol] Other (See Comments)    Pt reports, "makes my head feel funny."  . Erythromycin Rash  . Tetracyclines & Related Rash    Antimicrobials this admission: vancomycin 3/23 >> 3/24 meropenem 3/23 >> 3/24 Cefepime 3/24>>  Dose adjustments this admission:  Microbiology results: 3/23 BCID: Pseudomonas  3/23 BCx:   Thank you for allowing pharmacy to be a part of this patient's care.  Pernell Dupre, PharmD, BCPS Clinical Pharmacist 04/30/2016 10:08 AM

## 2016-04-30 NOTE — Progress Notes (Signed)
[place] at [place] NAME: Gabrielle [name]    MR#:  [id number]  DATE OF BIRTH:  1951-01-20  SUBJECTIVE:  Patient here with cellulitis continues to have pain and lower extremity no fevers overnight  REVIEW OF SYSTEMS:    Review of Systems  Constitutional: Negative.  Negative for chills, fever and malaise/fatigue.  HENT: Negative.  Negative for ear discharge, ear pain, hearing loss, nosebleeds and sore throat.   Eyes: Negative.  Negative for blurred vision and pain.  Respiratory: Negative.  Negative for cough, hemoptysis, shortness of breath and wheezing.   Cardiovascular: Positive for leg swelling. Negative for chest pain and palpitations.  Gastrointestinal: Negative.  Negative for abdominal pain, blood in stool, diarrhea, nausea and vomiting.  Genitourinary: Negative.  Negative for dysuria.  Musculoskeletal: Negative.  Negative for back pain.  Skin:       cellulitis  Neurological: Negative for dizziness, tremors, speech change, focal weakness, seizures and headaches.  Endo/Heme/Allergies: Negative.  Does not bruise/bleed easily.  Psychiatric/Behavioral: Negative.  Negative for depression, hallucinations and suicidal ideas.    Tolerating Diet: yes      DRUG ALLERGIES:   Allergies  Allergen Reactions  . Lasix [Furosemide]     Numbness and tingling of the face   . Ultram [Tramadol] Other (See Comments)    Pt reports, "makes my head feel funny."  . Erythromycin Rash  . Tetracyclines & Related Rash    VITALS:  Blood pressure (!) 119/54, pulse 87, temperature 98.5 F (36.9 C), temperature source Oral, resp. rate 18, height 5\' 6"  (1.676 m), weight (!) 212.3 kg (468 lb), SpO2 96 %.  PHYSICAL EXAMINATION:   Physical Exam  Constitutional: She is oriented to person, place, and time. No distress.  Morbidly obese  HENT:  Head: Normocephalic.  Eyes: No scleral icterus.  Neck: Normal range of motion. Neck supple. No JVD present. No  tracheal deviation present.  Cardiovascular: Normal rate, regular rhythm and normal heart sounds.  Exam reveals no gallop and no friction rub.   No murmur heard. Pulmonary/Chest: Effort normal and breath sounds normal. No respiratory distress. She has no wheezes. She has no rales. She exhibits no tenderness.  Abdominal: Soft. Bowel sounds are normal. She exhibits no distension and no mass. There is no tenderness. There is no rebound and no guarding.  Musculoskeletal: She exhibits edema and tenderness.  Neurological: She is alert and oriented to person, place, and time.  Skin: Skin is warm. No rash noted. No erythema.  Large area of cellulitis right lower extremity with marked erythema, edema and tenderness   Psychiatric: Affect and judgment normal.      LABORATORY PANEL:   CBC  Recent Labs Lab 04/30/16 0351  WBC 11.1*  HGB 10.5*  HCT 31.0*  PLT 152   ------------------------------------------------------------------------------------------------------------------  Chemistries   Recent Labs Lab 04/30/16 0351  NA 137  K 3.9  CL 91*  CO2 39*  GLUCOSE 158*  BUN 33*  CREATININE 1.02*  CALCIUM 8.7*   ------------------------------------------------------------------------------------------------------------------  Cardiac Enzymes No results for input(s): TROPONINI in the last 168 hours. ------------------------------------------------------------------------------------------------------------------  RADIOLOGY:  No results found.   ASSESSMENT AND PLAN:   66 year old morbidly obese female with a history of sleep apnea and diabetes who presents with sepsis  1. Sepsis due to right lower extremity cellulitis and Pseudomonas bacteremia: Continue cefepime  2. Right lower extremity cellulitis: Continue cefepime and elevate leg Continue Zaroxolyn 3. Chronic lower extremity edema from venous insufficiency: Continue Edecrin  4. Sleep apnea with morbid obesity: Order  BiPAP at night Weight loss is recommended  5. Diabetes: Continue sliding scale insulin, ADA diet and Lantus  6. Anxiety: Continue Ativan  7. Chronic respiratory failure on 2 L oxygen    Physical therapy consultation for disposition    Management plans discussed with the patient and she is in agreement.  CODE STATUS: full  TOTAL TIME TAKING CARE OF THIS PATIENT: 30 minutes.     POSSIBLE D/C 2 days, DEPENDING ON CLINICAL CONDITION.   Gabrielle [name] M.D on 04/30/2016 at 10:09 AM  Between 7am to 6pm - Pager - [phone] After 6pm go to [email or web address] - password [email or web address] [facility]  Office  [phone]  CC: Primary care physician; Alvester Chou, NP  Note: This dictation was prepared with Dragon dictation along with smaller phrase technology. Any transcriptional errors that result from this process are unintentional.

## 2016-04-30 NOTE — Progress Notes (Signed)
[employer] bariatric bed came to fix her bed. The lower portion of the bed, the foot part did not work with the remot .  [employer] did fix the mattress pad, the foot part on her bed could not be fixed, the only way was to move to another bed that [employer] had brought in. The patient refused to switch to another bed.

## 2016-05-01 ENCOUNTER — Inpatient Hospital Stay: Payer: Medicare Other

## 2016-05-01 LAB — BASIC METABOLIC PANEL
ANION GAP: 6 (ref 5–15)
BUN: 30 mg/dL — AB (ref 6–20)
CALCIUM: 8.9 mg/dL (ref 8.9–10.3)
CO2: 41 mmol/L — ABNORMAL HIGH (ref 22–32)
Chloride: 92 mmol/L — ABNORMAL LOW (ref 101–111)
Creatinine, Ser: 1.1 mg/dL — ABNORMAL HIGH (ref 0.44–1.00)
GFR calc Af Amer: >60 mL/min (ref >60)
GFR calc non Af Amer: 52 mL/min — ABNORMAL LOW (ref >60)
Glucose, Bld: 202 mg/dL — ABNORMAL HIGH (ref 65–99)
Potassium: 4 mmol/L (ref 3.5–5.1)
SODIUM: 139 mmol/L (ref 135–145)

## 2016-05-01 LAB — GLUCOSE, CAPILLARY
GLUCOSE-CAPILLARY: 195 mg/dL — AB (ref 65–99)
Glucose-Capillary: 179 mg/dL — ABNORMAL HIGH (ref 65–99)
Glucose-Capillary: 200 mg/dL — ABNORMAL HIGH (ref 65–99)
Glucose-Capillary: 235 mg/dL — ABNORMAL HIGH (ref 65–99)

## 2016-05-01 MED ORDER — ETHACRYNIC ACID 25 MG PO TABS
75.0000 mg | ORAL_TABLET | Freq: Two times a day (BID) | ORAL | Status: DC
  Administered 2016-05-01 – 2016-05-04 (×7): 75 mg via ORAL
  Filled 2016-05-01 (×9): qty 3

## 2016-05-01 NOTE — Care Management Note (Addendum)
Case Management Note  Patient Details  Name: Gabrielle [name] MRN: [id number] Date of Birth: [birth year]  Subjective/Objective:     Homebound Gabrielle [name], who lives alone in a handicap equiped residence,  Is requesting home with home health services IF she can stand and pivet independently. Otherwise, she will consider going to Rehab.( does not want WOM). She currently has 2L N/C and a Bipap machine supplied by [facility]. She denies having any current home health services. Her home equipment includes a non-rolling walker, a lift-recliner, a motorized wheelchair, and an electric bed.  Gabrielle [name] is requesting the following consults during this hospitalization: Dermatology, Opthamology, Pulmonolgy. Gabrielle [name] was encouraged to discuss this request this request with her Hospitalist.  A sticky note was left for her physicians.               Action/Plan:   Expected Discharge Date:  05/02/16               Expected Discharge Plan:     In-House Referral:     Discharge planning Services     Post Acute Care Choice:    Choice offered to:     DME Arranged:    DME Agency:     HH Arranged:    HH Agency:     Status of Service:     If discussed at [employer], dates discussed:    Additional Comments:  Gaile Allmon A, RN 05/01/2016, 3:43 PM

## 2016-05-01 NOTE — Progress Notes (Signed)
[place] at [place] NAME: Gabrielle [name]    MR#:  [id number]  DATE OF BIRTH:  [birth year]  SUBJECTIVE:  Patient does not want to go to SNF She want reeval with Pt RLE cellulitis improving BCID + Pseudomonas  REVIEW OF SYSTEMS:    Review of Systems  Constitutional: Negative.  Negative for chills, fever and malaise/fatigue.  HENT: Negative.  Negative for ear discharge, ear pain, hearing loss, nosebleeds and sore throat.   Eyes: Negative.  Negative for blurred vision and pain.  Respiratory: Negative.  Negative for cough, hemoptysis, shortness of breath and wheezing.   Cardiovascular: Positive for leg swelling. Negative for chest pain and palpitations.  Gastrointestinal: Negative.  Negative for abdominal pain, blood in stool, diarrhea, nausea and vomiting.  Genitourinary: Negative.  Negative for dysuria.  Musculoskeletal: Negative.  Negative for back pain.  Skin:       cellulitis  Neurological: Negative for dizziness, tremors, speech change, focal weakness, seizures and headaches.  Endo/Heme/Allergies: Negative.  Does not bruise/bleed easily.  Psychiatric/Behavioral: Negative.  Negative for depression, hallucinations and suicidal ideas.    Tolerating Diet: yes      DRUG ALLERGIES:   Allergies  Allergen Reactions  . Lasix [Furosemide]     Numbness and tingling of the face   . Ultram [Tramadol] Other (See Comments)    Pt reports, "makes my head feel funny."  . Erythromycin Rash  . Tetracyclines & Related Rash    VITALS:  Blood pressure (!) 121/48, pulse 75, temperature 97.7 F (36.5 C), temperature source Oral, resp. rate 19, height 5\' 6"  (1.676 m), weight (!) 213.6 kg (471 lb), SpO2 99 %.  PHYSICAL EXAMINATION:   Physical Exam  Constitutional: She is oriented to person, place, and time. No distress.  Morbidly obese  HENT:  Head: Normocephalic.  Eyes: No scleral icterus.  Neck: Normal range of motion. Neck supple. No JVD  present. No tracheal deviation present.  Cardiovascular: Normal rate, regular rhythm and normal heart sounds.  Exam reveals no gallop and no friction rub.   No murmur heard. Pulmonary/Chest: Effort normal and breath sounds normal. No respiratory distress. She has no wheezes. She has no rales. She exhibits no tenderness.  Abdominal: Soft. Bowel sounds are normal. She exhibits no distension and no mass. There is no tenderness. There is no rebound and no guarding.  Musculoskeletal: She exhibits edema and tenderness.  Neurological: She is alert and oriented to person, place, and time.  Skin: Skin is warm. No rash noted. No erythema.  Large area of cellulitis right lower extremity with marked erythema, edema and tenderness   Psychiatric: Affect and judgment normal.      LABORATORY PANEL:   CBC  Recent Labs Lab 04/30/16 0351  WBC 11.1*  HGB 10.5*  HCT 31.0*  PLT 152   ------------------------------------------------------------------------------------------------------------------  Chemistries   Recent Labs Lab 05/01/16 0422  NA 139  K 4.0  CL 92*  CO2 41*  GLUCOSE 202*  BUN 30*  CREATININE 1.10*  CALCIUM 8.9   ------------------------------------------------------------------------------------------------------------------  Cardiac Enzymes No results for input(s): TROPONINI in the last 168 hours. ------------------------------------------------------------------------------------------------------------------  RADIOLOGY:  No results found.   ASSESSMENT AND PLAN:   66 year old morbidly obese female with a history of sleep apnea and diabetes who presents with sepsis  1. Sepsis due to right lower extremity cellulitis and Pseudomonas bacteremia: Continue cefepime and f/u final blood cultures with sensitivities  2. Right lower extremity cellulitis: Continue cefepime and elevate leg  Continue Zaroxolyn Order lower extremity Doppler to evaluate for DVT   3. Chronic  lower extremity edema from venous insufficiency: Continue Edecrin  Watch Co2 Continue Foley for now  4. Sleep apnea with morbid obesity: BiPAP at night Weight loss is recommended   5. Diabetes: Continue sliding scale insulin, ADA diet and Lantus  6. Anxiety: Continue Ativan  7. Chronic respiratory failure on 2 L oxygen    Physical therapy consultation for disposition    Management plans discussed with the patient and she is in agreement.  CODE STATUS: full  TOTAL TIME TAKING CARE OF THIS PATIENT: 24 minutes.     POSSIBLE D/C 1-2 days to SNF , DEPENDING ON CLINICAL CONDITION.   Camiya Vinal M.D on 05/01/2016 at 9:26 AM  Between 7am to 6pm - Pager - [phone] After 6pm go to [email or web address] - password [email or web address] [facility]  Office  [phone]  CC: Primary care physician; Alvester Chou, NP  Note: This dictation was prepared with Dragon dictation along with smaller phrase technology. Any transcriptional errors that result from this process are unintentional.

## 2016-05-01 NOTE — NC FL2 (Signed)
[place] LEVEL OF CARE SCREENING TOOL     IDENTIFICATION  Patient Name: Gabrielle [name] Birthdate: Jan 22, 1951 Sex: female Admission Date (Current Location): 04/29/2016  [place] and [place] Number:  Engineering geologist and Address:  [facility], [address], [place], [place] [address]      Provider Number: [id number]  Attending Physician Name and Address:  Bettey Costa, MD  Relative Name and Phone Number:       Current Level of Care: Hospital Recommended Level of Care: [place] Prior Approval Number:    Date Approved/Denied:   PASRR Number: [id number]  Discharge Plan: SNF    Current Diagnoses: Patient Active Problem List   Diagnosis Date Noted  . Cellulitis 04/29/2016  . Acute on chronic kidney failure ([place]) 10/02/2015  . Diabetes ([place]) 10/01/2015  . SOB (shortness of breath) 10/01/2015  . AKI (acute kidney injury) ([place]) 10/01/2015  . Hyperkalemia 07/21/2015  . Uncontrolled hypertension 07/21/2015    Orientation RESPIRATION BLADDER Height & Weight     Self, Time, Situation, Place  Normal Continent Weight: (!) 471 lb (213.6 kg) Height:  5\' 6"  (167.6 cm)  BEHAVIORAL SYMPTOMS/MOOD NEUROLOGICAL BOWEL NUTRITION STATUS      Continent Diet (ADA Diabetes Diet)  AMBULATORY STATUS COMMUNICATION OF NEEDS Skin   Extensive Assist Verbally Skin abrasions (Cellulitis)                       Personal Care Assistance Level of Assistance  Bathing, Feeding, Dressing Bathing Assistance: Limited assistance Feeding assistance: Independent Dressing Assistance: Limited assistance     Functional Limitations Info             SPECIAL CARE FACTORS FREQUENCY  PT (By licensed PT), OT (By licensed OT)     PT Frequency: Up to 5X per day, 5 days per week OT Frequency: Up to 5X per day, 5 days per week            Contractures Contractures Info: Present    Additional Factors Info  Allergies, Insulin Sliding  Scale, Psychotropic   Allergies Info: Lasix Furosemide, Ultram Tramadol, Erythromycin, Tetracyclines & Related Psychotropic Info: Ativan for anxiety PRN Insulin Sliding Scale Info: 85 units of Lantus (daily), 3 units of Novolog, TID       Current Medications (05/01/2016):  This is the current hospital active medication list Current Facility-Administered Medications  Medication Dose Route Frequency Provider Last Rate Last Dose  . acetaminophen (TYLENOL) tablet 650 mg  650 mg Oral Q6H PRN Loletha Grayer, MD   650 mg at 05/01/16 0333  . ceFEPIme (MAXIPIME) 2 g in dextrose 5 % 50 mL IVPB  2 g Intravenous Q8H Sheema M Hallaji, RPH   2 g at 05/01/16 0528  . cholecalciferol (VITAMIN D) tablet 1,000 Units  1,000 Units Oral Once per day on Mon Wed Fri Loletha Grayer, MD      . enoxaparin (LOVENOX) injection 40 mg  40 mg Subcutaneous Q12H Loletha Grayer, MD   40 mg at 04/30/16 2059  . ethacrynic acid (EDECRIN) tablet 75 mg  75 mg Oral BID Bettey Costa, MD   75 mg at 05/01/16 1013  . fluticasone (FLONASE) 50 MCG/ACT nasal spray 1 spray  1 spray Each Nare Daily Loletha Grayer, MD   1 spray at 05/01/16 1014  . insulin aspart (novoLOG) injection 0-5 Units  0-5 Units Subcutaneous QHS Loletha Grayer, MD   2 Units at 04/30/16 2117  . insulin aspart (novoLOG)  injection 0-9 Units  0-9 Units Subcutaneous TID WC Loletha Grayer, MD   2 Units at 05/01/16 1138  . insulin aspart (novoLOG) injection 3 Units  3 Units Subcutaneous TID WC Loletha Grayer, MD   3 Units at 05/01/16 1138  . insulin glargine (LANTUS) injection 85 Units  85 Units Subcutaneous Daily Loletha Grayer, MD   85 Units at 05/01/16 1015  . LORazepam (ATIVAN) tablet 0.5 mg  0.5 mg Oral Q8H PRN Loletha Grayer, MD      . Derrill Memo ON 05/02/2016] metolazone (ZAROXOLYN) tablet 2.5 mg  2.5 mg Oral Once per day on Mon Thu Loletha Grayer, MD      . nabumetone (RELAFEN) tablet 500 mg  500 mg Oral Daily Loletha Grayer, MD   500 mg at 05/01/16 1015  . nystatin  (MYCOSTATIN/NYSTOP) topical powder   Topical BID Loletha Grayer, MD      . polyethylene glycol (MIRALAX / GLYCOLAX) packet 17 g  17 g Oral Daily Loletha Grayer, MD   17 g at 04/30/16 1131  . vitamin B-12 (CYANOCOBALAMIN) tablet 1,000 mcg  1,000 mcg Oral Once per day on Mon Wed Fri Loletha Grayer, MD         Discharge Medications: Please see discharge summary for a list of discharge medications.  Relevant Imaging Results:  Relevant Lab Results:   Additional Information SS# [id number]  Zettie Pho, LCSW

## 2016-05-01 NOTE — Clinical Social Work Note (Signed)
Clinical Social Work Assessment  Patient Details  Name: Gabrielle [name] MRN: [id number] Date of Birth: 06/03/1950  Date of referral:  05/01/16               Reason for consult:  Facility Placement                Permission sought to share information with:  Chartered certified accountant granted to share information::  Yes, Verbal Permission Granted  Name::        Agency::     Relationship::     Contact Information:     Housing/Transportation Living arrangements for the past 2 months:  Single Family Home Source of Information:  Patient, Other (Comment Required) (Sibling) Patient Interpreter Needed:  None Criminal Activity/Legal Involvement Pertinent to Current Situation/Hospitalization:  No - Comment as needed Significant Relationships:  Adult Children, Siblings Lives with:  Self Do you feel safe going back to the place where you live?  Yes Need for family participation in patient care:  No (Coment)  Care giving concerns:  PT recommendation for STR   Social Worker assessment / plan:  CSW met with the patient and her sister at bedside to discuss dc planning. The patient gave verbal permission to conduct bed search; however, she has said that she would prefer HHPT rather than facility based as she feels that her one session with PT did not give her an opportunity to show her abilities ("They only focused on my bowel movements and did not even have me sit on the side of the bed").  The patient also wants to ensure that any facility that takes her has a bariatric bed and appropriate dietary access as she has in the past had an issue with both of these situations at [facility]. The patient has said that she would also prefer a newer facility. CSW advised the patient of the facilities available.    Employment status:  Retired Nurse, adult PT Recommendations:  [place] / Referral to community resources:  [place]  Patient/Family's Response to care:  The patient and her sister thanked CSW for assistance.  Patient/Family's Understanding of and Emotional Response to Diagnosis, Current Treatment, and Prognosis:  The patient is quite aware of her needs and is able to make firm decisions.  Emotional Assessment Appearance:  Appears stated age Attitude/Demeanor/Rapport:  Apprehensive, Other (Pleasant) Affect (typically observed):  Appropriate, Pleasant Orientation:  Oriented to Self, Oriented to Place, Oriented to  Time, Oriented to Situation Alcohol / Substance use:  Never Used Psych involvement (Current and /or in the community):  No (Comment)  Discharge Needs  Concerns to be addressed:  Care Coordination, Discharge Planning Concerns Readmission within the last 30 days:  No Current discharge risk:  None Barriers to Discharge:  Continued Medical Work up   [employer], LCSW 05/01/2016, 12:24 PM

## 2016-05-02 LAB — BASIC METABOLIC PANEL
ANION GAP: 8 (ref 5–15)
BUN: 29 mg/dL — ABNORMAL HIGH (ref 6–20)
CALCIUM: 9.1 mg/dL (ref 8.9–10.3)
CO2: 42 mmol/L — ABNORMAL HIGH (ref 22–32)
Chloride: 90 mmol/L — ABNORMAL LOW (ref 101–111)
Creatinine, Ser: 1.09 mg/dL — ABNORMAL HIGH (ref 0.44–1.00)
GFR, EST NON AFRICAN AMERICAN: 53 mL/min — AB (ref >60)
GLUCOSE: 208 mg/dL — AB (ref 65–99)
POTASSIUM: 4 mmol/L (ref 3.5–5.1)
Sodium: 140 mmol/L (ref 135–145)

## 2016-05-02 LAB — CULTURE, BLOOD (ROUTINE X 2): SPECIMEN DESCRIPTION: ADEQUATE

## 2016-05-02 LAB — GLUCOSE, CAPILLARY
GLUCOSE-CAPILLARY: 177 mg/dL — AB (ref 65–99)
GLUCOSE-CAPILLARY: 232 mg/dL — AB (ref 65–99)
GLUCOSE-CAPILLARY: 295 mg/dL — AB (ref 65–99)
Glucose-Capillary: 268 mg/dL — ABNORMAL HIGH (ref 65–99)

## 2016-05-02 LAB — CBC
HEMATOCRIT: 31.7 % — AB (ref 35.0–47.0)
HEMOGLOBIN: 10.6 g/dL — AB (ref 12.0–16.0)
MCH: 31.8 pg (ref 26.0–34.0)
MCHC: 33.5 g/dL (ref 32.0–36.0)
MCV: 94.8 fL (ref 80.0–100.0)
Platelets: 157 10*3/uL (ref 150–440)
RBC: 3.34 MIL/uL — AB (ref 3.80–5.20)
RDW: 14.5 % (ref 11.5–14.5)
WBC: 7.4 10*3/uL (ref 3.6–11.0)

## 2016-05-02 NOTE — Progress Notes (Signed)
Inpatient Diabetes Program Recommendations  AACE/ADA: New Consensus Statement on Inpatient Glycemic Control (2015)  Target Ranges:  Prepandial:   less than 140 mg/dL      Peak postprandial:   less than 180 mg/dL (1-2 hours)      Critically ill patients:  140 - 180 mg/dL   Results for Gabrielle [name], Gabrielle [name] (MRN [id number]) as of 05/02/2016 11:28  Ref. Range 05/01/2016 07:37 05/01/2016 11:08 05/01/2016 16:39 05/01/2016 20:57 05/02/2016 07:42  Glucose-Capillary Latest Ref Range: 65 - 99 mg/dL 179 (H) 195 (H) 200 (H) 235 (H) 177 (H)   Review of Glycemic Control  Diabetes history: DM Outpatient Diabetes medications: Lantus 85 units, Humalog 1 units per every 10 grams of carbs Current orders for Inpatient glycemic control: Lantus 85 units, Novolog 0-9 units tid + Novolog 0-5 units qhs + Novolog 3 units tid meal coverage  Inpatient Diabetes Program Recommendations:   Patient's glucose increases after meals still. Patient takes 1 unit per every 10 grams of carbs. Please consider increasing meal coverage to Novolog 5 units tid in addition to correction scale.  Thanks,  Tama Headings RN, MSN, [facility] Inpatient Diabetes Coordinator Team Pager [phone] (8a-5p)

## 2016-05-02 NOTE — Progress Notes (Signed)
qPhysical Therapy Treatment Patient Details Name: Gabrielle [name] MRN: [id number] DOB: [birth year] Today's Date: 05/02/2016    History of Present Illness Patient is a 66 y.o. female admitted on 23 March with R LE cellulitis. 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    PT Comments    Patient seen this PM for assist with sit/stand from recliner, toilet transfer to/from bari [facility] and eventual return to bed.  PAtient very motivated to participate and progress as able; improving in overall movement fluidity and confidence.  Continue to provide +3 for safety; however, patient completing largely under own power without buckling or overt LOB. Extensive/total assist for personal hygiene due to body habitus; unable to manage indep.    Follow Up Recommendations  SNF     Equipment Recommendations       Recommendations for Other Services       Precautions / Restrictions Precautions Precautions: Fall Restrictions Weight Bearing Restrictions: No    Mobility  Bed Mobility Overal bed mobility: Needs Assistance Bed Mobility: Supine to Sit     Supine to sit: Max assist (+3 for trunk control, LE management)        Transfers Overall transfer level: Needs assistance Equipment used: Standard walker (patient family brought personal walker from home) Transfers: Sit to/from Stand Sit to Stand: Min assist (+3 for safety)            Ambulation/Gait Ambulation/Gait assistance: Min assist Ambulation Distance (Feet): 5 Feet Assistive device: Standard walker       General Gait Details: broad BOS with forward flexed posture, heavy WBing bilat UEs. Excessive lateral weight shift to unweight/advance bilat LEs   Stairs            Wheelchair Mobility    Modified Rankin (Stroke Patients Only)       Balance Overall balance assessment: Needs assistance Sitting-balance support: No upper extremity supported;Feet supported Sitting balance-Leahy  Scale: Normal     Standing balance support: Bilateral upper extremity supported Standing balance-Leahy Scale: Fair                              Cognition Arousal/Alertness: Awake/alert Behavior During Therapy: WFL for tasks assessed/performed Overall Cognitive Status: Within Functional Limits for tasks assessed                                 General Comments: very motivated to participate/progress as able      Exercises Other Exercises Other Exercises: Sit/stand from bari BSC with SW, min assist +3 for safety; slow and guarded, heavy use of bilat UEs to complete Other Exercises: Dep assist for hygiene after continent bowel/bladder episode; static standing with SW, min assist +2 for 2- 40minutes prior to need for seated rest    General Comments        Pertinent Vitals/Pain Pain Assessment: No/denies pain    Home Living                      Prior Function            PT Goals (current goals can now be found in the care plan section) Acute Rehab PT Goals Patient Stated Goal: "To go home" PT Goal Formulation: With patient Time For Goal Achievement: 05/14/16 Potential to Achieve Goals: Fair Progress towards PT goals: Progressing toward goals  Frequency    Min 2X/week      PT Plan Current plan remains appropriate    Co-evaluation             End of Session Equipment Utilized During Treatment: Gait belt;Other (comment) Activity Tolerance: Patient tolerated treatment well Patient left: in bed;with nursing/sitter in room   PT Visit Diagnosis: Muscle weakness (generalized) (M62.81);Pain Pain - Right/Left: Right Pain - part of body: Leg     Time: 1351-1415 PT Time Calculation (min) (ACUTE ONLY): 24 min  Charges:  $Therapeutic Activity: 23-37 mins                    G Codes:      Conswella Bruney H. Owens Shark, PT, DPT, NCS 05/02/16, 10:39 PM [phone]

## 2016-05-02 NOTE — Plan of Care (Signed)
Problem: Activity: Goal: Risk for activity intolerance will decrease Outcome: Progressing Patient able to get up to recliner with assistance from PT. Patient comfortable and feels better sitting up.   Deri Fuelling, RN

## 2016-05-02 NOTE — Progress Notes (Signed)
[place] at [place] NAME: Gabrielle [name]    MR#:  [id number]  DATE OF BIRTH:  May 15, 1950  SUBJECTIVE:  Need SNF. RLE cellulitis improving- still significant redness. BCID + Pseudomonas  REVIEW OF SYSTEMS:    Review of Systems  Constitutional: Negative.  Negative for chills, fever and malaise/fatigue.  HENT: Negative.  Negative for ear discharge, ear pain, hearing loss, nosebleeds and sore throat.   Eyes: Negative.  Negative for blurred vision and pain.  Respiratory: Negative.  Negative for cough, hemoptysis, shortness of breath and wheezing.   Cardiovascular: Positive for leg swelling. Negative for chest pain and palpitations.  Gastrointestinal: Negative.  Negative for abdominal pain, blood in stool, diarrhea, nausea and vomiting.  Genitourinary: Negative.  Negative for dysuria.  Musculoskeletal: Negative.  Negative for back pain.  Skin:       cellulitis  Neurological: Negative for dizziness, tremors, speech change, focal weakness, seizures and headaches.  Endo/Heme/Allergies: Negative.  Does not bruise/bleed easily.  Psychiatric/Behavioral: Negative.  Negative for depression, hallucinations and suicidal ideas.    Tolerating Diet: yes      DRUG ALLERGIES:   Allergies  Allergen Reactions  . Lasix [Furosemide]     Numbness and tingling of the face   . Ultram [Tramadol] Other (See Comments)    Pt reports, "makes my head feel funny."  . Erythromycin Rash  . Tetracyclines & Related Rash    VITALS:  Blood pressure (!) 144/48, pulse 88, temperature 98.2 F (36.8 C), temperature source Oral, resp. rate 19, height 5\' 6"  (1.676 m), weight (!) 213.6 kg (471 lb), SpO2 97 %.  PHYSICAL EXAMINATION:   Physical Exam  Constitutional: She is oriented to person, place, and time. No distress.  Morbidly obese  HENT:  Head: Normocephalic.  Eyes: No scleral icterus.  Neck: Normal range of motion. Neck supple. No JVD present. No tracheal  deviation present.  Cardiovascular: Normal rate, regular rhythm and normal heart sounds.  Exam reveals no gallop and no friction rub.   No murmur heard. Pulmonary/Chest: Effort normal and breath sounds normal. No respiratory distress. She has no wheezes. She has no rales. She exhibits no tenderness.  Abdominal: Soft. Bowel sounds are normal. She exhibits no distension and no mass. There is no tenderness. There is no rebound and no guarding.  Musculoskeletal: She exhibits edema and tenderness.  Neurological: She is alert and oriented to person, place, and time.  Skin: Skin is warm. No rash noted. No erythema.  Large area of cellulitis right lower extremity with marked erythema, edema and tenderness   Psychiatric: Affect and judgment normal.      LABORATORY PANEL:   CBC  Recent Labs Lab 05/02/16 0345  WBC 7.4  HGB 10.6*  HCT 31.7*  PLT 157   ------------------------------------------------------------------------------------------------------------------  Chemistries   Recent Labs Lab 05/02/16 0345  NA 140  K 4.0  CL 90*  CO2 42*  GLUCOSE 208*  BUN 29*  CREATININE 1.09*  CALCIUM 9.1   ------------------------------------------------------------------------------------------------------------------  Cardiac Enzymes No results for input(s): TROPONINI in the last 168 hours. ------------------------------------------------------------------------------------------------------------------  RADIOLOGY:  US Venous Img Lower Unilateral Right  Result Date: 05/01/2016 CLINICAL DATA:  Lower extremity edema EXAM: RIGHT LOWER EXTREMITY VENOUS DUPLEX ULTRASOUND TECHNIQUE: Gray-scale sonography with graded compression, as well as color Doppler and duplex ultrasound were performed to evaluate the right lower extremity deep venous system from the level of the common femoral vein and including the common femoral, femoral, profunda femoral, popliteal and  calf veins including the  posterior tibial, peroneal and gastrocnemius veins when visible. The superficial great saphenous vein was also interrogated. Spectral Doppler was utilized to evaluate flow at rest and with distal augmentation maneuvers in the common femoral, femoral and popliteal veins. COMPARISON:  None. FINDINGS: Contralateral Common Femoral Vein: Respiratory phasicity is normal and symmetric with the symptomatic side. No evidence of thrombus. Normal compressibility. Common Femoral Vein: No evidence of thrombus. Normal compressibility, respiratory phasicity and response to augmentation. Saphenofemoral Junction: No evidence of thrombus. Normal compressibility and flow on color Doppler imaging. Profunda Femoral Vein: No evidence of thrombus. Normal compressibility and flow on color Doppler imaging. Femoral Vein: No evidence of thrombus. Normal compressibility, respiratory phasicity and response to augmentation. Popliteal Vein: No evidence of thrombus. Normal compressibility, respiratory phasicity and response to augmentation. Calf Veins: No evidence of thrombus. Normal compressibility and flow on color Doppler imaging. Superficial Great Saphenous Vein: No evidence of thrombus. Normal compressibility and flow on color Doppler imaging. Venous Reflux:  None. Other Findings:  None. IMPRESSION: No evidence of right lower extremity deep venous thrombosis. Left common femoral vein also patent. Electronically Signed   By: Lowella Grip III M.D.   On: 05/01/2016 15:31     ASSESSMENT AND PLAN:   66 year old morbidly obese female with a history of sleep apnea and diabetes who presents with sepsis  1. Sepsis due to right lower extremity cellulitis and Pseudomonas bacteremia: Continue cefepime and f/u final blood cultures with sensitivities   Sensitive to cefepime and cipro.  2. Right lower extremity cellulitis: Continue cefepime and elevate leg Continue Zaroxolyn Ordered lower extremity Doppler to evaluate for DVT-  negative.  3. Chronic lower extremity edema from venous insufficiency: Continue Edecrin  Watch Co2- gradually rising. Continue Foley for now  4. Sleep apnea with morbid obesity: BiPAP at night Weight loss is recommended   5. Diabetes: Continue sliding scale insulin, ADA diet and Lantus  6. Anxiety: Continue Ativan  7. Chronic respiratory failure on 2 L oxygen   Physical therapy consultation for disposition   Management plans discussed with the patient and she is in agreement.  CODE STATUS: full  TOTAL TIME TAKING CARE OF THIS PATIENT: 35 minutes.    POSSIBLE D/C 1-2 days to SNF , DEPENDING ON CLINICAL CONDITION.   Vaughan Basta M.D on 05/02/2016 at 8:52 PM  Between 7am to 6pm - Pager - [phone]  After 6pm go to [email or web address] - password [email or web address] [place] [facility]  Office  [phone]  CC: Primary care physician; Alvester Chou, NP  Note: This dictation was prepared with Dragon dictation along with smaller phrase technology. Any transcriptional errors that result from this process are unintentional.

## 2016-05-02 NOTE — Progress Notes (Signed)
Confirmation number [id number] for size wise to come fix patient's foot of her bed.  Deri Fuelling, RN

## 2016-05-02 NOTE — Progress Notes (Addendum)
Pt CO pain in her bottom. Adjusted the bed to a firmer alternating setting and looked at the patients bottom. No signs of redness or abnormality. After tylenol the patient states she feels much better. VSS. Bi pap on at night. Pt voices multiple concerns to RN this evening. RN helped to write down concerns so that pt could remember to share with MD in AM.

## 2016-05-02 NOTE — Progress Notes (Addendum)
Patient sustained a small skin tear on the back of her right thigh while sitting on the bariatric bedside commode. Small bloody drainage. RN cleansed with NS and applied a pink foam on it.   Deri Fuelling, RN

## 2016-05-02 NOTE — Progress Notes (Addendum)
Clinical Education officer, museum (CSW) met with patient to give her only bed offer [facility]. Patient was sitting up in the chair and stated that she did better today with PT but the recommendation is still SNF. Patient accepted bed offer from [facility]. Patient reported that she has a bipap machine at home that she will get her son to bring it up to [facility]. Patient asked if she can bring her lift recliner and electric wheel chair from home to [facility]. Doug admissions coordinator at [facility] is aware of accepted bed offer and will get back to CSW about patient's equipment questions. CSW will continue to follow and assist as needed.    Per Marden Noble they will try to accommodate all of patient's equipment however they will have to see how much room patient will have. CSW made patient aware of above.   [employer], LCSW [phone]

## 2016-05-02 NOTE — Progress Notes (Signed)
qPhysical Therapy Treatment Patient Details Name: Gabrielle [name] MRN: [id number] DOB: 1950/04/02 Today's Date: 05/02/2016    History of Present Illness Patient is a 66 y.o. female admitted on 23 March with R LE cellulitis. 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    PT Comments    Pt stated she was feeling much better this morning w/ some minor pain in the R LE. She displayed improved mobility and able to participate in moving to sitting at EOB w/ mod assist +2. Pt was able to transfer to standing w/ BRW and required +3 min assist for safety, and able to take a few steps to turn and sit in the recliner w/ min assist +3. Her O2 sat dropped to 86% on 2 L/min during first transfer attempt and pt states this is normal, O2 increased to 93% after a minute of sitting on 2L/min. Pt is progressing but still requires some assistance to safely perform her basic mobility tasks, she will continue to benefit from skilled PT to improve her strength and return to her prior level of function, continue to recommend STR following acute hospitalization.     Follow Up Recommendations  SNF     Equipment Recommendations  None recommended by PT    Recommendations for Other Services       Precautions / Restrictions Precautions Precautions: Fall Restrictions Weight Bearing Restrictions: No    Mobility  Bed Mobility Overal bed mobility: Needs Assistance Bed Mobility: Supine to Sit     Supine to sit: Mod assist;+2 for physical assistance     General bed mobility comments: pt able to progress LE over EOB and required mod assist +2 to assist with lifting trunk to sitting position  Transfers Overall transfer level: Needs assistance Equipment used: Rolling walker (2 wheeled) Transfers: Sit to/from Stand Sit to Stand: Min assist;+2 physical assistance (+3 for safety)         General transfer comment: pt requires use of BRW and min assist +2, elevated the bed  slighlty and pt able to move to standing w/ +3 for safety, O2 dropped to 85% on first attempt but returned to 93% after sitting for a minute  Ambulation/Gait Ambulation/Gait assistance: +2 safety/equipment;Min assist Ambulation Distance (Feet): 2 Feet Assistive device: Rolling walker (2 wheeled) Gait Pattern/deviations: Step-to pattern;Shuffle;Wide base of support;Trunk flexed   Gait velocity interpretation: <1.8 ft/sec, indicative of risk for recurrent falls General Gait Details: pt able to take small shuflling steps and perform stand pivot transfer to recliner, min assist/guarding +3 for safety, requried cues for stepping towards chair   Stairs            Wheelchair Mobility    Modified Rankin (Stroke Patients Only)       Balance Overall balance assessment: Needs assistance Sitting-balance support: Bilateral upper extremity supported;Feet supported Sitting balance-Leahy Scale: Fair Sitting balance - Comments: able to maintain seated posture w/o back support at EOB   Standing balance support: Bilateral upper extremity supported Standing balance-Leahy Scale: Poor Standing balance comment: requires bilat UE support and min assist +3 to maintain standing, wide BOS                             Cognition Arousal/Alertness: Awake/alert Behavior During Therapy: WFL for tasks assessed/performed Overall Cognitive Status: Within Functional Limits for tasks assessed  Exercises General Exercises - Lower Extremity Ankle Circles/Pumps: AROM;Strengthening;Both;10 reps;Supine Quad Sets: AROM;Strengthening;Both;10 reps;Supine Heel Slides: AROM;Strengthening;Both;10 reps;Supine Hip ABduction/ADduction: AROM;Strengthening;Both;10 reps;Supine Straight Leg Raises: AROM;Strengthening;Both;10 reps;Supine    General Comments        Pertinent Vitals/Pain Faces Pain Scale: Hurts a little bit Pain Location: R LE Pain  Descriptors / Indicators: Aching Pain Intervention(s): Monitored during session;Limited activity within patient's tolerance;Repositioned    Home Living                      Prior Function            PT Goals (current goals can now be found in the care plan section) Acute Rehab PT Goals Patient Stated Goal: "To go home" PT Goal Formulation: With patient Time For Goal Achievement: 05/14/16 Potential to Achieve Goals: Good Progress towards PT goals: Progressing toward goals    Frequency    Min 2X/week      PT Plan Current plan remains appropriate    Co-evaluation             End of Session Equipment Utilized During Treatment: Oxygen Activity Tolerance: Patient tolerated treatment well;Patient limited by fatigue Patient left: in chair;with call bell/phone within reach;with nursing/sitter in room Nurse Communication: Mobility status PT Visit Diagnosis: Muscle weakness (generalized) (M62.81);Pain Pain - Right/Left: Right Pain - part of body: Leg     Time: 0916-1010 PT Time Calculation (min) (ACUTE ONLY): 54 min  Charges:                       G Codes:       [employer] Student PT 05/02/16, 11:27 AM [phone]    Gabrielle [name] 05/02/2016, 11:22 AM

## 2016-05-03 DIAGNOSIS — E662 Morbid (severe) obesity with alveolar hypoventilation: Secondary | ICD-10-CM

## 2016-05-03 DIAGNOSIS — J9622 Acute and chronic respiratory failure with hypercapnia: Secondary | ICD-10-CM

## 2016-05-03 DIAGNOSIS — J9621 Acute and chronic respiratory failure with hypoxia: Secondary | ICD-10-CM

## 2016-05-03 LAB — BASIC METABOLIC PANEL
Anion gap: 9 (ref 5–15)
BUN: 29 mg/dL — AB (ref 6–20)
CHLORIDE: 87 mmol/L — AB (ref 101–111)
CO2: 42 mmol/L — AB (ref 22–32)
CREATININE: 1.07 mg/dL — AB (ref 0.44–1.00)
Calcium: 9.1 mg/dL (ref 8.9–10.3)
GFR calc non Af Amer: 54 mL/min — ABNORMAL LOW (ref >60)
Glucose, Bld: 270 mg/dL — ABNORMAL HIGH (ref 65–99)
POTASSIUM: 4.3 mmol/L (ref 3.5–5.1)
Sodium: 138 mmol/L (ref 135–145)

## 2016-05-03 LAB — BLOOD GAS, ARTERIAL
Acid-Base Excess: 20.8 mmol/L — ABNORMAL HIGH (ref 0.0–2.0)
BICARBONATE: 48.7 mmol/L — AB (ref 20.0–28.0)
FIO2: 0.28
O2 Saturation: 94.3 %
PCO2 ART: 70 mmHg — AB (ref 32.0–48.0)
PH ART: 7.45 (ref 7.350–7.450)
PO2 ART: 69 mmHg — AB (ref 83.0–108.0)
Patient temperature: 37

## 2016-05-03 LAB — GLUCOSE, CAPILLARY
GLUCOSE-CAPILLARY: 195 mg/dL — AB (ref 65–99)
Glucose-Capillary: 242 mg/dL — ABNORMAL HIGH (ref 65–99)
Glucose-Capillary: 252 mg/dL — ABNORMAL HIGH (ref 65–99)
Glucose-Capillary: 229 mg/dL — ABNORMAL HIGH (ref 65–99)

## 2016-05-03 NOTE — Progress Notes (Signed)
[facility] at [place] NAME: Gabrielle [name]    MR#:  [id number]  DATE OF BIRTH:  1950/11/07  SUBJECTIVE:  Need SNF. RLE cellulitis improving- still significant redness. BCID + Pseudomonas She is not using her BiPAP at night.  REVIEW OF SYSTEMS:    Review of Systems  Constitutional: Negative.  Negative for chills, fever and malaise/fatigue.  HENT: Negative.  Negative for ear discharge, ear pain, hearing loss, nosebleeds and sore throat.   Eyes: Negative.  Negative for blurred vision and pain.  Respiratory: Negative.  Negative for cough, hemoptysis, shortness of breath and wheezing.   Cardiovascular: Positive for leg swelling. Negative for chest pain and palpitations.  Gastrointestinal: Negative.  Negative for abdominal pain, blood in stool, diarrhea, nausea and vomiting.  Genitourinary: Negative.  Negative for dysuria.  Musculoskeletal: Negative.  Negative for back pain.  Skin:       cellulitis  Neurological: Negative for dizziness, tremors, speech change, focal weakness, seizures and headaches.  Endo/Heme/Allergies: Negative.  Does not bruise/bleed easily.  Psychiatric/Behavioral: Negative.  Negative for depression, hallucinations and suicidal ideas.    Tolerating Diet: yes      DRUG ALLERGIES:   Allergies  Allergen Reactions  . Lasix [Furosemide]     Numbness and tingling of the face   . Ultram [Tramadol] Other (See Comments)    Pt reports, "makes my head feel funny."  . Erythromycin Rash  . Tetracyclines & Related Rash    VITALS:  Blood pressure (!) 140/54, pulse 86, temperature 97.6 F (36.4 C), temperature source Oral, resp. rate 20, height 5\' 6"  (1.676 m), weight (!) 214.4 kg (472 lb 9.6 oz), SpO2 96 %.  PHYSICAL EXAMINATION:   Physical Exam  Constitutional: She is oriented to person, place, and time. No distress.  Morbidly obese  HENT:  Head: Normocephalic.  Eyes: No scleral icterus.  Neck: Normal range of  motion. Neck supple. No JVD present. No tracheal deviation present.  Cardiovascular: Normal rate, regular rhythm and normal heart sounds.  Exam reveals no gallop and no friction rub.   No murmur heard. Pulmonary/Chest: Effort normal and breath sounds normal. No respiratory distress. She has no wheezes. She has no rales. She exhibits no tenderness.  Abdominal: Soft. Bowel sounds are normal. She exhibits no distension and no mass. There is no tenderness. There is no rebound and no guarding.  Musculoskeletal: She exhibits edema and tenderness.  Neurological: She is alert and oriented to person, place, and time.  Skin: Skin is warm. No rash noted. No erythema.  Large area of cellulitis right lower extremity with marked erythema, edema and tenderness   Psychiatric: Affect and judgment normal.      LABORATORY PANEL:   CBC  Recent Labs Lab 05/02/16 0345  WBC 7.4  HGB 10.6*  HCT 31.7*  PLT 157   ------------------------------------------------------------------------------------------------------------------  Chemistries   Recent Labs Lab 05/03/16 0332  NA 138  K 4.3  CL 87*  CO2 42*  GLUCOSE 270*  BUN 29*  CREATININE 1.07*  CALCIUM 9.1   ------------------------------------------------------------------------------------------------------------------  Cardiac Enzymes No results for input(s): TROPONINI in the last 168 hours. ------------------------------------------------------------------------------------------------------------------  RADIOLOGY:  No results found.   ASSESSMENT AND PLAN:   66 year old morbidly obese female with a history of sleep apnea and diabetes who presents with sepsis  1. Sepsis due to right lower extremity cellulitis and Pseudomonas bacteremia: Continue cefepime and f/u final blood cultures with sensitivities   Sensitive to cefepime and cipro.  2.  Right lower extremity cellulitis: Continue cefepime and elevate leg Continue  Zaroxolyn Ordered lower extremity Doppler to evaluate for DVT- negative.  3. Chronic lower extremity edema from venous insufficiency: Continue Edecrin  Watch Co2- gradually rising. Continue Foley for now for her comfort.  4. Sleep apnea with morbid obesity: BiPAP at night Weight loss is recommended She is non compliant with Bipap, claims pressure is too high. She is a chronic CO2 retainer- as she is still completely alert and oriented inspite of CO2 and bicarb being high. Called pulm consult to help reset her Bipap settings.    5. Diabetes: Continue sliding scale insulin, ADA diet and Lantus  6. Anxiety: Continue Ativan  7. Chronic respiratory failure on 2 L oxygen   Physical therapy consultation for disposition   Management plans discussed with the patient and she is in agreement.  CODE STATUS: full  TOTAL TIME TAKING CARE OF THIS PATIENT: 35 minutes.    POSSIBLE D/C 1-2 days to SNF , DEPENDING ON CLINICAL CONDITION.   Vaughan Basta M.D on 05/03/2016 at 9:42 PM  Between 7am to 6pm - Pager - [phone]  After 6pm go to [email or web address] - password [email or web address] [place] [facility]  Office  [phone]  CC: Primary care physician; Alvester Chou, NP  Note: This dictation was prepared with Dragon dictation along with smaller phrase technology. Any transcriptional errors that result from this process are unintentional.

## 2016-05-03 NOTE — Consult Note (Addendum)
[facility] Consultation      Assessment  Severe obesity with obesity hypoventilation syndrome, and chronic hypercapnic respiratory failure. BiPAP intolerance due to elevated pressures. Volume overload with chronic venous stasis. Pulmonary hypertension, likely secondary to above issues.  Plan  -My office will contact advanced wound care and change her BiPAP settings from 25/16 to 15/7 with 2 L.. It is doubtful that this will be adequate to treat her sleep apnea, but it was hopefully enough treat her hypercapnia, and can hopefully be tolerated better than the previous pressure, which she was not using at all. In addition, the patient normally sits up in a recliner to sleep, and her sleep apnea is probably not as bad as it was at the sleep lab where she was lying down flat. -Weight loss would obviously be beneficial for her. -No need for further workup of her pulmonary hypertension, as is likely secondary to above problems. In addition, she would not be candidate for primary therapy at this time given her comorbidities.     Date: 05/03/2016  MRN# [id number] Gabrielle [name] 08-17-1950  Referring Physician:   Aviyana [name] is a 66 y.o. old female seen in consultation for chief complaint of:    Chief Complaint  Patient presents with  . Weakness  . Cellulitis    HPI:   The patient is a 66 year old morbidly obese female with a BMI of 73. Patient is home and bedbound due to her weight, and does not leave home other than urgent medical appointments, on these occasions she has to go by EMS. She was admitted to the hospital on 04/29/16 due to right lower extremity cellulitis. She has subsequently been started on antibiotics. Pulmonary service is consulted today due to the history of obstructive sleep apnea. She has been noted in the past to have elevated CO2 levels, she was sent for a sleep study on 11/19/15. Review of the study. Her sleep apnea was severe. She was ultimately  placed on BiPAP at a pressure of 25/18, however, even at these pressures. Her sleep apnea was inadequately treated with a residual AHI of greater than 20. She was subsequent restarted on BiPAP in the home environment. The blood pressure, however, she did not tolerate this and felt that her lungs were "blowing up". She is also very concerned about her history of apparent only hypertension and how this pressure may affect her pulmonary hypertension. In addition, she notes that she was not started on oxygen by the BiPAP. On review of the sleep study. It does show that she had hypoxic episodes. However, oxygen, does not appear to have been recommended.  While in the hospital. She is been started on BiPAP empirically by respiratory therapy. I will level of 12/5, she has tolerated this better, but has trouble even with this pressure. She would like [place] to prescribe a lower pressure and see if this is tolerated and then increase as tolerated. Review of previous blood gases shows a PCO2 range between 70-80, and usually appears to be compensated.  Echocardiogram 07/25/15; EF equals 55%. Pulmonary systolic pressure was 51.  PMHX:   Past Medical History:  Diagnosis Date  . Arthritis   . Chronic venous stasis dermatitis of both lower extremities   . Diabetes mellitus without complication ([facility])   . Hypertension   . Morbid obesity ([place])   . Osteoarthritis   . Sleep apnea    Surgical Hx:  Past Surgical History:  Procedure Laterality Date  . NO PAST SURGERIES    .  none     Family Hx:  Family History  Problem Relation Age of Onset  . Diabetes Mellitus II Father   . CAD Father   . CAD Mother   . Hiatal hernia Mother    Social Hx:   Social History  Substance Use Topics  . Smoking status: Never Smoker  . Smokeless tobacco: Never Used  . Alcohol use No   Medication:   @ENCMEDS @    Allergies:  Lasix [furosemide]; Ultram [tramadol]; Erythromycin; and Tetracyclines & related  Review of  Systems: Gen:  Denies  fever, sweats, chills HEENT: Denies blurred vision, double vision. bleeds, sore throat Cvc:  No dizziness, chest pain. Resp:   Denies cough or sputum production, Gi: Denies swallowing difficulty, stomach pain. Gu:  Denies bladder incontinence,  Ext:   No Joint pain, stiffness. Skin: No skin rash,  hives  Endoc:  No polyuria, polydipsia. Psych: No depression, insomnia. Other:  All other systems were reviewed with the patient and were negative other that what is mentioned in the HPI.   Physical Examination:   VS: BP (!) 141/74 (BP Location: Right Wrist)   Pulse 88   Temp 97.8 F (36.6 C) (Oral)   Resp 18   Ht 5\' 6"  (1.676 m)   Wt (!) 472 lb 9.6 oz (214.4 kg)   SpO2 97%   BMI 76.28 kg/m   General Appearance: No distress , morbidly obese, laying in bed. Neuro:without focal findings,  speech normal,  HEENT: PERRLA, EOM intact.   Pulmonary: normal breath sounds, No wheezing. Decreased air entry bilaterally. CardiovascularNormal S1,S2.  No m/r/g.   Abdomen: Benign, Soft, non-tender. Renal:  No costovertebral tenderness  GU:  No performed at this time. Endoc: No evident thyromegaly, no signs of acromegaly. Skin:   Right lower extremity rash anteriorly on the lower limb. Extremities: 3+ bilateral lower extremity edema.  Other findings:    LABORATORY PANEL:   CBC  Recent Labs Lab 05/02/16 0345  WBC 7.4  HGB 10.6*  HCT 31.7*  PLT 157   ------------------------------------------------------------------------------------------------------------------  Chemistries   Recent Labs Lab 05/03/16 0332  NA 138  K 4.3  CL 87*  CO2 42*  GLUCOSE 270*  BUN 29*  CREATININE 1.07*  CALCIUM 9.1   ------------------------------------------------------------------------------------------------------------------  Cardiac Enzymes No results for input(s): TROPONINI in the last 168  hours. ------------------------------------------------------------  RADIOLOGY:  US Venous Img Lower Unilateral Right  Result Date: 05/01/2016 CLINICAL DATA:  Lower extremity edema EXAM: RIGHT LOWER EXTREMITY VENOUS DUPLEX ULTRASOUND TECHNIQUE: Gray-scale sonography with graded compression, as well as color Doppler and duplex ultrasound were performed to evaluate the right lower extremity deep venous system from the level of the common femoral vein and including the common femoral, femoral, profunda femoral, popliteal and calf veins including the posterior tibial, peroneal and gastrocnemius veins when visible. The superficial great saphenous vein was also interrogated. Spectral Doppler was utilized to evaluate flow at rest and with distal augmentation maneuvers in the common femoral, femoral and popliteal veins. COMPARISON:  None. FINDINGS: Contralateral Common Femoral Vein: Respiratory phasicity is normal and symmetric with the symptomatic side. No evidence of thrombus. Normal compressibility. Common Femoral Vein: No evidence of thrombus. Normal compressibility, respiratory phasicity and response to augmentation. Saphenofemoral Junction: No evidence of thrombus. Normal compressibility and flow on color Doppler imaging. Profunda Femoral Vein: No evidence of thrombus. Normal compressibility and flow on color Doppler imaging. Femoral Vein: No evidence of thrombus. Normal compressibility, respiratory phasicity and response to augmentation. Popliteal Vein: No evidence  of thrombus. Normal compressibility, respiratory phasicity and response to augmentation. Calf Veins: No evidence of thrombus. Normal compressibility and flow on color Doppler imaging. Superficial Great Saphenous Vein: No evidence of thrombus. Normal compressibility and flow on color Doppler imaging. Venous Reflux:  None. Other Findings:  None. IMPRESSION: No evidence of right lower extremity deep venous thrombosis. Left common femoral vein also  patent. Electronically Signed   By: Lowella Grip III M.D.   On: 05/01/2016 15:31       Thank  you for the consultation and for allowing [facility] to assist in the care of your patient. Our recommendations are noted above.  Please contact us if we can be of further service.   Marda Stalker, MD.  Board Certified in Internal Medicine, Pulmonary Medicine, [place], and Sleep Medicine.  [facility] Office Number: [phone]  Patricia Pesa, M.D.  Merton Border, M.D  05/03/2016

## 2016-05-03 NOTE — Progress Notes (Signed)
Inpatient Diabetes Program Recommendations  AACE/ADA: New Consensus Statement on Inpatient Glycemic Control (2015)  Target Ranges:  Prepandial:   less than 140 mg/dL      Peak postprandial:   less than 180 mg/dL (1-2 hours)      Critically ill patients:  140 - 180 mg/dL   Results for NYEISHA, GOODALL (MRN [id number]) as of 05/03/2016 09:42  Ref. Range 05/02/2016 07:42 05/02/2016 11:44 05/02/2016 16:32 05/02/2016 21:19  Glucose-Capillary Latest Ref Range: 65 - 99 mg/dL 177 (H) 232 (H) 268 (H) 295 (H)   Results for ENOLA, SIEBERS (MRN [id number]) as of 05/03/2016 09:42  Ref. Range 05/03/2016 07:56  Glucose-Capillary Latest Ref Range: 65 - 99 mg/dL 242 (H)    Home DM Meds: Lantus 85 units daily       Humalog 1 unit for every 10 grams of Carbohydrates  Current Insulin Orders: Lantus 85 units daily      Novolog Sensitive Correction Scale/ SSI (0-9 units) TID AC + HS      Novolog 3 units TID with meals     MD- Please consider the following in-hospital insulin adjustments:  1. Increase Lantus slightly to 90 units daily  2. Increase Novolog Meal Coverage to: Novolog 6 units TID with meals (hold if pt eats <50% of meal)     --Will follow patient during hospitalization--  Wyn Quaker RN, MSN, CDE Diabetes Coordinator Inpatient Glycemic Control Team Team Pager: [phone] (8a-5p)

## 2016-05-03 NOTE — Progress Notes (Signed)
Per MD patient will likely D/C tomorrow. Plan is for patient to D/C to [facility]. Clinical Education officer, museum (CSW) sent [employer] at [facility] a message making him aware of above. CSW will continue to follow and assist as needed.   [employer], LCSW [phone]

## 2016-05-04 LAB — BLOOD CULTURE ID PANEL (REFLEXED)
ACINETOBACTER BAUMANNII: NOT DETECTED
CANDIDA ALBICANS: NOT DETECTED
CANDIDA PARAPSILOSIS: NOT DETECTED
Candida glabrata: NOT DETECTED
Candida krusei: NOT DETECTED
Candida tropicalis: NOT DETECTED
ENTEROBACTERIACEAE SPECIES: NOT DETECTED
ENTEROCOCCUS SPECIES: NOT DETECTED
Enterobacter cloacae complex: NOT DETECTED
Escherichia coli: NOT DETECTED
HAEMOPHILUS INFLUENZAE: NOT DETECTED
KLEBSIELLA OXYTOCA: NOT DETECTED
Klebsiella pneumoniae: NOT DETECTED
LISTERIA MONOCYTOGENES: NOT DETECTED
METHICILLIN RESISTANCE: DETECTED — AB
NEISSERIA MENINGITIDIS: NOT DETECTED
PSEUDOMONAS AERUGINOSA: NOT DETECTED
Proteus species: NOT DETECTED
SERRATIA MARCESCENS: NOT DETECTED
STAPHYLOCOCCUS AUREUS BCID: NOT DETECTED
STREPTOCOCCUS PNEUMONIAE: NOT DETECTED
STREPTOCOCCUS PYOGENES: NOT DETECTED
Staphylococcus species: DETECTED — AB
Streptococcus agalactiae: NOT DETECTED
Streptococcus species: NOT DETECTED

## 2016-05-04 LAB — BASIC METABOLIC PANEL
Anion gap: 9 (ref 5–15)
BUN: 25 mg/dL — ABNORMAL HIGH (ref 6–20)
CALCIUM: 9.4 mg/dL (ref 8.9–10.3)
CHLORIDE: 88 mmol/L — AB (ref 101–111)
CO2: 42 mmol/L — AB (ref 22–32)
Creatinine, Ser: 1.02 mg/dL — ABNORMAL HIGH (ref 0.44–1.00)
GFR calc Af Amer: >60 mL/min (ref >60)
GFR calc non Af Amer: 57 mL/min — ABNORMAL LOW (ref >60)
GLUCOSE: 228 mg/dL — AB (ref 65–99)
Potassium: 4 mmol/L (ref 3.5–5.1)
Sodium: 139 mmol/L (ref 135–145)

## 2016-05-04 LAB — CULTURE, BLOOD (ROUTINE X 2)
Culture: NO GROWTH
Specimen Description: ADEQUATE

## 2016-05-04 LAB — GLUCOSE, CAPILLARY
Glucose-Capillary: 233 mg/dL — ABNORMAL HIGH (ref 65–99)
Glucose-Capillary: 238 mg/dL — ABNORMAL HIGH (ref 65–99)

## 2016-05-04 MED ORDER — LORAZEPAM 0.5 MG PO TABS: 0.5000 mg | ORAL_TABLET | Freq: Three times a day (TID) | ORAL | 0 refills | Status: DC | PRN

## 2016-05-04 MED ORDER — CIPROFLOXACIN HCL 500 MG PO TABS: 500.0000 mg | ORAL_TABLET | Freq: Two times a day (BID) | ORAL | 0 refills | Status: AC

## 2016-05-04 MED ORDER — INSULIN GLARGINE 100 UNIT/ML ~~LOC~~ SOLN: 85.0000 [IU] | Freq: Every day | SUBCUTANEOUS | 11 refills | Status: DC

## 2016-05-04 MED ORDER — ETHACRYNIC ACID 25 MG PO TABS: 75.0000 mg | ORAL_TABLET | Freq: Two times a day (BID) | ORAL | 0 refills | Status: AC

## 2016-05-04 MED ORDER — INSULIN ASPART 100 UNIT/ML ~~LOC~~ SOLN: 5.0000 [IU] | Freq: Three times a day (TID) | SUBCUTANEOUS | 11 refills | Status: DC

## 2016-05-04 NOTE — Clinical Social Work Placement (Signed)
   CLINICAL SOCIAL WORK PLACEMENT  NOTE  Date:  05/04/2016  Patient Details  Name: Gabrielle [name] MRN: [id number] Date of Birth: [birth year]  Clinical Social Work is seeking post-discharge placement for this patient at the [place] level of care (*CSW will initial, date and re-position this form in  chart as items are completed):  Yes   Patient/family provided with [facility] Work Department's list of facilities offering this level of care within the geographic area requested by the patient (or if unable, by the patient's family).  Yes   Patient/family informed of their freedom to choose among providers that offer the needed level of care, that participate in Medicare, Medicaid or managed care program needed by the patient, have an available bed and are willing to accept the patient.  Yes   Patient/family informed of [facility]'s ownership interest in [facility] and [facility], as well as of the fact that they are under no obligation to receive care at these facilities.  PASRR submitted to EDS on       PASRR number received on       Existing PASRR number confirmed on 05/01/16     FL2 transmitted to all facilities in geographic area requested by pt/family on 05/01/16     FL2 transmitted to all facilities within larger geographic area on       Patient informed that his/her managed care company has contracts with or will negotiate with certain facilities, including the following:        Yes   Patient/family informed of bed offers received.  Patient chooses bed at  [facility] )     Physician recommends and patient chooses bed at      Patient to be transferred to  [facility] ) on 05/04/16.  Patient to be transferred to facility by  [facility] )     Patient family notified on 05/04/16 of transfer.  Name of family member notified:   (Patient's son Legrand Como is aware of D/C today. )     PHYSICIAN        Additional Comment:    _______________________________________________ Asa Fath, Veronia Beets, LCSW 05/04/2016, 1:48 PM

## 2016-05-04 NOTE — Progress Notes (Signed)
Patient is medically stable for D/C to [facility] today. Per [place] admissions coordinator at [facility] patient can come today to semi-private room with nobody in it, room 32-B. RN will call report and arrange EMS for transport. Clinical Education officer, museum (CSW) sent D/C orders to [facility] via [place]. Patient is aware of above. Patient's sister is at bedside and aware of above. Sister has agreed to bring bipap from patient's home to [facility]. CSW contacted patient's son Legrand Como and made him aware of above. Please reconsult if future social work needs arise. CSW signing off.   [employer], LCSW [phone]

## 2016-05-04 NOTE — Discharge Instructions (Signed)
If the Blood pressure runs high- she may need to be restarted on hydralazine, as her previous home dose.  ( Stopped during hospital.)

## 2016-05-04 NOTE — Progress Notes (Signed)
Pt is ready for discharge to [facility]. Report called to Nurse and EMS contacted for transport. Family to be called upon EMS arrival.

## 2016-05-04 NOTE — Progress Notes (Signed)
Attempted to place BIPAP on patient multiple times. Settings adjusted to various pressures and pt. Still unable to tolerate. Pt. Was able to tolerate CPAP  Of 10 cm H2O pressure.  Will continue to monitor.

## 2016-05-04 NOTE — Progress Notes (Signed)
Inpatient Diabetes Program Recommendations  AACE/ADA: New Consensus Statement on Inpatient Glycemic Control (2015)  Target Ranges:  Prepandial:   less than 140 mg/dL      Peak postprandial:   less than 180 mg/dL (1-2 hours)      Critically ill patients:  140 - 180 mg/dL   Results for Gabrielle [name], Gabrielle [name] (MRN [id number]) as of 05/04/2016 10:35  Ref. Range 05/03/2016 07:56 05/03/2016 11:42 05/03/2016 16:39 05/03/2016 21:34 05/04/2016 07:32  Glucose-Capillary Latest Ref Range: 65 - 99 mg/dL 242 (H) 252 (H) 229 (H) 195 (H) 233 (H)   Review of Glycemic Control  Diabetes history: DM2 Outpatient Diabetes medications: Lantus 85 units daily, Humalog 1 unit for every 10 grams of carbohydrates Current orders for Inpatient glycemic control: Lantus 85 units daily, Novolog 0-9 units TID with meals, Novolog 0-5 units QHS, Novolog 3 units TID with meals  Inpatient Diabetes Program Recommendations: Insulin - Basal: Please consider increasing Lantus to 90 units daily. Insulin - Meal Coverage: Please consider increasing meal coverage to Novolog 6 units TID with meals. HgbA1C: Please consider ordering an A1C to evaluate glycemic control over the past 2-3 months.  Thanks, Barnie Alderman, RN, MSN, CDE Diabetes Coordinator Inpatient Diabetes Program [phone] (Team Pager from 8am to 5pm)

## 2016-05-04 NOTE — Care Management Important Message (Signed)
Important Message  Patient Details  Name: Gabrielle [name] MRN: [id number] Date of Birth: August 01, 1950   Medicare Important Message Given:  Yes    Jolly Mango, RN 05/04/2016, 12:04 PM

## 2016-05-04 NOTE — Discharge Summary (Signed)
[facility] NAME: Gabrielle [name]    MR#:  [id number]  DATE OF BIRTH:  [birth year]  DATE OF ADMISSION:  04/29/2016 ADMITTING PHYSICIAN: Gabrielle Grayer, MD  DATE OF DISCHARGE: 05/04/2016  PRIMARY CARE PHYSICIAN: Gabrielle Chou, NP    ADMISSION DIAGNOSIS:  Weakness [R53.1] Cellulitis, unspecified cellulitis site [L03.90]  DISCHARGE DIAGNOSIS:  Active Problems:   Cellulitis   bacteremia  SECONDARY DIAGNOSIS:   Past Medical History:  Diagnosis Date  . Arthritis   . Chronic venous stasis dermatitis of both lower extremities   . Diabetes mellitus without complication ([place])   . Hypertension   . Morbid obesity ([place])   . Osteoarthritis   . Sleep apnea     HOSPITAL COURSE:   1. Sepsis due to right lower extremity cellulitis and Pseudomonas bacteremia:  Continue cefepime and f/u final blood cultures with sensitivities   Sensitive to cefepime and cipro.  repeat bl cx negative.  Will give total of 2 weeks Abx.  2. Right lower extremity cellulitis: Continue cefepime and elevate leg Continue Zaroxolyn Ordered lower extremity Doppler to evaluate for DVT- negative.  3. Chronic lower extremity edema from venous insufficiency: Continue Edecrin  Watch Co2- gradually rising. Continue Foley for now for her comfort.   Once she is able to get up to bathroom, may d/c foley.  4. Sleep apnea with morbid obesity: BiPAP at night Weight loss is recommended She is non compliant with Bipap, claims pressure is too high. She is a chronic CO2 retainer- as she is still completely alert and oriented inspite of CO2 and bicarb being high. Called pulm consult to help reset her Bipap settings.   Appreciated pulmonary help.   5. Diabetes: Continue sliding scale insulin, ADA diet and Lantus  6. Anxiety: Continue Ativan  7. Chronic respiratory failure on 2 L oxygen  DISCHARGE CONDITIONS:   Stable.  CONSULTS OBTAINED:  Treatment  Team:  Gabrielle Basta, MD Gabrielle Mcardle, MD  DRUG ALLERGIES:   Allergies  Allergen Reactions  . Lasix [Furosemide]     Numbness and tingling of the face   . Ultram [Tramadol] Other (See Comments)    Pt reports, "makes my head feel funny."  . Erythromycin Rash  . Tetracyclines & Related Rash    DISCHARGE MEDICATIONS:   Current Discharge Medication List    START taking these medications   Details  ciprofloxacin (CIPRO) 500 MG tablet Take 1 tablet (500 mg total) by mouth 2 (two) times daily. Qty: 20 tablet, Refills: 0    insulin aspart (NOVOLOG) 100 UNIT/ML injection Inject 5 Units into the skin 3 (three) times daily with meals. Qty: 10 mL, Refills: 11      CONTINUE these medications which have CHANGED   Details  ethacrynic acid (EDECRIN) 25 MG tablet Take 3 tablets (75 mg total) by mouth 2 (two) times daily. Qty: 60 tablet, Refills: 0    insulin glargine (LANTUS) 100 UNIT/ML injection Inject 0.85 mLs (85 Units total) into the skin daily. Qty: 10 mL, Refills: 11    LORazepam (ATIVAN) 0.5 MG tablet Take 1 tablet (0.5 mg total) by mouth every 8 (eight) hours as needed for anxiety. Qty: 30 tablet, Refills: 0      CONTINUE these medications which have NOT CHANGED   Details  acetaminophen (TYLENOL) 500 MG tablet Take 500 mg by mouth 3 (three) times daily.    Cholecalciferol (VITAMIN D3) 1000 units CAPS Take 1,000 Units by mouth 3 (three)  times a week.     clobetasol ointment (TEMOVATE) 2.37 % Apply 1 application topically 2 (two) times daily.    fluticasone (FLONASE) 50 MCG/ACT nasal spray Place 1 spray into both nostrils as needed.     nabumetone (RELAFEN) 500 MG tablet Take 1 tablet by mouth daily.     nystatin (NYSTATIN) powder Apply topically 2 (two) times daily. To neck    polyethylene glycol (MIRALAX / GLYCOLAX) packet Take 17 g by mouth daily.    vitamin B-12 (CYANOCOBALAMIN) 1000 MCG tablet Take 1,000 mcg by mouth 3 (three) times a week.      metolazone (ZAROXOLYN) 2.5 MG tablet Take 2.5 mg by mouth 2 (two) times a week. Patient takes on Monday and Thursday      STOP taking these medications     amoxicillin (AMOXIL) 500 MG tablet      hydrALAZINE (APRESOLINE) 25 MG tablet      insulin lispro (HUMALOG) 100 UNIT/ML injection          DISCHARGE INSTRUCTIONS:    Follow with PMD in 2 weeks. May remove Foley catheter, once she is able to get up to bathroom. If Blood pressure stays high, may resume her home med- Hydralazine, we had to stop in hospital as pressure was running low.  If you experience worsening of your admission symptoms, develop shortness of breath, life threatening emergency, suicidal or homicidal thoughts you must seek medical attention immediately by calling 911 or calling your MD immediately  if symptoms less severe.  You Must read complete instructions/literature along with all the possible adverse reactions/side effects for all the Medicines you take and that have been prescribed to you. Take any new Medicines after you have completely understood and accept all the possible adverse reactions/side effects.   Please note  You were cared for by a hospitalist during your hospital stay. If you have any questions about your discharge medications or the care you received while you were in the hospital after you are discharged, you can call the unit and asked to speak with the hospitalist on call if the hospitalist that took care of you is not available. Once you are discharged, your primary care physician will handle any further medical issues. Please note that NO REFILLS for any discharge medications will be authorized once you are discharged, as it is imperative that you return to your primary care physician (or establish a relationship with a primary care physician if you do not have one) for your aftercare needs so that they can reassess your need for medications and monitor your lab values.    Today   CHIEF  COMPLAINT:   Chief Complaint  Patient presents with  . Weakness  . Cellulitis    HISTORY OF PRESENT ILLNESS:  Gabrielle [name]  is a 66 y.o. female presents with weakness and worsening redness of the lower right extremity despite taking antibiotics that she has at home. She states that she's had problems with swelling again. She states that her medications have not been helping very much. She tried antibiotics cephalexin 500 mg twice a day until finished, then she had some Augmentin and then she had amoxicillin. She took her last dose of amoxicillin this morning. She had a shaking chill and a low-grade fever. Normally she is able to transfer into her motorized wheelchair but she couldn't even do that. She felt her muscles wouldn't work.    VITAL SIGNS:  Blood pressure (!) 149/82, pulse 92, temperature 98.3 F (36.8 C),  temperature source Oral, resp. rate 18, height 5\' 6"  (1.676 m), weight (!) 214.4 kg (472 lb 9.6 oz), SpO2 97 %.  I/O:   Intake/Output Summary (Last 24 hours) at 05/04/16 1141 Last data filed at 05/04/16 1059  Gross per 24 hour  Intake              820 ml  Output             6600 ml  Net            -5780 ml    PHYSICAL EXAMINATION:  Constitutional: She is oriented to person, place, and time. No distress.  Morbidly obese  HENT:  Head: Normocephalic.  Eyes: No scleral icterus.  Neck: Normal range of motion. Neck supple. No JVD present. No tracheal deviation present.  Cardiovascular: Normal rate, regular rhythm and normal heart sounds.  Exam reveals no gallop and no friction rub.   No murmur heard. Pulmonary/Chest: Effort normal and breath sounds normal. No respiratory distress. She has no wheezes. She has no rales. She exhibits no tenderness.  Abdominal: Soft. Bowel sounds are normal. She exhibits no distension and no mass. There is no tenderness. There is no rebound and no guarding.  Musculoskeletal: She exhibits edema and tenderness.  Neurological: She is alert and  oriented to person, place, and time.  Skin: Skin is warm. No rash noted. No erythema.  Large area of cellulitis right lower extremity with marked erythema, edema and tenderness   Psychiatric: Affect and judgment normal.   DATA REVIEW:   CBC  Recent Labs Lab 05/02/16 0345  WBC 7.4  HGB 10.6*  HCT 31.7*  PLT 157    Chemistries   Recent Labs Lab 05/04/16 0430  NA 139  K 4.0  CL 88*  CO2 42*  GLUCOSE 228*  BUN 25*  CREATININE 1.02*  CALCIUM 9.4    Cardiac Enzymes No results for input(s): TROPONINI in the last 168 hours.  Microbiology Results  Results for orders placed or performed during the hospital encounter of 04/29/16  Culture, blood (routine x 2)     Status: None   Collection Time: 04/29/16 12:38 PM  Result Value Ref Range Status   Specimen Description BLOOD Blood Culture adequate volume  Final   Special Requests BOTTLES DRAWN AEROBIC AND ANAEROBIC  RFOA  Final   Culture NO GROWTH 5 DAYS  Final   Report Status 05/04/2016 FINAL  Final  Culture, blood (routine x 2)     Status: Abnormal   Collection Time: 04/29/16 12:38 PM  Result Value Ref Range Status   Specimen Description BLOOD Blood Culture adequate volume  Final   Special Requests BOTTLES DRAWN AEROBIC AND ANAEROBIC  LW  Final   Culture  Setup Time   Final    GRAM NEGATIVE RODS ANAEROBIC BOTTLE ONLY CRITICAL RESULT CALLED TO, READ BACK BY AND VERIFIED WITH: SHEEMA HALLAJI ON 04/30/16 AT 1025 [facility] Performed at [facility], [place] [address], [place], [place] [address]    Culture PSEUDOMONAS AERUGINOSA (A)  Final   Report Status 05/02/2016 FINAL  Final   Organism ID, Bacteria PSEUDOMONAS AERUGINOSA  Final      Susceptibility   Pseudomonas aeruginosa - MIC*    CEFTAZIDIME 4 SENSITIVE Sensitive     CIPROFLOXACIN <=0.25 SENSITIVE Sensitive     GENTAMICIN 8 INTERMEDIATE Intermediate     IMIPENEM 2 SENSITIVE Sensitive     PIP/TAZO 8 SENSITIVE Sensitive     CEFEPIME 8 SENSITIVE Sensitive     *  PSEUDOMONAS AERUGINOSA  Blood Culture ID Panel (Reflexed)     Status: Abnormal   Collection Time: 04/29/16 12:38 PM  Result Value Ref Range Status   Enterococcus species NOT DETECTED NOT DETECTED Final   Listeria monocytogenes NOT DETECTED NOT DETECTED Final   Staphylococcus species NOT DETECTED NOT DETECTED Final   Staphylococcus aureus NOT DETECTED NOT DETECTED Final   Streptococcus species NOT DETECTED NOT DETECTED Final   Streptococcus agalactiae NOT DETECTED NOT DETECTED Final   Streptococcus pneumoniae NOT DETECTED NOT DETECTED Final   Streptococcus pyogenes NOT DETECTED NOT DETECTED Final   Acinetobacter baumannii NOT DETECTED NOT DETECTED Final   Enterobacteriaceae species NOT DETECTED NOT DETECTED Final   Enterobacter cloacae complex NOT DETECTED NOT DETECTED Final   Escherichia coli NOT DETECTED NOT DETECTED Final   Klebsiella oxytoca NOT DETECTED NOT DETECTED Final   Klebsiella pneumoniae NOT DETECTED NOT DETECTED Final   Proteus species NOT DETECTED NOT DETECTED Final   Serratia marcescens NOT DETECTED NOT DETECTED Final   Carbapenem resistance NOT DETECTED NOT DETECTED Final   Haemophilus influenzae NOT DETECTED NOT DETECTED Final   Neisseria meningitidis NOT DETECTED NOT DETECTED Final   Pseudomonas aeruginosa DETECTED (A) NOT DETECTED Final    Comment: CRITICAL RESULT CALLED TO, READ BACK BY AND VERIFIED WITH: SHEEMA HALLAJI ON 04/30/16 AT 0953 [place]    Candida albicans NOT DETECTED NOT DETECTED Final   Candida glabrata NOT DETECTED NOT DETECTED Final   Candida krusei NOT DETECTED NOT DETECTED Final   Candida parapsilosis NOT DETECTED NOT DETECTED Final   Candida tropicalis NOT DETECTED NOT DETECTED Final  Culture, blood (single) w Reflex to ID Panel     Status: None (Preliminary result)   Collection Time: 05/03/16 10:27 AM  Result Value Ref Range Status   Specimen Description BLOOD R ARM  Final   Special Requests   Final    BOTTLES DRAWN AEROBIC AND ANAEROBIC Blood  Culture results may not be optimal due to an inadequate volume of blood received in culture bottles   Culture NO GROWTH < 24 HOURS  Final   Report Status PENDING  Incomplete    RADIOLOGY:  No results found.  EKG:   Orders placed or performed during the hospital encounter of 04/29/16  . ED EKG  . ED EKG  . EKG 12-Lead  . EKG 12-Lead      Management plans discussed with the patient, family and they are in agreement.  CODE STATUS:     Code Status Orders        Start     Ordered   04/29/16 1317  Full code  Continuous     04/29/16 1317    Code Status History    Date Active Date Inactive Code Status Order ID Comments User Context   10/02/2015 12:20 AM 10/04/2015  7:45 PM Full Code [id number]  Lance Coon, MD Inpatient   07/21/2015  3:41 AM 07/27/2015  8:21 PM Full Code [id number]  Saundra Shelling, MD Inpatient      TOTAL TIME TAKING CARE OF THIS PATIENT: 35 minutes.    Gabrielle [name] M.D on 05/04/2016 at 11:41 AM  Between 7am to 6pm - Pager - [phone]  After 6pm go to [email or web address] - password [email or web address] [facility]  Office  [phone]  CC: Primary care physician; Gabrielle Chou, NP   Note: This dictation was prepared with Dragon dictation along with smaller phrase technology. Any transcriptional errors that result from this process are unintentional.

## 2016-05-04 NOTE — Progress Notes (Signed)
Pt requesting to be discharged with foley catheter in place. This Probation officer asked Dr. Molli Hazard, who said that this would be ok, rehab can then remove it when they feel it is appropriate.

## 2016-05-04 NOTE — Progress Notes (Signed)
PHARMACY - PHYSICIAN COMMUNICATION CRITICAL VALUE ALERT - BLOOD CULTURE IDENTIFICATION (BCID)  Results for orders placed or performed during the hospital encounter of 04/29/16  Blood Culture ID Panel (Reflexed) (Collected: 05/03/2016 10:27 AM)  Result Value Ref Range   Enterococcus species NOT DETECTED NOT DETECTED   Listeria monocytogenes NOT DETECTED NOT DETECTED   Staphylococcus species DETECTED (A) NOT DETECTED   Staphylococcus aureus NOT DETECTED NOT DETECTED   Methicillin resistance DETECTED (A) NOT DETECTED   Streptococcus species NOT DETECTED NOT DETECTED   Streptococcus agalactiae NOT DETECTED NOT DETECTED   Streptococcus pneumoniae NOT DETECTED NOT DETECTED   Streptococcus pyogenes NOT DETECTED NOT DETECTED   Acinetobacter baumannii NOT DETECTED NOT DETECTED   Enterobacteriaceae species NOT DETECTED NOT DETECTED   Enterobacter cloacae complex NOT DETECTED NOT DETECTED   Escherichia coli NOT DETECTED NOT DETECTED   Klebsiella oxytoca NOT DETECTED NOT DETECTED   Klebsiella pneumoniae NOT DETECTED NOT DETECTED   Proteus species NOT DETECTED NOT DETECTED   Serratia marcescens NOT DETECTED NOT DETECTED   Haemophilus influenzae NOT DETECTED NOT DETECTED   Neisseria meningitidis NOT DETECTED NOT DETECTED   Pseudomonas aeruginosa NOT DETECTED NOT DETECTED   Candida albicans NOT DETECTED NOT DETECTED   Candida glabrata NOT DETECTED NOT DETECTED   Candida krusei NOT DETECTED NOT DETECTED   Candida parapsilosis NOT DETECTED NOT DETECTED   Candida tropicalis NOT DETECTED NOT DETECTED    Name of physician (or Provider) Contacted: Dr. Anselm Jungling  Changes to prescribed antibiotics required: None  Laural Benes, Pharm.D., BCPS Clinical Pharmacist 05/04/2016  1:49 PM

## 2016-05-05 ENCOUNTER — Telehealth: Payer: Self-pay | Admitting: *Deleted

## 2016-05-05 DIAGNOSIS — G4733 Obstructive sleep apnea (adult) (pediatric): Secondary | ICD-10-CM

## 2016-05-05 DIAGNOSIS — J9612 Chronic respiratory failure with hypercapnia: Secondary | ICD-10-CM

## 2016-05-05 NOTE — Telephone Encounter (Signed)
-----   Message from Laverle Hobby, MD sent at 05/03/2016  2:47 PM EDT ----- Regarding: bipap adjustment Patient was seen in inpatient as a consultation. Patient [facility] However the pressures set too high and she is not able to tolerate it. However, the patient is bedbound and homebound due to severe morbid obesity.  Please ask [facility] to decrease her pressure to15/7. In addition, the sleep study showed hypoxia, but she did not have oxygen ordered, she needs to be on 2 L of oxygen with Pap.

## 2016-05-05 NOTE — Telephone Encounter (Signed)
Order placed for BiPAP setting change and to add O2. Nothing further needed.

## 2016-05-06 LAB — CULTURE, BLOOD (SINGLE)

## 2016-05-10 ENCOUNTER — Telehealth: Payer: Self-pay | Admitting: Internal Medicine

## 2016-05-10 NOTE — Telephone Encounter (Signed)
Nurse with [employer] states pt needs clarification on her sleep machine adjustments.

## 2016-05-10 NOTE — Telephone Encounter (Signed)
Spoke with Aniceto Boss and gave her pt's BiPAP settings and to bleed in O2 @ 2L. Nothing further needed.

## 2016-07-08 ENCOUNTER — Other Ambulatory Visit
Admission: RE | Admit: 2016-07-08 | Discharge: 2016-07-08 | Disposition: A | Discharge status: Home / Self Care | Payer: Medicare Other | Source: Other Acute Inpatient Hospital | Attending: Adult Health | Admitting: Adult Health

## 2016-07-08 DIAGNOSIS — Z119 Encounter for screening for infectious and parasitic diseases, unspecified: Secondary | ICD-10-CM | POA: Diagnosis present

## 2016-07-08 DIAGNOSIS — R300 Dysuria: Secondary | ICD-10-CM | POA: Diagnosis present

## 2016-07-08 LAB — CBC WITH DIFFERENTIAL/PLATELET
BASOS ABS: 0 10*3/uL (ref 0–0.1)
Basophils Relative: 0 %
EOS PCT: 3 %
Eosinophils Absolute: 0.2 10*3/uL (ref 0–0.7)
HCT: 38.8 % (ref 35.0–47.0)
Hemoglobin: 12.8 g/dL (ref 12.0–16.0)
Lymphocytes Relative: 22 %
Lymphs Abs: 1.6 10*3/uL (ref 1.0–3.6)
MCH: 30.8 pg (ref 26.0–34.0)
MCHC: 33 g/dL (ref 32.0–36.0)
MCV: 93.3 fL (ref 80.0–100.0)
MONO ABS: 0.6 10*3/uL (ref 0.2–0.9)
Monocytes Relative: 8 %
Neutro Abs: 4.8 10*3/uL (ref 1.4–6.5)
Neutrophils Relative %: 67 %
PLATELETS: 170 10*3/uL (ref 150–440)
RBC: 4.15 MIL/uL (ref 3.80–5.20)
RDW: 13.7 % (ref 11.5–14.5)
WBC: 7.2 10*3/uL (ref 3.6–11.0)

## 2016-07-08 LAB — URINALYSIS, COMPLETE (UACMP) WITH MICROSCOPIC
BILIRUBIN URINE: NEGATIVE
Bacteria, UA: NONE SEEN
HGB URINE DIPSTICK: NEGATIVE
KETONES UR: NEGATIVE mg/dL
Leukocytes, UA: NEGATIVE
Nitrite: NEGATIVE
PH: 5 (ref 5.0–8.0)
PROTEIN: NEGATIVE mg/dL
RBC / HPF: NONE SEEN RBC/hpf (ref 0–5)
Specific Gravity, Urine: 1.008 (ref 1.005–1.030)

## 2016-07-08 LAB — COMPREHENSIVE METABOLIC PANEL
ALT: 26 U/L (ref 14–54)
ANION GAP: 11 (ref 5–15)
AST: 24 U/L (ref 15–41)
Albumin: 3.9 g/dL (ref 3.5–5.0)
Alkaline Phosphatase: 46 U/L (ref 38–126)
BUN: 34 mg/dL — AB (ref 6–20)
CHLORIDE: 94 mmol/L — AB (ref 101–111)
CO2: 37 mmol/L — ABNORMAL HIGH (ref 22–32)
Calcium: 9.8 mg/dL (ref 8.9–10.3)
Creatinine, Ser: 1.02 mg/dL — ABNORMAL HIGH (ref 0.44–1.00)
GFR calc Af Amer: >60 mL/min (ref >60)
GFR, EST NON AFRICAN AMERICAN: 57 mL/min — AB (ref >60)
Glucose, Bld: 141 mg/dL — ABNORMAL HIGH (ref 65–99)
POTASSIUM: 4.4 mmol/L (ref 3.5–5.1)
Sodium: 142 mmol/L (ref 135–145)
Total Bilirubin: 1 mg/dL (ref 0.3–1.2)
Total Protein: 7.3 g/dL (ref 6.5–8.1)

## 2016-07-10 LAB — URINE CULTURE: CULTURE: NO GROWTH

## 2017-02-24 ENCOUNTER — Other Ambulatory Visit: Payer: Self-pay | Admitting: Radiation Oncology

## 2017-05-02 ENCOUNTER — Emergency Department: Payer: Medicare Other

## 2017-05-02 ENCOUNTER — Emergency Department
Admission: EM | Admit: 2017-05-02 | Discharge: 2017-05-04 | Disposition: A | Discharge status: Skilled Nursing Facility | Payer: Medicare Other | Attending: Emergency Medicine | Admitting: Emergency Medicine

## 2017-05-02 ENCOUNTER — Encounter: Payer: Self-pay | Admitting: Emergency Medicine

## 2017-05-02 ENCOUNTER — Other Ambulatory Visit: Payer: Self-pay

## 2017-05-02 DIAGNOSIS — W19XXXA Unspecified fall, initial encounter: Secondary | ICD-10-CM

## 2017-05-02 DIAGNOSIS — E119 Type 2 diabetes mellitus without complications: Secondary | ICD-10-CM | POA: Insufficient documentation

## 2017-05-02 DIAGNOSIS — N3000 Acute cystitis without hematuria: Secondary | ICD-10-CM

## 2017-05-02 DIAGNOSIS — Y9389 Activity, other specified: Secondary | ICD-10-CM | POA: Insufficient documentation

## 2017-05-02 DIAGNOSIS — Z79899 Other long term (current) drug therapy: Secondary | ICD-10-CM | POA: Diagnosis not present

## 2017-05-02 DIAGNOSIS — M25512 Pain in left shoulder: Secondary | ICD-10-CM | POA: Diagnosis present

## 2017-05-02 DIAGNOSIS — W01198A Fall on same level from slipping, tripping and stumbling with subsequent striking against other object, initial encounter: Secondary | ICD-10-CM | POA: Diagnosis not present

## 2017-05-02 DIAGNOSIS — R531 Weakness: Secondary | ICD-10-CM | POA: Diagnosis not present

## 2017-05-02 DIAGNOSIS — Y929 Unspecified place or not applicable: Secondary | ICD-10-CM | POA: Diagnosis not present

## 2017-05-02 DIAGNOSIS — I10 Essential (primary) hypertension: Secondary | ICD-10-CM | POA: Diagnosis not present

## 2017-05-02 DIAGNOSIS — R6884 Jaw pain: Secondary | ICD-10-CM | POA: Diagnosis not present

## 2017-05-02 DIAGNOSIS — Y999 Unspecified external cause status: Secondary | ICD-10-CM | POA: Insufficient documentation

## 2017-05-02 DIAGNOSIS — Z794 Long term (current) use of insulin: Secondary | ICD-10-CM | POA: Diagnosis not present

## 2017-05-02 LAB — CBC WITH DIFFERENTIAL/PLATELET
BASOS ABS: 0.1 10*3/uL (ref 0–0.1)
BASOS PCT: 1 %
Eosinophils Absolute: 0.1 10*3/uL (ref 0–0.7)
Eosinophils Relative: 1 %
HEMATOCRIT: 37.1 % (ref 35.0–47.0)
Hemoglobin: 12.3 g/dL (ref 12.0–16.0)
Lymphocytes Relative: 9 %
Lymphs Abs: 1.1 10*3/uL (ref 1.0–3.6)
MCH: 31.1 pg (ref 26.0–34.0)
MCHC: 33.1 g/dL (ref 32.0–36.0)
MCV: 94 fL (ref 80.0–100.0)
MONO ABS: 0.8 10*3/uL (ref 0.2–0.9)
Monocytes Relative: 6 %
NEUTROS ABS: 11.3 10*3/uL — AB (ref 1.4–6.5)
NEUTROS PCT: 83 %
Platelets: 229 10*3/uL (ref 150–440)
RBC: 3.95 MIL/uL (ref 3.80–5.20)
RDW: 13.6 % (ref 11.5–14.5)
WBC: 13.5 10*3/uL — ABNORMAL HIGH (ref 3.6–11.0)

## 2017-05-02 LAB — TROPONIN I: Troponin I: 0.03 ng/mL (ref <0.03)

## 2017-05-02 LAB — CK: CK TOTAL: 119 U/L (ref 38–234)

## 2017-05-02 LAB — COMPREHENSIVE METABOLIC PANEL
ALT: 19 U/L (ref 14–54)
AST: 22 U/L (ref 15–41)
Albumin: 3.9 g/dL (ref 3.5–5.0)
Alkaline Phosphatase: 45 U/L (ref 38–126)
Anion gap: 16 — ABNORMAL HIGH (ref 5–15)
BILIRUBIN TOTAL: 0.8 mg/dL (ref 0.3–1.2)
BUN: 45 mg/dL — ABNORMAL HIGH (ref 6–20)
CHLORIDE: 94 mmol/L — AB (ref 101–111)
CO2: 32 mmol/L (ref 22–32)
Calcium: 9.5 mg/dL (ref 8.9–10.3)
Creatinine, Ser: 1.1 mg/dL — ABNORMAL HIGH (ref 0.44–1.00)
GFR, EST NON AFRICAN AMERICAN: 52 mL/min — AB (ref >60)
Glucose, Bld: 194 mg/dL — ABNORMAL HIGH (ref 65–99)
Potassium: 4.1 mmol/L (ref 3.5–5.1)
Sodium: 142 mmol/L (ref 135–145)
Total Protein: 7.9 g/dL (ref 6.5–8.1)

## 2017-05-02 LAB — GLUCOSE, CAPILLARY
GLUCOSE-CAPILLARY: 264 mg/dL — AB (ref 65–99)
Glucose-Capillary: 234 mg/dL — ABNORMAL HIGH (ref 65–99)

## 2017-05-02 MED ORDER — NABUMETONE 500 MG PO TABS
500.0000 mg | ORAL_TABLET | Freq: Every day | ORAL | Status: DC
  Administered 2017-05-03: 500 mg via ORAL
  Filled 2017-05-02 (×4): qty 1

## 2017-05-02 MED ORDER — FLUTICASONE PROPIONATE 50 MCG/ACT NA SUSP
1.0000 | NASAL | Status: DC | PRN
  Filled 2017-05-02: qty 16

## 2017-05-02 MED ORDER — VITAMIN B-12 1000 MCG PO TABS
1000.0000 ug | ORAL_TABLET | ORAL | Status: DC
  Administered 2017-05-03: 1000 ug via ORAL
  Filled 2017-05-02: qty 1

## 2017-05-02 MED ORDER — ETHACRYNIC ACID 25 MG PO TABS: 75.0000 mg | ORAL_TABLET | Freq: Two times a day (BID) | ORAL | Status: DC

## 2017-05-02 MED ORDER — BENZTROPINE MESYLATE 1 MG PO TABS
1.0000 mg | ORAL_TABLET | Freq: Two times a day (BID) | ORAL | Status: DC
  Administered 2017-05-02 – 2017-05-04 (×4): 1 mg via ORAL
  Filled 2017-05-02 (×4): qty 1

## 2017-05-02 MED ORDER — ETHACRYNIC ACID 25 MG PO TABS
75.0000 mg | ORAL_TABLET | Freq: Two times a day (BID) | ORAL | Status: DC
  Administered 2017-05-02 – 2017-05-04 (×4): 75 mg via ORAL
  Filled 2017-05-02 (×5): qty 3

## 2017-05-02 MED ORDER — VITAMIN D 1000 UNITS PO TABS
1000.0000 [IU] | ORAL_TABLET | Freq: Every day | ORAL | Status: DC
  Administered 2017-05-03 – 2017-05-04 (×2): 1000 [IU] via ORAL
  Filled 2017-05-02 (×2): qty 1

## 2017-05-02 MED ORDER — CANAGLIFLOZIN 100 MG PO TABS
100.0000 mg | ORAL_TABLET | Freq: Every day | ORAL | Status: DC
  Filled 2017-05-02: qty 1

## 2017-05-02 MED ORDER — INSULIN ASPART 100 UNIT/ML ~~LOC~~ SOLN
0.0000 [IU] | Freq: Three times a day (TID) | SUBCUTANEOUS | Status: DC
  Administered 2017-05-02: 3 [IU] via SUBCUTANEOUS
  Administered 2017-05-03: 2 [IU] via SUBCUTANEOUS
  Administered 2017-05-03: 3 [IU] via SUBCUTANEOUS
  Administered 2017-05-03: 5 [IU] via SUBCUTANEOUS
  Administered 2017-05-04: 3 [IU] via SUBCUTANEOUS
  Administered 2017-05-04: 5 [IU] via SUBCUTANEOUS
  Filled 2017-05-02 (×6): qty 1

## 2017-05-02 MED ORDER — NYSTATIN 100000 UNIT/GM EX POWD
Freq: Two times a day (BID) | CUTANEOUS | Status: DC
  Administered 2017-05-04: 11:00:00 via TOPICAL
  Filled 2017-05-02 (×2): qty 15

## 2017-05-02 MED ORDER — HYDROXYZINE HCL 25 MG PO TABS
25.0000 mg | ORAL_TABLET | Freq: Four times a day (QID) | ORAL | Status: DC | PRN
  Administered 2017-05-03: 25 mg via ORAL

## 2017-05-02 MED ORDER — INSULIN GLARGINE 100 UNIT/ML ~~LOC~~ SOLN
20.0000 [IU] | Freq: Every day | SUBCUTANEOUS | Status: DC
  Administered 2017-05-02 – 2017-05-04 (×3): 20 [IU] via SUBCUTANEOUS
  Filled 2017-05-02 (×4): qty 0.2

## 2017-05-02 MED ORDER — LORAZEPAM 0.5 MG PO TABS
0.5000 mg | ORAL_TABLET | Freq: Three times a day (TID) | ORAL | Status: DC | PRN
  Administered 2017-05-02: 0.5 mg via ORAL
  Filled 2017-05-02: qty 1

## 2017-05-02 MED ORDER — POLYETHYLENE GLYCOL 3350 17 G PO PACK
17.0000 g | PACK | Freq: Every day | ORAL | Status: DC
  Administered 2017-05-02 – 2017-05-04 (×3): 17 g via ORAL
  Filled 2017-05-02 (×3): qty 1

## 2017-05-02 MED ORDER — HYDRALAZINE HCL 50 MG PO TABS
25.0000 mg | ORAL_TABLET | Freq: Four times a day (QID) | ORAL | Status: DC
  Administered 2017-05-02 – 2017-05-04 (×6): 25 mg via ORAL
  Filled 2017-05-02 (×6): qty 1

## 2017-05-02 MED ORDER — INSULIN ASPART 100 UNIT/ML ~~LOC~~ SOLN
0.0000 [IU] | Freq: Every day | SUBCUTANEOUS | Status: DC
  Administered 2017-05-02 – 2017-05-03 (×2): 3 [IU] via SUBCUTANEOUS
  Filled 2017-05-02 (×3): qty 1

## 2017-05-02 NOTE — ED Provider Notes (Signed)
-----------------------------------------   4:25 PM on 05/02/2017 -----------------------------------------  Physical therapy never called back to speak with Dr. Cinda Quest about her evaluation today.  Apparently social work has worked on the placement and Dr. Cinda Quest has already signed the FL-2.  The only thing holding up placement is a physical therapy consult.  I have ordered home meds to the best my ability based on our formulary and I spoke in person with 1 of our pharmacist to come up with a reasonable insulin regimen.  Rehab placement is currently pending.   ----------------------------------------- 4:41 PM on 05/02/2017 -----------------------------------------  Physical therapist called me back and we discussed the case by phone.  His phone never arranging so therefore he did not know Dr. Cinda Quest wanted to speak with him, but he has already performed to the assessment in the emergency department and is recommending skilled nursing facility placement.  He is going to page social work to let her know that the assessment is complete and that his report is going in.   Hinda Kehr, MD 05/03/17 [phone]

## 2017-05-02 NOTE — ED Notes (Signed)
Pt placed into hospital bed by Raquel, RN and Lorriane Shire, RN

## 2017-05-02 NOTE — Evaluation (Signed)
Physical Therapy Evaluation Patient Details Name: Gabrielle [name] MRN: [id number] DOB: [birth year] Today's Date: 05/02/2017   History of Present Illness  Pt is a 67 y.o. female patient who reports she felt weak and lost her balance and fell over. She could not get back up again. She also says she got tangled up in her walker. She just got out of hospital [facility] for an admission for psychosis. She thinks she needs rehabilitation. She also complains of increased pain in her left shoulder since her fall. She reports she has a bad shoulder and pain there constantly but this is now worse made worse with movement. She does not have a headache does not have any other complaints.  Patient is not short of breath does not complain of shortness of breath and has no chest pain.  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   Current assessment includes: cellulitis, acute on chronic kidney failure, DM, AKI, hyperkalemia, and uncontrolled HTN.    Clinical Impression  Pt presents with deficits in strength, transfers, mobility, gait, balance, and activity tolerance.  Pt presented with significant deficits in functional strength and required +3 max to total assist with bed mobility tasks.  Pt able to sit at EOB with close SBA but was unable to attempt transfers at EOB secondary to fear of falling.  Prior to recent [facility] at [facility] pt was able to transfer independently from various surfaces in her home and to amb around 5' for functional mobility tasks with a SW.  Pt will benefit from PT services in a SNF setting upon discharge to safely address above deficits for decreased caregiver assistance and eventual return to her PLOF.      Follow Up Recommendations SNF;Supervision for mobility/OOB    Equipment Recommendations  None recommended by PT    Recommendations for Other Services       Precautions / Restrictions Precautions Precautions:  Fall Restrictions Weight Bearing Restrictions: No      Mobility  Bed Mobility Overal bed mobility: Needs Assistance Bed Mobility: Supine to Sit;Sit to Supine;Rolling Rolling: Total assist;Max assist   Supine to sit: Total assist;Max assist Sit to supine: Total assist;Max assist   General bed mobility comments: +3 total max to total assist for bed mobility tasks  Transfers                 General transfer comment: Pt unable to attempt to stand from elevated EOB in ER for fear of falling  Ambulation/Gait             General Gait Details: NT  Stairs            Wheelchair Mobility    Modified Rankin (Stroke Patients Only)       Balance Overall balance assessment: Needs assistance Sitting-balance support: Feet unsupported;No upper extremity supported Sitting balance-Leahy Scale: Fair                                       Pertinent Vitals/Pain Pain Assessment: No/denies pain    Home Living Family/patient expects to be discharged to:: Private residence Living Arrangements: Alone Available Help at Discharge: Family;Available PRN/intermittently;Personal care attendant(PCA 3x/wk) Type of Home: House Home Access: Ramped entrance     Home Layout: One level Home Equipment: Walker - standard;Wheelchair - power;Toilet riser;Grab bars - toilet Additional Comments: Sit to stand chair lift recliner  Prior Function Level of Independence: Needs assistance   Gait / Transfers Assistance Needed: Pt able to amb max of 5 feet with a standard walker for functional household tasks, Ind transfers to/from various elevated surfaces in the home  ADL's / Homemaking Assistance Needed: PCA assists with bathing, dressing, and errands        Hand Dominance        Extremity/Trunk Assessment   Upper Extremity Assessment Upper Extremity Assessment: Generalized weakness    Lower Extremity Assessment Lower Extremity Assessment: Generalized  weakness       Communication   Communication: No difficulties  Cognition Arousal/Alertness: Awake/alert Behavior During Therapy: WFL for tasks assessed/performed Overall Cognitive Status: Within Functional Limits for tasks assessed                                        General Comments      Exercises     Assessment/Plan    PT Assessment Patient needs continued PT services  PT Problem List Decreased strength;Decreased activity tolerance;Decreased balance;Decreased mobility       PT Treatment Interventions DME instruction;Gait training;Functional mobility training;Balance training;Therapeutic exercise;Therapeutic activities;Patient/family education    PT Goals (Current goals can be found in the Care Plan section)  Acute Rehab PT Goals Patient Stated Goal: To get stronger PT Goal Formulation: With patient Time For Goal Achievement: 05/15/17 Potential to Achieve Goals: Good    Frequency Min 2X/week   Barriers to discharge Decreased caregiver support      Co-evaluation               AM-PAC PT "6 Clicks" Daily Activity  Outcome Measure Difficulty turning over in bed (including adjusting bedclothes, sheets and blankets)?: Unable Difficulty moving from lying on back to sitting on the side of the bed? : Unable Difficulty sitting down on and standing up from a chair with arms (e.g., wheelchair, bedside commode, etc,.)?: Unable Help needed moving to and from a bed to chair (including a wheelchair)?: Total Help needed walking in hospital room?: Total Help needed climbing 3-5 steps with a railing? : Total 6 Click Score: 6    End of Session   Activity Tolerance: Patient tolerated treatment well Patient left: in bed;with call bell/phone within reach;with family/visitor present(ER bedrails up as pt was found) Nurse Communication: Mobility status PT Visit Diagnosis: Muscle weakness (generalized) (M62.81);Difficulty in walking, not elsewhere classified  (R26.2)    Time: 2633-3545 PT Time Calculation (min) (ACUTE ONLY): 34 min   Charges:   PT Evaluation $PT Eval Low Complexity: 1 Low     PT G Codes:        DRoyetta Asal PT, DPT 05/02/17, 4:53 PM

## 2017-05-02 NOTE — ED Notes (Signed)
RN to check CBG before dinner tray. Pt reports she usually has a insulin resistance sliding scale in which you double to insulin given if the pt is insulin resistant. Pt also reports she takes 50 units if Lantus twice a day and that was a change made by the endocrinologist last week.

## 2017-05-02 NOTE — ED Notes (Signed)
Patient friend, Collie Siad, left note with charge RN and stated "if you need me please call" [phone]

## 2017-05-02 NOTE — Clinical Social Work Note (Signed)
CSW received a consult for "patient lost her balance fell on her face today she couldn't get up. She couldn't roll over. She thinks she needs rehabilitation and I agree." CSW met with patient and caregiver at bedside. Patient agreeable to SNF placement for short term rehab. Preference is [facility]. CSW completed FL-2 and faxed out via Hub. Awaiting bed offer and insurance auth. CSW continuing to follow for transitions of care/discharge needs.   Oretha Ellis, Latanya Presser, [place] Social Worker-ED [phone]

## 2017-05-02 NOTE — ED Notes (Signed)
Patient complaining of left shoulder pain, worse with movement. States this pain is chronic but increased since fall. Patient also reports pain to jaw but able to move it freely.

## 2017-05-02 NOTE — ED Provider Notes (Signed)
[facility] Emergency Department Provider Note   ____________________________________________   First MD Initiated Contact with Patient 05/02/17 1101     (approximate)  I have reviewed the triage vital signs and the nursing notes.   HISTORY  Chief Complaint Fall    HPI Gabrielle [name] is a 67 y.o. female patient reports she felt weak and lost her balance and fell over. She could not get back up again. She also says she got tangled up in her walker. She just got out of hospital [facility] for an admission for psychosis. She thinks she needs rehabilitation. She also complains of increased pain in her left shoulder since her fall. She reports she has a bad shoulder and pain there constantly but this is now worse.shoulder pain is made worse with movement. She does not have a headache does not have any other complaints.patient is not short of breath does not complain of shortness of breath and has no chest pain.   Past Medical History:  Diagnosis Date  . Arthritis   . Chronic venous stasis dermatitis of both lower extremities   . Diabetes mellitus without complication ([place])   . Hypertension   . Morbid obesity ([place])   . Osteoarthritis   . Sleep apnea     Patient Active Problem List   Diagnosis Date Noted  . Cellulitis 04/29/2016  . Acute on chronic kidney failure ([place]) 10/02/2015  . Diabetes ([place]) 10/01/2015  . SOB (shortness of breath) 10/01/2015  . AKI (acute kidney injury) ([place]) 10/01/2015  . Hyperkalemia 07/21/2015  . Uncontrolled hypertension 07/21/2015    Past Surgical History:  Procedure Laterality Date  . NO PAST SURGERIES    . none      Prior to Admission medications   Medication Sig Start Date End Date Taking? Authorizing Provider  acetaminophen (TYLENOL) 500 MG tablet Take 500 mg by mouth 3 (three) times daily.   Yes [provider]  benztropine (COGENTIN) 1 MG tablet Take 1 tablet by mouth 2 (two) times daily. 04/26/17  Yes [provider]  Cholecalciferol (VITAMIN D3) 1000 units CAPS Take 1,000 Units by mouth daily.    Yes [provider]  clobetasol ointment (TEMOVATE) 0.93 % Apply 1 application topically 2 (two) times daily.   Yes [provider]  ethacrynic acid (EDECRIN) 25 MG tablet Take 3 tablets (75 mg total) by mouth 2 (two) times daily. 05/04/16  Yes Vaughan Basta, MD  fluticasone (FLONASE) 50 MCG/ACT nasal spray Place 1 spray into both nostrils as needed.    Yes [provider]  hydrALAZINE (APRESOLINE) 25 MG tablet Take 1 tablet by mouth 4 (four) times daily. 05/01/17  Yes [provider]  insulin aspart (NOVOLOG) 100 UNIT/ML injection Inject 5 Units into the skin 3 (three) times daily with meals. Patient taking differently: Inject 10 Units into the skin 3 (three) times daily with meals.  05/04/16  Yes Vaughan Basta, MD  insulin glargine (LANTUS) 100 UNIT/ML injection Inject 0.85 mLs (85 Units total) into the skin daily. Patient taking differently: Inject 50 Units into the skin.  05/04/16  Yes Vaughan Basta, MD  JARDIANCE 25 MG TABS tablet Take 1 tablet by mouth daily. 05/01/17  Yes [provider]  LORazepam (ATIVAN) 0.5 MG tablet Take 1 tablet (0.5 mg total) by mouth every 8 (eight) hours as needed for anxiety. 05/04/16  Yes Vaughan Basta, MD  nabumetone (RELAFEN) 500 MG tablet Take 1 tablet by mouth daily.  06/16/15  Yes [provider]  nystatin (NYSTATIN) powder Apply topically 2 (two) times daily. To neck   Yes [provider]  perphenazine (TRILAFON) 2 MG tablet Take 1 tablet by mouth 2 (two) times daily as needed. 04/26/17  Yes [provider]  polyethylene glycol (MIRALAX / GLYCOLAX) packet Take 17 g by mouth daily.   Yes [provider]  vitamin B-12 (CYANOCOBALAMIN) 1000 MCG tablet Take 1,000 mcg by mouth 3 (three) times a week.    Yes [provider]    Allergies Lasix  [furosemide]; Ultram [tramadol]; Erythromycin; and Tetracyclines & related  Family History  Problem Relation Age of Onset  . Diabetes Mellitus II Father   . CAD Father   . CAD Mother   . Hiatal hernia Mother     Social History Social History   Tobacco Use  . Smoking status: Never Smoker  . Smokeless tobacco: Never Used  Substance Use Topics  . Alcohol use: No  . Drug use: No    Review of Systems  Constitutional: No fever/chills Eyes: No visual changes. ENT: No sore throat. Cardiovascular: Denies chest pain. Respiratory: Denies shortness of breath. Gastrointestinal: No abdominal pain.  No nausea, no vomiting.  No diarrhea.  No constipation. Genitourinary: Negative for dysuria. Musculoskeletal: Negative for back pain. Skin: Negative for rash. Neurological: Negative for headaches, focal weakness  ____________________________________________   PHYSICAL EXAM:  VITAL SIGNS: ED Triage Vitals  Enc Vitals Group     BP 05/02/17 1056 (!) 142/57     Pulse Rate 05/02/17 1056 93     Resp 05/02/17 1056 16     Temp 05/02/17 1056 97.8 F (36.6 C)     Temp Source 05/02/17 1056 Oral     SpO2 05/02/17 1056 96 %     Weight 05/02/17 1057 (!) 390 lb (176.9 kg)     Height 05/02/17 1057 5\' 6"  (1.676 m)     Head Circumference --      [facility] Flow --      Pain Score 05/02/17 1056 4     Pain Loc --      Pain Edu? --      Excl. in [place]? --     Constitutional: Alert and oriented. Well appearing and in no acute distress. Eyes: Conjunctivae are normal. PER. EOMI. Head: Atraumatic.patient does have a skin cancer on the bridge of her nose. When she fell her glasses apparently abraded the skin cancer was bleeding. There is no sign of any laceration there though.she says she is to have that taken off next week. Nose: No congestion/rhinnorhea. Mouth/Throat: Mucous membranes are moist.  Oropharynx non-erythematous. Neck: No stridor.   Cardiovascular: Normal rate, regular rhythm. Grossly normal  heart sounds.  Good peripheral circulation. Respiratory: Normal respiratory effort.  No retractions. Lungs CTAB. Gastrointestinal: Soft and nontender. No distention. No abdominal bruits. No CVA tenderness. Musculoskeletal: No lower extremity tenderness nor edema.  No joint effusions. Neurologic:  Normal speech and language. No gross focal neurologic deficits are appreciated. No gait instability. Skin:  Skin is warm, dry and intact. No rash noted. Psychiatric: Mood and affect are normal. Speech and behavior are normal.  ____________________________________________   LABS (all labs ordered are listed, but only abnormal results are displayed)  Labs Reviewed  COMPREHENSIVE METABOLIC PANEL - Abnormal; Notable for the following components:      Result Value   Chloride 94 (*)    Glucose, Bld 194 (*)    BUN 45 (*)    Creatinine, Ser 1.10 (*)  GFR calc non Af Amer 52 (*)    Anion gap 16 (*)    All other components within normal limits  CBC WITH DIFFERENTIAL/PLATELET - Abnormal; Notable for the following components:   WBC 13.5 (*)    Neutro Abs 11.3 (*)    All other components within normal limits  TROPONIN I  CK  CBC WITH DIFFERENTIAL/PLATELET  URINALYSIS, COMPLETE (UACMP) WITH MICROSCOPIC  TROPONIN I   ____________________________________________  EKG  EKG read and interpreted by me shows normal sinus rhythm rate of 85 right axis no acute ST-T wave changes ____________________________________________  RADIOLOGY  ED MD interpretation:  CT of the head and neck read as no acute disease CT of the chest was reviewed by me read by radiology as low-grade compensated CHF shoulder was read as severe DJD reviewed by me and agree with both of those findings  Official radiology report(s): Ct Head Wo Contrast  Result Date: 05/02/2017 CLINICAL DATA:  Weakness with recent fall, initial encounter EXAM: CT HEAD WITHOUT CONTRAST CT CERVICAL SPINE WITHOUT CONTRAST TECHNIQUE: Multidetector CT  imaging of the head and cervical spine was performed following the standard protocol without intravenous contrast. Multiplanar CT image reconstructions of the cervical spine were also generated. COMPARISON:  None. FINDINGS: CT HEAD FINDINGS Brain: No evidence of acute infarction, hemorrhage, hydrocephalus, extra-axial collection or mass lesion/mass effect. Vascular: No hyperdense vessel or unexpected calcification. Skull: Normal. Negative for fracture or focal lesion. Sinuses/Orbits: No acute finding. Other: None. CT CERVICAL SPINE FINDINGS Alignment: Loss of the normal cervical lordosis is noted. This may be related to muscular spasm or patient positioning. Skull base and vertebrae: 7 cervical segments are well visualized. Disc space narrowing is noted from C4-T3 with associated osteophytic changes. No acute fracture or facet abnormality is noted. Multilevel facet hypertrophic changes are seen. Soft tissues and spinal canal: No prevertebral fluid or swelling. No visible canal hematoma. Upper chest: Within normal limits. Other: None IMPRESSION: CT of the head: No acute intracranial abnormality noted. CT of the cervical spine: Multilevel degenerative change without acute abnormality. Mild loss of the normal cervical lordosis is noted Electronically Signed   By: Inez Catalina M.D.   On: 05/02/2017 12:00   Ct Cervical Spine Wo Contrast  Result Date: 05/02/2017 CLINICAL DATA:  Weakness with recent fall, initial encounter EXAM: CT HEAD WITHOUT CONTRAST CT CERVICAL SPINE WITHOUT CONTRAST TECHNIQUE: Multidetector CT imaging of the head and cervical spine was performed following the standard protocol without intravenous contrast. Multiplanar CT image reconstructions of the cervical spine were also generated. COMPARISON:  None. FINDINGS: CT HEAD FINDINGS Brain: No evidence of acute infarction, hemorrhage, hydrocephalus, extra-axial collection or mass lesion/mass effect. Vascular: No hyperdense vessel or unexpected  calcification. Skull: Normal. Negative for fracture or focal lesion. Sinuses/Orbits: No acute finding. Other: None. CT CERVICAL SPINE FINDINGS Alignment: Loss of the normal cervical lordosis is noted. This may be related to muscular spasm or patient positioning. Skull base and vertebrae: 7 cervical segments are well visualized. Disc space narrowing is noted from C4-T3 with associated osteophytic changes. No acute fracture or facet abnormality is noted. Multilevel facet hypertrophic changes are seen. Soft tissues and spinal canal: No prevertebral fluid or swelling. No visible canal hematoma. Upper chest: Within normal limits. Other: None IMPRESSION: CT of the head: No acute intracranial abnormality noted. CT of the cervical spine: Multilevel degenerative change without acute abnormality. Mild loss of the normal cervical lordosis is noted Electronically Signed   By: Inez Catalina M.D.   On:  05/02/2017 12:00   Dg Chest Portable 1 View  Result Date: 05/02/2017 CLINICAL DATA:  Patient became weak and fell forward striking the face on the ground and was down for several hours. History of morbid obesity, hypertension. EXAM: PORTABLE CHEST 1 VIEW COMPARISON:  Chest x-ray of October 01, 2015 FINDINGS: The lungs are adequately inflated. There is no focal infiltrate. There is no pleural effusion. The cardiac silhouette is enlarged. The pulmonary vascularity is mildly prominent centrally but stable. The mediastinum is normal in width. The observed bony thorax is unremarkable. IMPRESSION: Low-grade compensated CHF. No alveolar pneumonia nor other acute cardiopulmonary abnormality. Electronically Signed   By: David  Martinique M.D.   On: 05/02/2017 12:25   Dg Shoulder Left  Result Date: 05/02/2017 CLINICAL DATA:  Patient fell forward striking her face on the ground and was down for several hours. The patient reports left shoulder pain. EXAM: LEFT SHOULDER - 2+ VIEW COMPARISON:  Limited views of the left shoulder from a study of  October 01, 2015 FINDINGS: The bones are subjectively adequately mineralized. There is marked narrowing of the glenohumeral joint. There is eburnation of the articular surfaces with some humeral head flattening. There is mild widening of the [facility] joint which is chronic. There are spurs arising from the articular margin of the left clavicle. There is no acute fracture or dislocation. IMPRESSION: Moderate to severe osteoarthritic change of the left glenohumeral joint. No acute fracture. Electronically Signed   By: David  Martinique M.D.   On: 05/02/2017 12:27    ____________________________________________   PROCEDURES  Procedure(s) performed:   Procedures  Critical Care performed:   ____________________________________________   INITIAL IMPRESSION / ASSESSMENT AND PLAN / ED COURSE  ----------------------------------------- 2:23 PM on 05/02/2017 -----------------------------------------  Patient is awake and alert and looks well sitting up in bed eating says she feels fine. She is requesting [place] to try to see if they can get her to [facility] acute rehabilitation. Social work is working on her problem. She looks stable just possibly too weak to take care of herself        ____________________________________________   FINAL CLINICAL IMPRESSION(S) / ED DIAGNOSES  Final diagnoses:  Fall, initial encounter  Weak     ED Discharge Orders    None       Note:  This document was prepared using Dragon voice recognition software and may include unintentional dictation errors.    Nena Polio, MD 05/02/17 [phone]

## 2017-05-02 NOTE — ED Triage Notes (Signed)
Patient from home via [facility]. Reports she got to weak to stand and fell forward, hitting her face on the ground. Reports she was laying on her stomach on the ground for several hours prior to EMS arrival. Patient reports she has been to weak for ADLs since coming home from the hospital last Thursday. States she would like rehab placement. Denies Loss of consciousness.

## 2017-05-02 NOTE — NC FL2 (Signed)
[place] LEVEL OF CARE SCREENING TOOL     IDENTIFICATION  Patient Name: Taytum Scheck Birthdate: October 16, 1950 Sex: female Admission Date (Current Location): 05/02/2017  [place] and [place] Number:  Engineering geologist and Address:  [facility], [address] [place], [place] [address]      Provider Number: [id number]  Attending Physician Name and Address:  Nena Polio, MD  Relative Name and Phone Number:  Alfonse Flavors [phone])    Current Level of Care: Hospital Recommended Level of Care: [place] Prior Approval Number:    Date Approved/Denied:   PASRR Number: [id number]  Discharge Plan: SNF    Current Diagnoses: Patient Active Problem List   Diagnosis Date Noted  . Cellulitis 04/29/2016  . Acute on chronic kidney failure ([place]) 10/02/2015  . Diabetes ([place]) 10/01/2015  . SOB (shortness of breath) 10/01/2015  . AKI (acute kidney injury) ([place]) 10/01/2015  . Hyperkalemia 07/21/2015  . Uncontrolled hypertension 07/21/2015    Orientation RESPIRATION BLADDER Height & Weight     Self, Time, Situation, Place  O2(2L [place]) Continent Weight: (!) 390 lb (176.9 kg) Height:  5\' 6"  (167.6 cm)  BEHAVIORAL SYMPTOMS/MOOD NEUROLOGICAL BOWEL NUTRITION STATUS      Continent Diet(Carb Modified diet; thin fluids)  AMBULATORY STATUS COMMUNICATION OF NEEDS Skin   Extensive Assist Verbally Normal                       Personal Care Assistance Level of Assistance  Bathing, Feeding, Dressing Bathing Assistance: Limited assistance Feeding assistance: Independent Dressing Assistance: Limited assistance     Functional Limitations Info  Sight, Hearing, Speech Sight Info: Adequate Hearing Info: Adequate Speech Info: Adequate    SPECIAL CARE FACTORS FREQUENCY  PT (By licensed PT)     PT Frequency: 5x              Contractures Contractures Info: Not present    Additional Factors  Info  Code Status, Allergies, Insulin Sliding Scale Code Status Info: Full Allergies Info: Lasix Furosemide, Ultram Tramadol, Erythromycin, Tetracyclines & Related   Insulin Sliding Scale Info: See Med List       Current Medications (05/02/2017):  This is the current hospital active medication list No current facility-administered medications for this encounter.    Current Outpatient Medications  Medication Sig Dispense Refill  . acetaminophen (TYLENOL) 500 MG tablet Take 500 mg by mouth 3 (three) times daily.    . benztropine (COGENTIN) 1 MG tablet Take 1 tablet by mouth 2 (two) times daily.    . Cholecalciferol (VITAMIN D3) 1000 units CAPS Take 1,000 Units by mouth 3 (three) times a week.     . clobetasol ointment (TEMOVATE) 7.85 % Apply 1 application topically 2 (two) times daily.    Marland Kitchen ethacrynic acid (EDECRIN) 25 MG tablet Take 3 tablets (75 mg total) by mouth 2 (two) times daily. 60 tablet 0  . fluticasone (FLONASE) 50 MCG/ACT nasal spray Place 1 spray into both nostrils as needed.     . hydrALAZINE (APRESOLINE) 25 MG tablet Take 1 tablet by mouth 4 (four) times daily.    . insulin aspart (NOVOLOG) 100 UNIT/ML injection Inject 5 Units into the skin 3 (three) times daily with meals. 10 mL 11  . insulin glargine (LANTUS) 100 UNIT/ML injection Inject 0.85 mLs (85 Units total) into the skin daily. 10 mL 11  . JARDIANCE 25 MG TABS tablet Take 1 tablet by mouth daily.    Marland Kitchen  LORazepam (ATIVAN) 0.5 MG tablet Take 1 tablet (0.5 mg total) by mouth every 8 (eight) hours as needed for anxiety. 30 tablet 0  . metolazone (ZAROXOLYN) 2.5 MG tablet Take 2.5 mg by mouth 2 (two) times a week. Patient takes on Monday and Thursday    . nabumetone (RELAFEN) 500 MG tablet Take 1 tablet by mouth daily.     Marland Kitchen nystatin (NYSTATIN) powder Apply topically 2 (two) times daily. To neck    . perphenazine (TRILAFON) 2 MG tablet Take 1 tablet by mouth 2 (two) times daily as needed.    . polyethylene glycol (MIRALAX /  GLYCOLAX) packet Take 17 g by mouth daily.    . vitamin B-12 (CYANOCOBALAMIN) 1000 MCG tablet Take 1,000 mcg by mouth 3 (three) times a week.        Discharge Medications: Please see discharge summary for a list of discharge medications.  Relevant Imaging Results:  Relevant Lab Results:   Additional Information SSN: [id number]  Truitt Merle, LCSW

## 2017-05-03 LAB — URINALYSIS, COMPLETE (UACMP) WITH MICROSCOPIC
BILIRUBIN URINE: NEGATIVE
HGB URINE DIPSTICK: NEGATIVE
KETONES UR: NEGATIVE mg/dL
Nitrite: POSITIVE — AB
PH: 5 (ref 5.0–8.0)
Protein, ur: NEGATIVE mg/dL
Specific Gravity, Urine: 1.011 (ref 1.005–1.030)

## 2017-05-03 LAB — GLUCOSE, CAPILLARY
Glucose-Capillary: 155 mg/dL — ABNORMAL HIGH (ref 65–99)
Glucose-Capillary: 202 mg/dL — ABNORMAL HIGH (ref 65–99)
Glucose-Capillary: 256 mg/dL — ABNORMAL HIGH (ref 65–99)
Glucose-Capillary: 260 mg/dL — ABNORMAL HIGH (ref 65–99)

## 2017-05-03 MED ORDER — HYDROXYZINE HCL 25 MG PO TABS
ORAL_TABLET | ORAL | Status: AC
  Filled 2017-05-03: qty 1

## 2017-05-03 MED ORDER — ALBUTEROL SULFATE (2.5 MG/3ML) 0.083% IN NEBU
5.0000 mg | INHALATION_SOLUTION | RESPIRATORY_TRACT | Status: DC | PRN
  Administered 2017-05-03: 5 mg via RESPIRATORY_TRACT
  Filled 2017-05-03: qty 6

## 2017-05-03 NOTE — Clinical Social Work Note (Addendum)
Patient has been declined by [facility], [place]. Patient has an offer from [facility] and 3 pending beds from [facility], [facility], and [facility]. CSW followed up with Broadus John at [facility], [place], at [facility], and [place] at [facility] All are reviewing referral for decision. CSW spoke with Suezanne Cheshire, admission at [facility], who stated they would need P/T eval to show patient can participate in therapy. CSW updated EDP Dr. Reita Cliche and asked that another P/T eval be placed since patient is now in a hospital beds. CSW provided update to patient. CSW explained that patient would need to chose from facilities offered today and facility would need to get insurance authorization. CSW explained if insurance authorization is denied, patient will have to return home with home health and her caregiver. Patient expressed understanding. Patient stated she will not go to [facility]. Patient's order of preference: 1. [facility], 2. [facility], 3. [facility].  4:25pm- CSW spoke with Broadus John at [facility] who stated they cannot make a bed offer. CSW followed up with Olivia Mackie at [facility] with her DON.   4:41pm- CSW received bed offer from [facility] [place]. Olivia Mackie requesting [facility] Medicare insurance auth. Will be ready to accept patient tomorrow 3/28, pending insurance auth. CSW continuing to follow for transitions of care/discharge needs.   Oretha Ellis, Latanya Presser, [place] Social Worker-ED [phone]

## 2017-05-03 NOTE — ED Notes (Signed)
Placed pillows on side bed for comfort ,patient tried her best to help much as possible.

## 2017-05-03 NOTE — ED Notes (Signed)
Solmon Ice, EDT assisting patient with bathing

## 2017-05-03 NOTE — ED Provider Notes (Signed)
Social work is working on Nurse, mental health.  Patient was seen by PT yesterday on the ER stretcher patient was unable to really cooperate with getting up from the bedside and cooperating.  Social worker today let me know that a placement facility is need patient to be able to cooperate and participate, and so I will place a repeat consult with PT now that patient has been in a hospital bed which seem to be the issue previously.   Lisa Roca, MD 05/03/17 1147

## 2017-05-03 NOTE — ED Notes (Signed)
Placed patient on bedpan patient able to help push herself up with the head of bed down.

## 2017-05-03 NOTE — ED Notes (Signed)
CSW in room speaking with patient about placement options

## 2017-05-03 NOTE — ED Notes (Signed)
Pt provided fresh water. Denies needs at this time.

## 2017-05-03 NOTE — ED Notes (Signed)
Pt given graham crackers and peanut butter per request. Water refilled. Denies further needs.

## 2017-05-03 NOTE — ED Notes (Addendum)
Patient is resting comfortably at this time with no signs of distress present. Equal, unlabored rise and fall of chest noted within normal rate. Will continue to monitor.   

## 2017-05-03 NOTE — ED Notes (Signed)
Patient had complete bed bath, bed changed with the help of Pam (tech) and nurse Remo Lipps and myself.

## 2017-05-03 NOTE — Progress Notes (Signed)
Physical Therapy Treatment Patient Details Name: Gabrielle [name] MRN: [id number] DOB: [birth year] Today's Date: 05/03/2017    History of Present Illness Pt is a 67 y.o. female patient who reports she felt weak and lost her balance and fell over. She could not get back up again. She also says she got tangled up in her walker. She just got out of hospital [facility] for an admission for psychosis. She thinks she needs rehabilitation. She also complains of increased pain in her left shoulder since her fall. She reports she has a bad shoulder and pain there constantly but this is now worse made worse with movement. She does not have a headache does not have any other complaints.  Patient is not short of breath does not complain of shortness of breath and has no chest pain.  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   Current assessment includes: cellulitis, acute on chronic kidney failure, DM, AKI, hyperkalemia, and uncontrolled HTN.    PT Comments    Pt presents with deficits in strength, transfers, mobility, gait, balance, and activity tolerance.  Pt presented with decreased assistance required with bed mobility tasks this session and seemed to benefit from new standard hospital bed and railing hand holds.  Pt even able to scoot up to [facility] with bed in slight trendelenburg position with use of BUEs and BLEs without physical assistance.  Pt was highly motivated to participate in transfer training and was very close to coming to standing but could not quite clear the EOB.  Pt reports feeling weaker than baseline but also endorses increased fear of falling with transfer attempts after recent fall at home.  Pt will benefit from PT services in a SNF setting upon discharge to safely address above deficits for decreased caregiver assistance and eventual return to PLOF.     Follow Up Recommendations  SNF;Supervision for mobility/OOB     Equipment  Recommendations  None recommended by PT    Recommendations for Other Services       Precautions / Restrictions Precautions Precautions: Fall Restrictions Weight Bearing Restrictions: No    Mobility  Bed Mobility Overal bed mobility: Needs Assistance Bed Mobility: Supine to Sit;Sit to Supine;Rolling Rolling: +2 for physical assistance;Max assist   Supine to sit: +2 for physical assistance;Mod assist Sit to supine: +2 for physical assistance;Max assist   General bed mobility comments: Improved participation from patient with correstponding decrease in caregiver assistance with pt now in standard hospital bed and able to better use handrails   Transfers Overall transfer level: Needs assistance Equipment used: Standard walker Transfers: Sit to/from Stand Sit to Stand: +2 physical assistance;Mod assist         General transfer comment: Pt with improved performance with sit to/from stand attempt from EOB this date with pt able to significantly unweight bottom from EOB but unable to clear EOB completely   Ambulation/Gait             General Gait Details: Unable/unsafe to attempt   Stairs            Wheelchair Mobility    Modified Rankin (Stroke Patients Only)       Balance Overall balance assessment: Needs assistance Sitting-balance support: No upper extremity supported;Feet supported Sitting balance-Leahy Scale: Good  Cognition Arousal/Alertness: Awake/alert Behavior During Therapy: WFL for tasks assessed/performed Overall Cognitive Status: Within Functional Limits for tasks assessed                                        Exercises Total Joint Exercises Ankle Circles/Pumps: AROM;Both;5 reps;10 reps Quad Sets: Strengthening;Both;5 reps;10 reps Gluteal Sets: Strengthening;Both;10 reps Long Arc Quad: AROM;Both;10 reps Knee Flexion: AROM;Both;10 reps Other Exercises Other  Exercises: HEP education for BLE APs, QS, and GS x 10 each 5-6x/day and LAQs x 10 when sitting at EOB with staff    General Comments        Pertinent Vitals/Pain Pain Assessment: No/denies pain    Home Living                      Prior Function            PT Goals (current goals can now be found in the care plan section) Progress towards PT goals: Progressing toward goals    Frequency    Min 2X/week      PT Plan Current plan remains appropriate    Co-evaluation              AM-PAC PT "6 Clicks" Daily Activity  Outcome Measure                   End of Session Equipment Utilized During Treatment: Gait belt;Oxygen Activity Tolerance: Patient tolerated treatment well Patient left: in bed;with call bell/phone within reach;with family/visitor present;with nursing/sitter in room;with bed alarm set Nurse Communication: Mobility status PT Visit Diagnosis: Muscle weakness (generalized) (M62.81);Difficulty in walking, not elsewhere classified (R26.2)     Time: 2993-7169 PT Time Calculation (min) (ACUTE ONLY): 24 min  Charges:  $Therapeutic Exercise: 8-22 mins $Therapeutic Activity: 8-22 mins                    G Codes:       DRoyetta Asal PT, DPT 05/03/17, 2:56 PM

## 2017-05-04 LAB — GLUCOSE, CAPILLARY
GLUCOSE-CAPILLARY: 236 mg/dL — AB (ref 65–99)
GLUCOSE-CAPILLARY: 269 mg/dL — AB (ref 65–99)

## 2017-05-04 MED ORDER — PERPHENAZINE 2 MG PO TABS
2.0000 mg | ORAL_TABLET | Freq: Two times a day (BID) | ORAL | Status: DC | PRN
  Filled 2017-05-04: qty 1

## 2017-05-04 MED ORDER — PERPHENAZINE 2 MG PO TABS
2.0000 mg | ORAL_TABLET | Freq: Two times a day (BID) | ORAL | Status: DC
  Administered 2017-05-04: 2 mg via ORAL
  Filled 2017-05-04 (×2): qty 1

## 2017-05-04 MED ORDER — CEPHALEXIN 500 MG PO CAPS
500.0000 mg | ORAL_CAPSULE | Freq: Two times a day (BID) | ORAL | Status: DC
  Administered 2017-05-04: 500 mg via ORAL
  Filled 2017-05-04: qty 1

## 2017-05-04 MED ORDER — CEPHALEXIN 500 MG PO CAPS: 500.0000 mg | ORAL_CAPSULE | Freq: Two times a day (BID) | ORAL | 0 refills | Status: DC

## 2017-05-04 NOTE — ED Provider Notes (Signed)
-----------------------------------------   6:40 AM on 05/04/2017 -----------------------------------------   Blood pressure 123/68, pulse (!) 103, temperature 97.8 F (36.6 C), temperature source Oral, resp. rate 19, height 5\' 6"  (1.676 m), weight (!) 181.3 kg (399 lb 11.1 oz), SpO2 95 %.  The patient had no acute events since last update.  Calm and cooperative at this time.  Disposition is pending clinical social work recommendations.     Paulette Blanch, MD 05/04/17 [phone]

## 2017-05-04 NOTE — ED Notes (Signed)
External catheter placed and patient cleaned and barrier cream applied

## 2017-05-04 NOTE — ED Provider Notes (Signed)
Keflex for UTI ordered    Lavonia Drafts, MD 05/04/17 [phone]

## 2017-05-04 NOTE — ED Notes (Signed)
Called EMS for transport to [facility]

## 2017-05-04 NOTE — Clinical Social Work Note (Addendum)
Patient to discharge to [facility] facility today. Patient aware and agreeable to discharge plan. Olivia Mackie at [facility] confirmed bed offer and insurance auth approval received. CSW confirmed with Lawerance Bach that patient does have a private room. Patient aware. CSW updated ED Dr. Corky Downs and RN Rush Landmark. RN Rush Landmark to call report to [phone] and ask for nurse for room 507P on the [facility]. CSW faxed AVS to Tabitha at [phone] and confirmed receipt. CSW signing off as no further Social Work intervention needed.   Oretha Ellis, Latanya Presser, [place] Social Worker-ED [phone]

## 2017-06-20 ENCOUNTER — Encounter: Payer: Self-pay | Admitting: Emergency Medicine

## 2017-06-20 ENCOUNTER — Other Ambulatory Visit: Payer: Self-pay

## 2017-06-20 ENCOUNTER — Emergency Department
Admission: EM | Admit: 2017-06-20 | Discharge: 2017-06-22 | Disposition: A | Discharge status: Skilled Nursing Facility | Payer: Medicare Other | Attending: Emergency Medicine | Admitting: Emergency Medicine

## 2017-06-20 DIAGNOSIS — Y929 Unspecified place or not applicable: Secondary | ICD-10-CM | POA: Diagnosis not present

## 2017-06-20 DIAGNOSIS — Z794 Long term (current) use of insulin: Secondary | ICD-10-CM | POA: Diagnosis not present

## 2017-06-20 DIAGNOSIS — Z79899 Other long term (current) drug therapy: Secondary | ICD-10-CM | POA: Diagnosis not present

## 2017-06-20 DIAGNOSIS — I10 Essential (primary) hypertension: Secondary | ICD-10-CM | POA: Diagnosis not present

## 2017-06-20 DIAGNOSIS — Y999 Unspecified external cause status: Secondary | ICD-10-CM | POA: Diagnosis not present

## 2017-06-20 DIAGNOSIS — R531 Weakness: Secondary | ICD-10-CM

## 2017-06-20 DIAGNOSIS — S81812A Laceration without foreign body, left lower leg, initial encounter: Secondary | ICD-10-CM | POA: Insufficient documentation

## 2017-06-20 DIAGNOSIS — E119 Type 2 diabetes mellitus without complications: Secondary | ICD-10-CM | POA: Diagnosis not present

## 2017-06-20 DIAGNOSIS — S8992XA Unspecified injury of left lower leg, initial encounter: Secondary | ICD-10-CM | POA: Diagnosis present

## 2017-06-20 DIAGNOSIS — Y939 Activity, unspecified: Secondary | ICD-10-CM | POA: Insufficient documentation

## 2017-06-20 DIAGNOSIS — X58XXXA Exposure to other specified factors, initial encounter: Secondary | ICD-10-CM | POA: Insufficient documentation

## 2017-06-20 LAB — BASIC METABOLIC PANEL
ANION GAP: 10 (ref 5–15)
BUN: 33 mg/dL — ABNORMAL HIGH (ref 6–20)
CO2: 40 mmol/L — AB (ref 22–32)
Calcium: 9.3 mg/dL (ref 8.9–10.3)
Chloride: 89 mmol/L — ABNORMAL LOW (ref 101–111)
Creatinine, Ser: 1.31 mg/dL — ABNORMAL HIGH (ref 0.44–1.00)
GFR calc Af Amer: 49 mL/min — ABNORMAL LOW (ref >60)
GFR, EST NON AFRICAN AMERICAN: 42 mL/min — AB (ref >60)
GLUCOSE: 230 mg/dL — AB (ref 65–99)
POTASSIUM: 4.3 mmol/L (ref 3.5–5.1)
Sodium: 139 mmol/L (ref 135–145)

## 2017-06-20 LAB — CBC
HEMATOCRIT: 35 % (ref 35.0–47.0)
HEMOGLOBIN: 11.7 g/dL — AB (ref 12.0–16.0)
MCH: 31.2 pg (ref 26.0–34.0)
MCHC: 33.5 g/dL (ref 32.0–36.0)
MCV: 93 fL (ref 80.0–100.0)
PLATELETS: 229 10*3/uL (ref 150–440)
RBC: 3.77 MIL/uL — AB (ref 3.80–5.20)
RDW: 13.5 % (ref 11.5–14.5)
WBC: 9.5 10*3/uL (ref 3.6–11.0)

## 2017-06-20 LAB — GLUCOSE, CAPILLARY
GLUCOSE-CAPILLARY: 183 mg/dL — AB (ref 65–99)
Glucose-Capillary: 157 mg/dL — ABNORMAL HIGH (ref 65–99)

## 2017-06-20 MED ORDER — DIPHENHYDRAMINE HCL 50 MG/ML IJ SOLN
25.0000 mg | Freq: Once | INTRAMUSCULAR | Status: AC
Start: 2017-06-20 — End: 2017-06-20
  Administered 2017-06-20: 25 mg via INTRAVENOUS
  Filled 2017-06-20: qty 1

## 2017-06-20 MED ORDER — LIDOCAINE HCL (PF) 1 % IJ SOLN
INTRAMUSCULAR | Status: AC
  Filled 2017-06-20: qty 5

## 2017-06-20 MED ORDER — LIDOCAINE HCL (PF) 1 % IJ SOLN
INTRAMUSCULAR | Status: AC
  Filled 2017-06-20: qty 20

## 2017-06-20 MED ORDER — AMOXICILLIN-POT CLAVULANATE 875-125 MG PO TABS
1.0000 | ORAL_TABLET | Freq: Once | ORAL | Status: AC
  Administered 2017-06-20: 1 via ORAL
  Filled 2017-06-20: qty 1

## 2017-06-20 MED ORDER — MORPHINE SULFATE (PF) 4 MG/ML IV SOLN
4.0000 mg | Freq: Once | INTRAVENOUS | Status: AC
  Administered 2017-06-20: 4 mg via INTRAVENOUS
  Filled 2017-06-20: qty 1

## 2017-06-20 MED ORDER — ONDANSETRON HCL 4 MG/2ML IJ SOLN
4.0000 mg | Freq: Four times a day (QID) | INTRAMUSCULAR | Status: AC | PRN
  Administered 2017-06-20: 4 mg via INTRAVENOUS

## 2017-06-20 MED ORDER — ONDANSETRON HCL 4 MG/2ML IJ SOLN
INTRAMUSCULAR | Status: AC
  Filled 2017-06-20: qty 2

## 2017-06-20 MED ORDER — OXYCODONE-ACETAMINOPHEN 5-325 MG PO TABS
2.0000 | ORAL_TABLET | Freq: Once | ORAL | Status: AC
  Administered 2017-06-20: 2 via ORAL
  Filled 2017-06-20: qty 2

## 2017-06-20 NOTE — NC FL2 (Signed)
[place] LEVEL OF CARE SCREENING TOOL     IDENTIFICATION  Patient Name: Gabrielle [name] Birthdate: [birth year] Sex: female Admission Date (Current Location): 06/20/2017  [facility] and [place] Number:  Engineering geologist and Address:         Provider Number: [id number]  Attending Physician Name and Address:  No att. providers found  Relative Name and Phone Number:  Alfonse Flavors [phone](T) [phone]) or Liliana, Dang [phone]     Current Level of Care: Hospital Recommended Level of Care: [place] Prior Approval Number:    Date Approved/Denied:   PASRR Number: [id number]  Discharge Plan: SNF    Current Diagnoses: Patient Active Problem List   Diagnosis Date Noted  . Cellulitis 04/29/2016  . Acute on chronic kidney failure ([place]) 10/02/2015  . Diabetes ([place]) 10/01/2015  . SOB (shortness of breath) 10/01/2015  . AKI (acute kidney injury) ([place]) 10/01/2015  . Hyperkalemia 07/21/2015  . Uncontrolled hypertension 07/21/2015    Orientation RESPIRATION BLADDER Height & Weight     Self, Time, Situation, Place  O2(2L) Continent Weight: (!) 395 lb (179.2 kg) Height:  5\' 6"  (167.6 cm)  BEHAVIORAL SYMPTOMS/MOOD NEUROLOGICAL BOWEL NUTRITION STATUS      Continent Diet(Carb modified)  AMBULATORY STATUS COMMUNICATION OF NEEDS Skin   Limited Assist Verbally Normal                       Personal Care Assistance Level of Assistance  Bathing, Feeding, Dressing Bathing Assistance: Limited assistance Feeding assistance: Independent Dressing Assistance: Limited assistance     Functional Limitations Info  Sight, Hearing, Speech Sight Info: Adequate Hearing Info: Adequate Speech Info: Adequate    SPECIAL CARE FACTORS FREQUENCY  PT (By licensed PT)     PT Frequency: 5x a week              Contractures Contractures Info: Not present    Additional Factors Info  Code Status, Allergies, Insulin Sliding Scale  Code Status Info: Full Code Allergies Info: LASIX FUROSEMIDE, ULTRAM TRAMADOL, ERYTHROMYCIN, PLASTICIZED BASE PLASTIBASE, TETRACYCLINES & RELATED            Current Medications (06/20/2017):  This is the current hospital active medication list Current Facility-Administered Medications  Medication Dose Route Frequency Provider Last Rate Last Dose  . lidocaine (PF) (XYLOCAINE) 1 % injection           . lidocaine (PF) (XYLOCAINE) 1 % injection            Current Outpatient Medications  Medication Sig Dispense Refill  . benztropine (COGENTIN) 1 MG tablet Take 1 tablet by mouth 2 (two) times daily.    . Cholecalciferol (VITAMIN D3) 1000 units CAPS Take 1,000 Units by mouth daily.     . clobetasol ointment (TEMOVATE) 9.39 % Apply 1 application topically 2 (two) times daily.    . fluticasone (FLONASE) 50 MCG/ACT nasal spray Place 1 spray into both nostrils as needed.     . hydrALAZINE (APRESOLINE) 25 MG tablet Take 1 tablet by mouth 4 (four) times daily.    . insulin aspart (NOVOLOG) 100 UNIT/ML injection Inject 5 Units into the skin 3 (three) times daily with meals. (Patient taking differently: Inject 10 Units into the skin 3 (three) times daily with meals. ) 10 mL 11  . insulin glargine (LANTUS) 100 UNIT/ML injection Inject 0.85 mLs (85 Units total) into the skin daily. (Patient taking differently: Inject 50 Units into the skin at bedtime. ) 10  mL 11  . JARDIANCE 25 MG TABS tablet Take 1 tablet by mouth daily.    . nabumetone (RELAFEN) 500 MG tablet Take 1 tablet by mouth daily.     . polyethylene glycol (MIRALAX / GLYCOLAX) packet Take 17 g by mouth daily.    . vitamin B-12 (CYANOCOBALAMIN) 1000 MCG tablet Take 1,000 mcg by mouth 3 (three) times a week.     Marland Kitchen acetaminophen (TYLENOL) 500 MG tablet Take 500 mg by mouth 3 (three) times daily.    . cephALEXin (KEFLEX) 500 MG capsule Take 1 capsule (500 mg total) by mouth 2 (two) times daily. (Patient not taking: Reported on 06/20/2017) 14 capsule 0   . ethacrynic acid (EDECRIN) 25 MG tablet Take 3 tablets (75 mg total) by mouth 2 (two) times daily. 60 tablet 0  . LORazepam (ATIVAN) 0.5 MG tablet Take 1 tablet (0.5 mg total) by mouth every 8 (eight) hours as needed for anxiety. (Patient not taking: Reported on 06/20/2017) 30 tablet 0  . perphenazine (TRILAFON) 2 MG tablet Take 1 tablet by mouth 2 (two) times daily as needed.       Discharge Medications: Please see discharge summary for a list of discharge medications.  Relevant Imaging Results:  Relevant Lab Results:   Additional Information SSN: [id number]  Anell Barr

## 2017-06-20 NOTE — Clinical Social Work Note (Signed)
Clinical Social Work Assessment  Patient Details  Name: Gabrielle Gabrielle [name] Gabrielle [name] MRN: [id number] Date of Birth: November 10, 1950  Date of referral:  06/20/17               Reason for consult:  Facility Placement                Permission sought to share information with:  Family Supports, Customer service manager Permission granted to share information::  Yes, Verbal Permission Granted  Name::     Gabrielle Gabrielle [name], Gabrielle [name] [phone]  [phone] or Gabrielle Gabrielle [name] Gabrielle [name] [phone]  [phone]   Agency::  SNF admissions  Relationship::     Contact Information:     Housing/Transportation Living arrangements for the past 2 months:  [place], [place] of Information:  Patient, Adult Children Patient Interpreter Needed:  None Criminal Activity/Legal Involvement Pertinent to Current Situation/Hospitalization:    Significant Relationships:  Adult Children Lives with:  Self Do you feel safe going back to the place where you live?  No Need for family participation in patient care:  No (Coment)  Care giving concerns:  Patient and family feel she needs to return back to SNF for rehab, and possibly transitioning to long term care.   Social Worker assessment / plan:  Patient is a 67 year old female who was alert and oriented x4.  Patient was just discharged from [facility] yesterday, and she went home, and then fell and cut her leg.  Patient feels she is still too weak to stay at home by herself.  Patient's son was at bedside and they would like to pursue SNF placement again.  CSW explained to patient and her son that because she has not have a wellness period of 60 days if she gets approved for SNF, patient will be in copay days right away.  Patient and family expressed understanding and would like CSW to pursue SNF placement for her.  CSW explained to patient and her family what the process is for applying for Medicaid and that it can take 45 days for approval.  CSW  informed patient and her family to contact DSS to begin Medicaid application process.  Employment status:  Retired, Disabled (Comment on whether or not currently receiving Disability) Insurance information:  Programmer, applications PT Recommendations:  [place] / Referral to community resources:  [place]  Patient/Family's Response to care:  Patient agreeable to returning back to SNF pending insurance approval.  Patient/Family's Understanding of and Emotional Response to Diagnosis, Current Treatment, and Prognosis:  Patient and family were hoping patient would be able to return home and stay, but they are aware that patient may have a new baseline and may need SNF long term.  Patient expressed some sadness, but understands the difficulty of her living at home alone again.  Emotional Assessment Appearance:  Appears stated age Attitude/Demeanor/Rapport:    Affect (typically observed):  Appropriate Orientation:  Oriented to Self, Oriented to Place, Oriented to Situation, Oriented to  Time Alcohol / Substance use:  Not Applicable Psych involvement (Current and /or in the community):  No (Comment)  Discharge Needs  Concerns to be addressed:  Lack of Support, Care Coordination Readmission within the last 30 days:  No Current discharge risk:  Chronically ill, Lack of support system, Lives alone Barriers to Discharge:  Continued Medical Work up, [employer]   Anell Barr 06/20/2017, 6:33 PM

## 2017-06-20 NOTE — ED Notes (Signed)
PT at bedside at this time 

## 2017-06-20 NOTE — ED Notes (Signed)
Pt given meal tray.

## 2017-06-20 NOTE — ED Notes (Signed)
Patient's wound dressed with xeroform and wrapped with kerlex. External catheter placed per patient request.
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
## 2017-06-20 NOTE — ED Notes (Signed)
Wound cleaned with saline and wet gauze applied to cover until closure.

## 2017-06-20 NOTE — Evaluation (Signed)
Physical Therapy Evaluation Patient Details Name: Gabrielle [name] MRN: [id number] DOB: April 08, 1950 Today's Date: 06/20/2017   History of Present Illness  67 y.o. female here after a fall at home.  She had just completed ~6 weeks at rehab and had returned home 5/13.  Son was assisting her into bed (he did need to assist with rising), her L knee buckled during pivot transfer and L lateral leg skin "burst" with large open wound (unable to recieve stitches).  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   Clinical Impression  Pt is very limited with what she is able to do today and both pt and PT did not feel comfortable doing much mobility until the L lower leg wound is more closed/managed.  She is eager to work with PT and motivated to go to rehab and is highly motivated to try to go back home "alone" discussed with family that it may be beneficial to look at other long term options or higher level/frequency of care.  She was able to participate with light supine exercises (and Pt educated on performing basic exercises that she is familiar with) ~10 minutes apart from PT exam.  Pt unquestionably unsafe to go home at this time and will need rehab/higher level of care.    Follow Up Recommendations SNF    Equipment Recommendations       Recommendations for Other Services       Precautions / Restrictions Precautions Precautions: Fall Restrictions Weight Bearing Restrictions: No Other Position/Activity Restrictions: pt with no structural WBing issues, would likely have a very difficult/painful time putting weight on L LE      Mobility  Bed Mobility               General bed mobility comments: deferred bed mobility secondary to pt being very weak, open wound on L and generally pt not being phyiscally ready to work with mobility yet  Transfers                    Ambulation/Gait                Stairs             Wheelchair Mobility    Modified Rankin (Stroke Patients Only)       Balance                                             Pertinent Vitals/Pain Pain Assessment: No/denies pain    Home Living Family/patient expects to be discharged to:: Skilled nursing facility Living Arrangements: Alone Available Help at Discharge: Personal care attendant;Available PRN/intermittently(aide MWF 3-4 hrs with cooking, cleaning, dressing, bathe) Type of Home: House Home Access: Ramped entrance     Home Layout: One level Home Equipment: Walker - standard;Wheelchair - power;Toilet riser;Grab bars - toilet(lift chair)      Prior Function Level of Independence: Needs assistance   Gait / Transfers Assistance Needed: Pt generally unable to walk more than 5 ft  ADL's / Homemaking Assistance Needed: PCA assists with bathing, dressing, and errands        Hand Dominance        Extremity/Trunk Assessment   Upper Extremity Assessment Upper Extremity Assessment: Generalized weakness;RUE deficits/detail;LUE deficits/detail RUE Deficits / Details: R shoulder elevation 2/5, elbow ext 3/5, flex 3+/5  LUE Deficits / Details: no L shld AROM elev, 2-/5 elbow ext, 3+/5 flexion    Lower Extremity Assessment Lower Extremity Assessment: LLE deficits/detail;RLE deficits/detail;Generalized weakness RLE Deficits / Details: no SLRs, 3-/5 hip ab/ad, 3/5 knee ext in available range (0~50) LLE Deficits / Details: large wound on lateral lower leg, very guarded with testing/activity.  Pt did have ankle DF and eversion despite wound.  Gentle testing grossly 2+/5 t/o L LE       Communication   Communication: No difficulties  Cognition Arousal/Alertness: Awake/alert Behavior During Therapy: WFL for tasks assessed/performed Overall Cognitive Status: Within Functional Limits for tasks assessed                                        General Comments      Exercises  General Exercises - Lower Extremity Ankle Circles/Pumps: AROM;5 reps;Both Heel Slides: AAROM;5 reps;Right(deferred much movement on L secondary to wound) Hip ABduction/ADduction: AAROM;5 reps;Both   Assessment/Plan    PT Assessment Patient needs continued PT services  PT Problem List Decreased strength;Decreased activity tolerance;Decreased balance;Decreased mobility       PT Treatment Interventions DME instruction;Gait training;Functional mobility training;Balance training;Therapeutic exercise;Therapeutic activities;Patient/family education    PT Goals (Current goals can be found in the Care Plan section)  Acute Rehab PT Goals Patient Stated Goal: Pt hoping to be able to eventually go home safely? PT Goal Formulation: With patient Time For Goal Achievement: 07/04/17 Potential to Achieve Goals: Fair    Frequency Min 2X/week   Barriers to discharge        Co-evaluation               AM-PAC PT "6 Clicks" Daily Activity  Outcome Measure Difficulty turning over in bed (including adjusting bedclothes, sheets and blankets)?: Unable Difficulty moving from lying on back to sitting on the side of the bed? : Unable Difficulty sitting down on and standing up from a chair with arms (e.g., wheelchair, bedside commode, etc,.)?: Unable Help needed moving to and from a bed to chair (including a wheelchair)?: Total Help needed walking in hospital room?: Total Help needed climbing 3-5 steps with a railing? : Total 6 Click Score: 6    End of Session Equipment Utilized During Treatment: Oxygen Activity Tolerance: (limited generally and secondary to L LE wound) Patient left: in bed;with call bell/phone within reach;with family/visitor present Nurse Communication: Mobility status PT Visit Diagnosis: Muscle weakness (generalized) (M62.81);Difficulty in walking, not elsewhere classified (R26.2)    Time: 1350-1435 PT Time Calculation (min) (ACUTE ONLY): 45 min   Charges:   PT  Evaluation $PT Eval Low Complexity: 1 Low PT Treatments $Therapeutic Exercise: 8-22 mins   PT G Codes:        Kreg Shropshire, DPT 06/20/2017, 3:47 PM

## 2017-06-20 NOTE — ED Triage Notes (Addendum)
Pt arrived from home via EMS with c/o laceration to her left lower leg. Bleeding controlled at this time. Pt states she fell trying to get into her bed and cut herself on the metal bed frame. Pt denies hitting her head.

## 2017-06-20 NOTE — ED Notes (Signed)
Pt sleeping at this time.

## 2017-06-20 NOTE — ED Notes (Addendum)
Pt son called out to RN requesting to speak with charge nurse. Pt son is upset because pt has "been here almost 12 hours and nothing has been done". Pt son states that pt was supposed to be getting antibiotics for her leg and that nothing has been done. I explained to the son that there are consults in for social work, PT, and a wound nurse because the doctor was unable to suture her leg. RN explained to pt son that there is no order for antibiotics from MD. Pt son states that he would like to speak to the charge nurse because he is "fixing to throw a fit".   Charge nurse Annie Main informed that pt son would like to speak with him about patients care.

## 2017-06-20 NOTE — Clinical Social Work Note (Addendum)
CSW received consult that patient needs to return to SNF.  CSW spoke to [facility] who said patient was just discharged yesterday due to insurance ending coverage.  CSW contacted bedside nurse to request PT order for insurance authorization, awaiting PT recommendations.  12:30pm  CSW spoke to patient's son Angelisa Winthrop [phone] and he would like patient to return back to SNF pending insurance auth and PT recommendations.  CSW explained to patient's son that PT will have to see patient and then insurance will have to be approved in order for her to return back to SNF.  Patient's son would like her to return to [facility], CSW explained that patient's days would not start over because she has to be out of hospital and out of SNF for 60 days for the days to start back over.  CSW informed patient's son that if insurance does not approve patient the only options are to either return back home with home health or pay privately for SNF placement.  Patient's son stated they can pay privately if they need to and would like to consider transitioning to long term care.  CSW was given permission to begin bed search and send clinicals to [facility].  CSW contacted [employer], and they will review patient's information and start insurance authorization again.  Jones Broom. [place], MSW, [place]  06/20/2017 11:52 AM

## 2017-06-20 NOTE — ED Notes (Signed)
Pt resting at this time.

## 2017-06-20 NOTE — ED Notes (Addendum)
Pt resting at this time. Pt pending social work consult

## 2017-06-20 NOTE — ED Notes (Signed)
RN in room, pt repositioned and given ice water to drink

## 2017-06-20 NOTE — ED Notes (Signed)
Social worker at bedside speaking with family

## 2017-06-20 NOTE — Consult Note (Addendum)
[place] Nurse wound consult note Reason for Consult: Large left LE skin tear (avulson). Lateral aspect of skin tear is rolled to cover as much of defect as possible using a moist and a dry cotton tipped applicator. Patient is here in the ED with her caregiver. Wound type:trauma, sustained during fall Pressure Injury POA: NA Measurement: 12cm x 16cm x 0.4cm Wound bed: red, wet Drainage (amount, consistency, odor) serosanguinous Periwound: bruising, macerated Dressing procedure/placement/frequency: I will treat the avulsion conservatively using double thickness of xeroform gauze (antimicrobial/astringent) topped with an ABD pad and secured with Kerlix roll gauze and change twice daily. A prophylactic sacral foam dressing is placed as a pressure injury prevention strategy. Pressure redistribution heel boots will provide lateral rotation correction and heel elevation. Consider Surgical Consult and/or Plastic Surgery to see if other treatments are indicated   [place] nursing team will not follow, but will remain available to this patient, the nursing and medical teams.  Please re-consult if needed. Thanks, Maudie Flakes, MSN, RN, [place], Arther Abbott  Pager# [phone]

## 2017-06-20 NOTE — ED Notes (Signed)
PT at bedside.

## 2017-06-20 NOTE — ED Notes (Addendum)
Wound care completed by this RN and Anda Kraft, RN. Sacral foam dressing applied. Suction container for external catheter changed out. Pt given cup of ice

## 2017-06-20 NOTE — ED Notes (Signed)
Pt resting at this time. Family at bedside

## 2017-06-20 NOTE — ED Notes (Signed)
Meal tray provided for patient

## 2017-06-21 LAB — GLUCOSE, CAPILLARY
GLUCOSE-CAPILLARY: 198 mg/dL — AB (ref 65–99)
Glucose-Capillary: 249 mg/dL — ABNORMAL HIGH (ref 65–99)
Glucose-Capillary: 232 mg/dL — ABNORMAL HIGH (ref 65–99)

## 2017-06-21 MED ORDER — VITAMIN B-12 1000 MCG PO TABS
1000.0000 ug | ORAL_TABLET | ORAL | Status: DC
  Administered 2017-06-21: 1000 ug via ORAL
  Filled 2017-06-21: qty 1

## 2017-06-21 MED ORDER — INSULIN GLARGINE 100 UNIT/ML ~~LOC~~ SOLN
50.0000 [IU] | Freq: Every day | SUBCUTANEOUS | Status: DC
  Administered 2017-06-22: 50 [IU] via SUBCUTANEOUS
  Filled 2017-06-21 (×2): qty 0.5

## 2017-06-21 MED ORDER — ENOXAPARIN SODIUM 300 MG/3ML IJ SOLN: 180.0000 mg | Freq: Once | INTRAMUSCULAR | Status: DC

## 2017-06-21 MED ORDER — CEPHALEXIN 500 MG PO CAPS
500.0000 mg | ORAL_CAPSULE | Freq: Three times a day (TID) | ORAL | Status: DC
  Administered 2017-06-21 – 2017-06-22 (×4): 500 mg via ORAL
  Filled 2017-06-21 (×4): qty 1

## 2017-06-21 MED ORDER — BENZTROPINE MESYLATE 1 MG PO TABS
1.0000 mg | ORAL_TABLET | Freq: Two times a day (BID) | ORAL | Status: DC
  Filled 2017-06-21 (×2): qty 1

## 2017-06-21 MED ORDER — ACETAMINOPHEN 500 MG PO TABS
500.0000 mg | ORAL_TABLET | Freq: Three times a day (TID) | ORAL | Status: DC
  Administered 2017-06-21 – 2017-06-22 (×4): 500 mg via ORAL
  Filled 2017-06-21 (×4): qty 1

## 2017-06-21 MED ORDER — NABUMETONE 500 MG PO TABS
500.0000 mg | ORAL_TABLET | Freq: Every day | ORAL | Status: DC
  Administered 2017-06-21 – 2017-06-22 (×2): 500 mg via ORAL
  Filled 2017-06-21 (×2): qty 1

## 2017-06-21 MED ORDER — ENOXAPARIN SODIUM 300 MG/3ML IJ SOLN: 1.0000 mg/kg | Freq: Once | INTRAMUSCULAR | Status: DC

## 2017-06-21 MED ORDER — CANAGLIFLOZIN 100 MG PO TABS: 100.0000 mg | ORAL_TABLET | Freq: Every day | ORAL | Status: DC

## 2017-06-21 MED ORDER — HALOPERIDOL LACTATE 5 MG/ML IJ SOLN: 5.0000 mg | Freq: Once | INTRAMUSCULAR | Status: DC

## 2017-06-21 MED ORDER — VITAMIN D 1000 UNITS PO TABS
1000.0000 [IU] | ORAL_TABLET | Freq: Every day | ORAL | Status: DC
  Administered 2017-06-21 – 2017-06-22 (×2): 1000 [IU] via ORAL
  Filled 2017-06-21 (×2): qty 1

## 2017-06-21 MED ORDER — HYDRALAZINE HCL 50 MG PO TABS
25.0000 mg | ORAL_TABLET | Freq: Four times a day (QID) | ORAL | Status: DC
  Administered 2017-06-21 – 2017-06-22 (×4): 25 mg via ORAL
  Filled 2017-06-21 (×4): qty 1

## 2017-06-21 MED ORDER — INSULIN ASPART 100 UNIT/ML ~~LOC~~ SOLN: 0.0000 [IU] | Freq: Every day | SUBCUTANEOUS | Status: DC

## 2017-06-21 MED ORDER — POLYETHYLENE GLYCOL 3350 17 G PO PACK
17.0000 g | PACK | Freq: Every day | ORAL | Status: DC
  Administered 2017-06-21 – 2017-06-22 (×2): 17 g via ORAL
  Filled 2017-06-21 (×2): qty 1

## 2017-06-21 MED ORDER — INSULIN ASPART 100 UNIT/ML ~~LOC~~ SOLN
0.0000 [IU] | Freq: Three times a day (TID) | SUBCUTANEOUS | Status: DC
  Administered 2017-06-21: 3 [IU] via SUBCUTANEOUS
  Filled 2017-06-21: qty 1

## 2017-06-21 MED ORDER — CLOBETASOL PROPIONATE 0.05 % EX OINT
1.0000 "application " | TOPICAL_OINTMENT | Freq: Two times a day (BID) | CUTANEOUS | Status: DC
  Administered 2017-06-22 (×2): 1 via TOPICAL
  Filled 2017-06-21: qty 15

## 2017-06-21 MED ORDER — ETHACRYNIC ACID 25 MG PO TABS
75.0000 mg | ORAL_TABLET | Freq: Two times a day (BID) | ORAL | Status: DC
  Administered 2017-06-21 – 2017-06-22 (×2): 75 mg via ORAL
  Filled 2017-06-21 (×3): qty 3

## 2017-06-21 MED ORDER — INSULIN ASPART 100 UNIT/ML ~~LOC~~ SOLN
10.0000 [IU] | Freq: Three times a day (TID) | SUBCUTANEOUS | Status: DC
  Administered 2017-06-21 – 2017-06-22 (×4): 10 [IU] via SUBCUTANEOUS
  Filled 2017-06-21 (×5): qty 1

## 2017-06-21 MED ORDER — OXYCODONE-ACETAMINOPHEN 5-325 MG PO TABS
1.0000 | ORAL_TABLET | Freq: Once | ORAL | Status: AC
  Administered 2017-06-21: 1 via ORAL
  Filled 2017-06-21: qty 1

## 2017-06-21 MED ORDER — ENOXAPARIN SODIUM 100 MG/ML ~~LOC~~ SOLN
180.0000 mg | Freq: Once | SUBCUTANEOUS | Status: AC
  Administered 2017-06-21: 180 mg via SUBCUTANEOUS
  Filled 2017-06-21: qty 2

## 2017-06-21 NOTE — Progress Notes (Signed)
LCSW called and left message for Olivia Mackie at [facility] to see if she will accept patient and start insurance auth.  [employer] LCSW [phone]

## 2017-06-21 NOTE — ED Notes (Signed)
Assisted pt with peri care after she was incontinent of urine. Pt talkative with no distress noted at this time. Provided for comfort and safety and will continue to assess.

## 2017-06-21 NOTE — ED Notes (Signed)
Pt placed on bed pan, was able to urinate, Pt washed and cream applied.

## 2017-06-21 NOTE — ED Provider Notes (Signed)
-----------------------------------------   4:58 AM on 06/21/2017 -----------------------------------------   Blood pressure 128/66, pulse (!) 107, temperature 98.3 F (36.8 C), temperature source Oral, resp. rate 18, height 5\' 6"  (1.676 m), weight (!) 179.2 kg (395 lb), SpO2 98 %.  Stable.  Pending PT/SW eval in AM.   Darel Hong, MD 06/21/17 [phone]

## 2017-06-21 NOTE — Progress Notes (Signed)
LCSW received a call from [place] at [facility] and they are not willing to take her back and will not provide a bed offer. Will assess patient needs

## 2017-06-21 NOTE — Progress Notes (Signed)
LCSW consulted with patient and EDP. This patient reports that she is not able to manage on her own and wants her leg looked at by Surgeon. This worker did mention patients wishes to EDP and and wound care has been ordered. This patient has been at [facility] since March 28th and utilized her insurance and will have to pay out of pocket. It was explained and she reports she has not applied for medicaid long term yet. LCSW as per patients request sent out patient fl2 and and CSW assessment to other facilities.  Patient is concerned she cant manage on her own and scared of repeat falls.Enis Slipper LCSW [phone]

## 2017-06-21 NOTE — ED Notes (Signed)
RN performing peri care when this tech entered the room. Pt wanted to be slid more to the right side of the bed. Pt could not be moved by this tech and RN. Pt given warm blanket. Immediately after leaving the room pt was calling HELLO, HELLO. Pt stated she needed her call bell (call bell was to pt left, but pt wanted call bell in her hand) and pt wanted head of bed raised and stopped tech when tech had raised the bed approx. 3 - 4 inches.

## 2017-06-21 NOTE — ED Notes (Signed)
RN to change dressing and pt and son have requested to talk to MD about surgery or about wound closure options. MD made aware.

## 2017-06-21 NOTE — ED Provider Notes (Signed)
I re-dressed the wound which was left unwrapped by wound nurse. I also started patient on prophylactic keflex to prevent infection. No signs of infection at this time. Patient complaining of pain after dressing was placed. One percocet ordered. SW working on rehab placement. Surgery has been consulted at the request of the family for possible eval for OR closure. Surgery recommended outpatient wound care and no surgical intervention.   Alfred Levins, [place], MD 06/21/17 [phone]

## 2017-06-22 LAB — GLUCOSE, CAPILLARY
Glucose-Capillary: 181 mg/dL — ABNORMAL HIGH (ref 65–99)
Glucose-Capillary: 222 mg/dL — ABNORMAL HIGH (ref 65–99)
Glucose-Capillary: 259 mg/dL — ABNORMAL HIGH (ref 65–99)

## 2017-06-22 MED ORDER — LORAZEPAM 2 MG/ML IJ SOLN
2.0000 mg | Freq: Once | INTRAMUSCULAR | Status: AC
  Administered 2017-06-22: 2 mg via INTRAVENOUS
  Filled 2017-06-22: qty 1

## 2017-06-22 MED ORDER — ACETAMINOPHEN 500 MG PO TABS
ORAL_TABLET | ORAL | Status: AC
  Filled 2017-06-22: qty 1

## 2017-06-22 MED ORDER — ENOXAPARIN SODIUM 40 MG/0.4ML ~~LOC~~ SOLN
40.0000 mg | SUBCUTANEOUS | Status: DC
  Administered 2017-06-22: 40 mg via SUBCUTANEOUS
  Filled 2017-06-22: qty 0.4

## 2017-06-22 NOTE — Progress Notes (Signed)
LCSW received a call from [facility] and they reported the son refused a potential bed offer. LCSW met with patient and she had her son on the phone and it was explained to them both that patient needs to be placed and cant remain in ED. Currently [place] [facility] declined patient  LCSW offered that patient does have choices as to where she would like to go but by refusing bed offers, she can  Return home with home health.   LCSW was asked to send her information out to [facility] in [place] to see about bed offers. LCSW agreed and will provide patient with her own list so she can call and find a SNF and work out  Astronomer)  [employer] LCSW [phone]

## 2017-06-22 NOTE — Progress Notes (Signed)
LCSW received a call from [place] at [facility] who has spoken to [facility]. Patient does require 02 and wound care and is patient is agreeable to go to [facility].  Consulted with [facility]- Patient does have coverage for her medications and extra SNF days as per [facility], patient was in agreance with this DC plan.  Consulted with EDRN and patient will need transport by EMS on bed offer is made awaiting final call back from [place] at [facility].  [employer] LCSW [phone]

## 2017-06-22 NOTE — Progress Notes (Signed)
Patient had stated to this worker she does not want to return to [facility], it was expressed that [facility] SNF has no beds and no other facilities have made bed offers. It was discussed that patient could return home with home health.  Pt admitted that she has been with [facility] and will call them. Patient requested I give her time to make aplan. LCSW will consult with EDP.  Cecil Vandyke LCSW

## 2017-06-22 NOTE — Progress Notes (Signed)
Call report [phone]  Room 41  [place] completed and made a bed offer. Patient is to discharge  [employer] LCSW [phone]

## 2017-06-22 NOTE — Progress Notes (Signed)
LCSW called Suanne Marker and requested her complete a medicaid LTC application-Shannon Mariea Clonts will be forwarded information.  [employer] LCSW [phone]

## 2017-06-22 NOTE — Progress Notes (Signed)
PT Cancellation Note  Patient Details Name: Gabrielle [name] MRN: [id number] DOB: January 26, 1951   Cancelled Treatment:    Reason Eval/Treat Not Completed: Patient declined PT services this date secondary to on phone trying to make arrangements for discharge disposition.  Per pt she may discharge from ED this date and still does not have a discharge plan in place.  Will attempt to see pt at a future date as medically appropriate.     Linus Salmons PT, DPT 06/22/17, 2:44 PM

## 2017-06-22 NOTE — ED Provider Notes (Signed)
-----------------------------------------   6:24 AM on 06/22/2017 -----------------------------------------   Blood pressure (!) 119/101, pulse 89, temperature 98 F (36.7 C), temperature source Oral, resp. rate 18, height 5\' 6"  (1.676 m), weight (!) 179.2 kg (395 lb), SpO2 98 %.  The patient had no acute events since last update.  Calm and cooperative at this time.  Disposition is pending SW.   Darel Hong, MD 06/22/17 [phone]

## 2017-06-22 NOTE — ED Notes (Signed)
RN in room, pt A & O at this time. Denies any needs.

## 2017-06-22 NOTE — ED Notes (Signed)
Ate breakfast.  All except 1/2 her orange.

## 2017-06-22 NOTE — ED Notes (Signed)
Pharmacy reconciled medication list. Pt transported to [facility] via EMS. Sister signed discharge papers.

## 2017-06-22 NOTE — ED Notes (Signed)
Pt's towels around her external urinary catheter were changed per Pt's request.

## 2017-06-22 NOTE — ED Notes (Signed)
Social worker at bedside talking with pt.

## 2017-06-22 NOTE — ED Notes (Signed)
Iesha Summerhill RN cleaned the Pt, relocated the external urinary catheter and changed the bedding on the Pt. Pt requested snacks and a cold drink.

## 2017-06-22 NOTE — Progress Notes (Signed)
Patient is now agreeable to go to [facility] .   Claiborne Billings from [facility] will connect with her Environmental health practitioner and will layout program and fees for patient. This worker was advised that patient is to be discharged home ( she has existing home health) Pt wants 35 hours of health care aid and it was explained in the ED we do not negotiate and that she will need to have her primary doctor at home,advocate for her personal needs. Patient was agreeable to work with [facility].  Patient will be transported either home or [facility].  Tayvien Kane BBandi LCSW

## 2017-06-22 NOTE — ED Notes (Signed)
Awaiting pt's food tray.

## 2019-01-16 ENCOUNTER — Ambulatory Visit (INDEPENDENT_AMBULATORY_CARE_PROVIDER_SITE_OTHER): Payer: Medicare Other | Admitting: Plastic Surgery

## 2019-01-16 ENCOUNTER — Other Ambulatory Visit: Payer: Self-pay

## 2019-01-16 VITALS — BP 154/84 | HR 93 | Temp 98.6°F | Ht 66.0 in | Wt 346.0 lb

## 2019-01-16 DIAGNOSIS — C44310 Basal cell carcinoma of skin of unspecified parts of face: Secondary | ICD-10-CM | POA: Diagnosis not present

## 2019-01-16 NOTE — Progress Notes (Signed)
Referring Provider Gabrielle Cuff, MD [place],  [place] [address]   CC: No chief complaint on file. Basal cell carcinoma the glabella  Gabrielle [name] is an 68 y.o. female.  HPI: Patient is presenting with a large basal cell the glabella.  She is referred by her Mohs surgeon Dr. Winifred [name].  She says this lesion has been present for the past 10 years.  It has grown significantly in size over the last year or so.  She had a hard time seeking care for it as she has been in a nursing home and has been bedbound for a year and a half.  She says she has a significant leg wound that opens up every time she undergoes any sort of activity. She has diabetes and the last hemoglobin A1c that I have on record is 8.7.  She is on continuous oxygen by nasal cannula and says her sats will drop to the 80s on room air.  She has not had a heart attack.  She also reports having kidney disease with the most recent creatinine over 2.  She is planning to undergo a Mohs excision and it sounds like multiple options were discussed in regards to the treatment of this mass.  Allergies  Allergen Reactions  . Lasix [Furosemide]     Numbness and tingling of the face   . Ultram [Tramadol] Other (See Comments)    Pt reports, "makes my head feel funny."  . Erythromycin Rash  . Plasticized Base [Plastibase] Rash  . Tetracyclines & Related Rash    Outpatient Encounter Medications as of 01/16/2019  Medication Sig  . acetaminophen (TYLENOL) 500 MG tablet Take 500 mg by mouth 3 (three) times daily.  . Cholecalciferol (VITAMIN D3) 1000 units CAPS Take 1,000 Units by mouth daily.   . clobetasol ointment (TEMOVATE) 6.04 % Apply 1 application topically 2 (two) times daily.  Marland Kitchen ethacrynic acid (EDECRIN) 25 MG tablet Take 3 tablets (75 mg total) by mouth 2 (two) times daily.  . fluticasone (FLONASE) 50 MCG/ACT nasal spray Place 1 spray into both nostrils as needed.   . hydrALAZINE (APRESOLINE) 25 MG tablet Take 1 tablet  by mouth 4 (four) times daily.  . insulin aspart (NOVOLOG) 100 UNIT/ML injection Inject 5 Units into the skin 3 (three) times daily with meals. (Patient taking differently: Inject 10 Units into the skin 3 (three) times daily with meals. )  . insulin glargine (LANTUS) 100 UNIT/ML injection Inject 0.85 mLs (85 Units total) into the skin daily.  Marland Kitchen JARDIANCE 25 MG TABS tablet Take 1 tablet by mouth daily.  Marland Kitchen LORazepam (ATIVAN) 0.5 MG tablet Take 1 tablet (0.5 mg total) by mouth every 8 (eight) hours as needed for anxiety.  . nabumetone (RELAFEN) 500 MG tablet Take 1 tablet by mouth daily.   Marland Kitchen nystatin (NYSTATIN) powder Apply 1 g topically as needed.  . polyethylene glycol (MIRALAX / GLYCOLAX) packet Take 17 g by mouth daily.  . vitamin B-12 (CYANOCOBALAMIN) 1000 MCG tablet Take 1,000 mcg by mouth daily.    No facility-administered encounter medications on file as of 01/16/2019.      Past Medical History:  Diagnosis Date  . Arthritis   . Chronic venous stasis dermatitis of both lower extremities   . Diabetes mellitus without complication ([place])   . Hypertension   . Morbid obesity ([place])   . Osteoarthritis   . Sleep apnea     Past Surgical History:  Procedure Laterality Date  . NO  PAST SURGERIES    . none      Family History  Problem Relation Age of Onset  . Diabetes Mellitus II Father   . CAD Father   . CAD Mother   . Hiatal hernia Mother     Social History   Social History Narrative  . Not on file     Review of Systems General: Denies fevers, chills, weight loss CV: Denies chest pain, shortness of breath, palpitations  Physical Exam Vitals with BMI 01/16/2019 06/22/2017 06/22/2017  Height 5\' 6"  - -  Weight 346 lbs - -  BMI 96.22 - -  Systolic 297 - 989  Diastolic 84 - 65  Pulse 93 98 -    General:  No acute distress,  Alert and oriented, Non-Toxic, Normal speech and affect HEENT: Normocephalic atraumatic.  Extraocular movements intact.  Vision seems normal.  Cranial  nerves grossly intact.  She has 2 to 3 cm diameter mass in the right glabella approaching the medial canthus.  Its exophytic and ulcerative.  It does not feel fixed to the bone deeply.  I do not see any obvious surrounding scars.  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  She has a very complex medical history with multiple comorbidities.  I am not sure it would be safe to take her to the operating room for this problem.  I had a very long discussion with the patient regarding the numerous options that she has.  She seems well aware of the severity of her current medical comorbidities.  She is also understandably distressed about the ulcerative mass out of her glabella.  I conveyed that any choice that she makes regarding treatment needs to be well-informed with careful consideration for the pros and cons.  My concern with a attempt to achieve complete negative surgical margins would involve resection of a portion of her eyelid.  Her Mohs surgeon was also concerned about deeper invasion of the mass and should bony resection be required for total surgical control it would obviously complicate both the excision and reconstructive plan.  In light of these issues I have asked her to seek consultation with a radiation oncologist prior to initiating any surgical treatment.  I do recommend finding a way to manage this that does not require her to undergo general anesthesia if possible.  Radiation may be sufficient treatment for this or potentially a palliative excision could be done followed by radiation for any remaining positive margins.  We will help set her up [facility] and make a decision from that point.  I have discussed this in detail with her Mohs surgeon and he is in agreement with this plan.  I spent over an hour with the patient discussing her case and coordinating her care.  Gabrielle [name] 01/16/2019, 3:16 PM

## 2019-02-25 ENCOUNTER — Encounter: Payer: Self-pay | Admitting: *Deleted

## 2019-02-27 NOTE — Progress Notes (Signed)
Histology and Location of Primary Skin Cancer:  02/24/2017    Gabrielle [name] presented with the following signs/symptoms: She had a lesion to her forehead which had been present for 10 years, but had grown in size significantly in the past year. She has /had difficulty getting care due to multiple medical conditions and homebound/ nursing home status.   Past/Anticipated interventions by patient's surgeon/dermatologist for current problematic lesion, if any:  12/25/18 Dr. Winifred Olive   Past skin cancers, if any: She denies.   1) Location/Histology/Intervention:   2) Location/Histology/Intervention:   3) Location/Histology/Intervention:   History of Blistering sunburns, if any: N/A  SAFETY ISSUES:  Prior radiation? No  Pacemaker/ICD? No  Possible current pregnancy? No  Is the patient on methotrexate? No  Current Complaints / other details:   01/16/2019 Dr. 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  She has a very complex medical history with multiple comorbidities.  I am not sure it would be safe to take her to the operating room for this problem.  I had a very long discussion with the patient regarding the numerous options that she has.  She seems well aware of the severity of her current medical comorbidities.  She is also understandably distressed about the ulcerative mass out of her glabella.  I conveyed that any choice that she makes regarding treatment needs to be well-informed with careful consideration for the pros and cons.  My concern with a attempt to achieve complete negative surgical margins would involve resection of a portion of her eyelid.  Her Mohs surgeon was also concerned about deeper invasion of the mass and should bony resection be required for total surgical control it would obviously complicate both the excision and reconstructive plan.  In light of these issues I have asked her to seek  consultation with a radiation oncologist prior to initiating any surgical treatment.  I do recommend finding a way to manage this that does not require her to undergo general anesthesia if possible.  Radiation may be sufficient treatment for this or potentially a palliative excision could be done followed by radiation for any remaining positive margins.  We will help set her up [facility] and make a decision from that point.  I have discussed this in detail with her Mohs surgeon and he is in agreement with this plan.  I spent over an hour with the patient discussing her case and coordinating her care.  [name] 01/16/2019

## 2019-03-04 ENCOUNTER — Encounter: Payer: Self-pay | Admitting: Radiation Oncology

## 2019-03-04 ENCOUNTER — Other Ambulatory Visit: Payer: Self-pay

## 2019-03-04 ENCOUNTER — Encounter: Payer: Self-pay | Admitting: *Deleted

## 2019-03-04 ENCOUNTER — Ambulatory Visit
Admission: RE | Admit: 2019-03-04 | Discharge: 2019-03-04 | Disposition: A | Discharge status: Home / Self Care | Payer: Medicare PPO | Source: Ambulatory Visit | Attending: Radiation Oncology | Admitting: Radiation Oncology

## 2019-03-04 DIAGNOSIS — C44311 Basal cell carcinoma of skin of nose: Secondary | ICD-10-CM

## 2019-03-04 DIAGNOSIS — C44319 Basal cell carcinoma of skin of other parts of face: Secondary | ICD-10-CM | POA: Insufficient documentation

## 2019-03-04 HISTORY — DX: Dependence on supplemental oxygen: Z99.81

## 2019-03-04 HISTORY — DX: Chronic kidney disease, unspecified: N18.9

## 2019-03-04 HISTORY — DX: Unspecified malignant neoplasm of skin, unspecified: C44.90

## 2019-03-04 HISTORY — DX: Allergy, unspecified, initial encounter: T78.40XA

## 2019-03-04 NOTE — Progress Notes (Signed)
Radiation Oncology         ([phone]) [phone] ________________________________  Initial outpatient Consultation by telephone as patient was unable to access MyChart video during pandemic precautions   Name: Gabrielle [name] MRN: [id number]  Date: 03/04/2019  DOB: 17-Sep-1950  NL:[id number], Gabrielle Norris, MD  [name], MD   REFERRING PHYSICIAN: [name], MD  DIAGNOSIS:    ICD-10-CM   1. Basal cell carcinoma of skin of nose  C44.311   2. Basal cell carcinoma of glabella  C44.319    Cancer Staging Basal cell carcinoma of glabella Staging form: Cutaneous Carcinoma of the Head and Neck, AJCC 8th Edition - Clinical: Stage III (cT3, cN0, cM0) - Signed by Eppie Gibson, MD on 03/04/2019   CHIEF COMPLAINT: Here to discuss management of skin cancer  HISTORY OF PRESENT ILLNESS::Gabrielle [name] is a 69 y.o. female who presented with a lesion of the glabella which had been present for 12 years, but had grown in size significantly in the past year. She has /had difficulty getting care due to multiple medical conditions and bedbound/ nursing home status.  Biopsy showed basal cell carcinoma, superficial, nodular, and micronodular patterns, extending to the edge and base.  This biopsy was done a year ago.  She has been seen by Dr. Winifred Olive and Dr. Claudia Desanctis who have reservations regarding her tolerance of surgery given her other comorbidities and anesthesia risks.  Also, surgery is unlikely to establish negative margins and it is likely that a portion of her left eyelid would need to be removed.  Past skin cancers, if any: She denies.   History of Blistering sunburns, if any: N/A  SAFETY ISSUES:  Prior radiation? No  Pacemaker/ICD? No  Possible current pregnancy? No Is the patient on methotrexate? No.  PREVIOUS RADIATION THERAPY: No  PAST MEDICAL HISTORY:  has a past medical history of Allergy, Arthritis, Chronic kidney disease, Chronic venous stasis dermatitis of both lower extremities, Diabetes  mellitus without complication ([place]), Hypertension, Morbid obesity ([place]), Osteoarthritis, Oxygen dependent, Skin cancer, and Sleep apnea.    PAST SURGICAL HISTORY: Past Surgical History:  Procedure Laterality Date  . NO PAST SURGERIES    . none      FAMILY HISTORY: family history includes CAD in her father and mother; Diabetes Mellitus II in her father; Hiatal hernia in her mother.  SOCIAL HISTORY:  reports that she has never smoked. She has never used smokeless tobacco. She reports that she does not drink alcohol or use drugs.  ALLERGIES: Lasix [furosemide], Ultram [tramadol], Erythromycin, Plasticized base [plastibase], and Tetracyclines & related  MEDICATIONS:  Current Outpatient Medications  Medication Sig Dispense Refill  . acetaminophen (TYLENOL) 500 MG tablet Take 500 mg by mouth 3 (three) times daily.    Marland Kitchen aspirin 325 MG tablet Take 325 mg by mouth daily.    . benztropine (COGENTIN) 1 MG tablet Take 1 mg by mouth 2 (two) times daily.     . busPIRone (BUSPAR) 7.5 MG tablet     . Cholecalciferol (VITAMIN D3) 1000 units CAPS Take 1,000 Units by mouth daily.     . clobetasol ointment (TEMOVATE) 1.93 % Apply 1 application topically 2 (two) times daily.    Marland Kitchen docusate sodium (COLACE) 100 MG capsule Take 100 mg by mouth 2 (two) times daily.    . Dulaglutide (TRULICITY [place]) Inject 2 mg into the skin. Every Wednesday    . ethacrynic acid (EDECRIN) 25 MG tablet Take 3 tablets (75 mg total) by mouth 2 (two) times daily.  60 tablet 0  . fluticasone (FLONASE) 50 MCG/ACT nasal spray Place 1 spray into both nostrils as needed.     Marland Kitchen HUMALOG 100 UNIT/ML cartridge 5 Units. 5 units with every meal plus sliding scale coverage    . hydrALAZINE (APRESOLINE) 25 MG tablet Take 1 tablet by mouth 4 (four) times daily.    . insulin glargine (LANTUS) 100 UNIT/ML injection Inject 0.85 mLs (85 Units total) into the skin daily. (Patient taking differently: Inject 43 Units into the skin daily. 43 units in am and  pm) 10 mL 11  . JARDIANCE 25 MG TABS tablet Take 1 tablet by mouth daily.    . Melatonin 1 MG TABS Take 3 mg by mouth. At bedtime    . nabumetone (RELAFEN) 500 MG tablet Take 1 tablet by mouth daily.     Marland Kitchen nystatin (NYSTATIN) powder Apply 1 g topically as needed.    Marland Kitchen perphenazine (TRILAFON) 2 MG tablet     . polyethylene glycol (MIRALAX / GLYCOLAX) packet Take 17 g by mouth daily.    . vitamin B-12 (CYANOCOBALAMIN) 1000 MCG tablet Take 1,000 mcg by mouth daily.      No current facility-administered medications for this encounter.    REVIEW OF SYSTEMS:  Notable for that above.   PHYSICAL EXAM:  vitals were not taken for this visit.   photo sent by patient:     ECOG = 4  0 - Asymptomatic (Fully active, able to carry on all predisease activities without restriction)  1 - Symptomatic but completely ambulatory (Restricted in physically strenuous activity but ambulatory and able to carry out work of a light or sedentary nature. For example, light housework, office work)  2 - Symptomatic, <50% in bed during the day (Ambulatory and capable of all self care but unable to carry out any work activities. Up and about more than 50% of waking hours)  3 - Symptomatic, >50% in bed, but not bedbound (Capable of only limited self-care, confined to bed or chair 50% or more of waking hours)  4 - Bedbound (Completely disabled. Cannot carry on any self-care. Totally confined to bed or chair)  5 - Death   Eustace Pen MM, Creech RH, Tormey DC, et al. [phone]). "Toxicity and response criteria of the [facility]". [place] Oncol. 5 (6): 649-55   LABORATORY DATA:  Lab Results  Component Value Date   WBC 9.5 06/20/2017   HGB 11.7 (L) 06/20/2017   HCT 35.0 06/20/2017   MCV 93.0 06/20/2017   PLT 229 06/20/2017   CMP     Component Value Date/Time   NA 139 06/20/2017 0259   K 4.3 06/20/2017 0259   CL 89 (L) 06/20/2017 0259   CO2 40 (H) 06/20/2017 0259   GLUCOSE 230 (H)  06/20/2017 0259   BUN 33 (H) 06/20/2017 0259   CREATININE 1.31 (H) 06/20/2017 0259   CALCIUM 9.3 06/20/2017 0259   PROT 7.9 05/02/2017 1100   ALBUMIN 3.9 05/02/2017 1100   AST 22 05/02/2017 1100   ALT 19 05/02/2017 1100   ALKPHOS 45 05/02/2017 1100   BILITOT 0.8 05/02/2017 1100   GFRNONAA 42 (L) 06/20/2017 0259   GFRAA 49 (L) 06/20/2017 0259        RADIOGRAPHY: No results found.    IMPRESSION/PLAN:  Today, I talked to the patient about the findings and work-up thus far. We discussed the patient's diagnosis of advanced basal cell carcinoma of the glabella and general treatment for this, highlighting the  role of radiotherapy in the management. We discussed the available radiation techniques, and focused on the details of logistics and delivery.    We discussed the risks, benefits, and side effects of radiotherapy. Side effects may include but not necessarily be limited to: Hair loss of the eyebrow or eyelashes, skin irritation, fatigue, injury to tissues in the irradiated field, nonhealing ulcer at tumor site; no guarantees of treatment were given. The patient was encouraged to ask questions that I answered to the best of my ability.   Given her medical issues and risks of surgery, I think it's very reasonable to try 4-6 weeks of radiotherapy (I'd prefer 6 wks at standard fractionation if we see cartilage invasion on her CT planning images, but I know that transportation will be a struggle for her; 4 weeks of hypofractionated RT will be needed at minimum).  I told her that RT has a good chance of cure and there is a modest chance that she will has a nonhealing ulcer after treatment that may require skin graft of special wound care; most patients heal on their own.  I have sent a message to Dr. Claudia Desanctis and Dr. Winifred Olive to see if they have strong feelings about other treatment options instead of RT.  I think we are all on the same page about RT being the best fit for her circumstances, but want  to make sure.  Addendum: Dr. Claudia Desanctis and Dr. Winifred Olive are in agreement that radiotherapy (given definitively) is the best option for this patient.  I have ordered CT simulation to take place in the near future.  This encounter was provided by telemedicine platform by telephone as patient was unable to access MyChart video during pandemic precautions The patient has given verbal consent for this type of encounter and has been advised to only accept a meeting of this type in a secure network environment. The time spent during this encounter in total, on date of service, was 65 minutes. The attendants for this meeting include Eppie Gibson  and Hollace Kinnier.  During the encounter, Eppie Gibson was located at [facility] Radiation Oncology Department.  Shilynn Hoch was located at home.     __________________________________________   Eppie Gibson, MD

## 2019-03-06 ENCOUNTER — Ambulatory Visit
Admission: RE | Admit: 2019-03-06 | Discharge: 2019-03-06 | Disposition: A | Discharge status: Home / Self Care | Payer: Medicare PPO | Source: Ambulatory Visit | Attending: Radiation Oncology | Admitting: Radiation Oncology

## 2019-03-06 DIAGNOSIS — C44319 Basal cell carcinoma of skin of other parts of face: Secondary | ICD-10-CM | POA: Diagnosis present

## 2019-03-06 DIAGNOSIS — Z510 Encounter for antineoplastic radiation therapy: Secondary | ICD-10-CM | POA: Diagnosis not present

## 2019-03-06 NOTE — Progress Notes (Signed)
Head and Neck Cancer Simulation, IMRT treatment planning note   Outpatient  Diagnosis:    ICD-10-CM   1. Basal cell carcinoma of glabella  C44.319     The patient was taken to the CT simulator and identity was confirmed.  All relevant records and images related to the planned course of therapy were reviewed.  The patient freely provided informed written consent to proceed with treatment after reviewing the details related to the planned course of therapy. The consent form was witnessed and verified by the simulation staff.    The patient was laid in the supine position on the table. An Aquaplast head and shoulder mask was custom fitted to the patient's anatomy. Bolus was placed on her tumor. High-resolution CT axial imaging was obtained of the head and neck with contrast. I verified that the quality of the imaging is good for treatment planning. 1 Medically Necessary Treatment Device was fabricated and supervised by me: face mask.  Treatment planning note I plan to treat the patient with IMRT. I plan to treat the patient's tumor. I plan to treat to a total dose of 50 Gray in 20  fractions. Dose calculation was ordered from dosimetry.  IMRT planning Note  IMRT is medically necessary and an important modality to deliver adequate dose to the patient's at risk tissues while sparing the patient's normal structures, including the: R lens, L lens, L eye, R eye, target.  This justifies the use of IMRT in the patient's treatment.    -----------------------------------  Eppie Gibson, MD

## 2019-03-08 ENCOUNTER — Telehealth: Payer: Self-pay | Admitting: *Deleted

## 2019-03-08 NOTE — Progress Notes (Signed)
Oncology Nurse Navigator Documentation  Met with Ms. Gabrielle [name] during Telephone consult with Dr. Isidore Moos to discuss RT treatment for [facility] of glabella.  Introduced myself, explained my role as a member of her Care Team, provided my contact information.  She stated:  She is home-bound, is using non-medical transportation for appts but cannot afford for upcoming RT appts.  HH PT is pending.  She voiced understanding of plan for 4 weeks M-F XRT, CT SIM to be scheduled later this week.  I encouraged her to call me with needs/concerns moving forward.  Navigator Interventions  Identification/coordination of less costly transportation.  Gayleen Orem, RN, BSN Head & Neck Oncology Nurse [place] at [place] [phone]

## 2019-03-08 NOTE — Telephone Encounter (Addendum)
Oncology Nurse Navigator Documentation  Spoke with Ms. Gabrielle [name].  She indicated she will need medical transportation for RT appts.  She is currently arranging but would like information on more cost effective services as she is currently paying out-of-pocket.  I explained there are limited inexpensive services, suggested she investigate coverage through her insurance.  She voiced understanding.  Gayleen Orem, RN, BSN Head & Neck Oncology Nurse [place] at [place] [phone]

## 2019-03-11 ENCOUNTER — Ambulatory Visit: Payer: Medicare PPO | Admitting: Radiation Oncology

## 2019-03-12 ENCOUNTER — Ambulatory Visit: Payer: Medicare PPO | Admitting: Radiation Oncology

## 2019-03-12 DIAGNOSIS — Z510 Encounter for antineoplastic radiation therapy: Secondary | ICD-10-CM | POA: Insufficient documentation

## 2019-03-12 DIAGNOSIS — C44319 Basal cell carcinoma of skin of other parts of face: Secondary | ICD-10-CM | POA: Insufficient documentation

## 2019-03-13 ENCOUNTER — Ambulatory Visit
Admission: RE | Admit: 2019-03-13 | Discharge: 2019-03-13 | Disposition: A | Discharge status: Home / Self Care | Payer: Medicare PPO | Source: Ambulatory Visit | Attending: Radiation Oncology | Admitting: Radiation Oncology

## 2019-03-13 DIAGNOSIS — Z510 Encounter for antineoplastic radiation therapy: Secondary | ICD-10-CM | POA: Diagnosis not present

## 2019-03-13 LAB — SURGICAL PATHOLOGY

## 2019-03-14 ENCOUNTER — Ambulatory Visit: Payer: Medicare PPO

## 2019-03-15 ENCOUNTER — Ambulatory Visit
Admission: RE | Admit: 2019-03-15 | Discharge: 2019-03-15 | Disposition: A | Discharge status: Home / Self Care | Payer: Medicare PPO | Source: Ambulatory Visit | Attending: Radiation Oncology | Admitting: Radiation Oncology

## 2019-03-15 DIAGNOSIS — Z510 Encounter for antineoplastic radiation therapy: Secondary | ICD-10-CM | POA: Diagnosis not present

## 2019-03-18 ENCOUNTER — Ambulatory Visit
Admission: RE | Admit: 2019-03-18 | Discharge: 2019-03-18 | Disposition: A | Discharge status: Home / Self Care | Payer: Medicare PPO | Source: Ambulatory Visit | Attending: Radiation Oncology | Admitting: Radiation Oncology

## 2019-03-18 DIAGNOSIS — Z510 Encounter for antineoplastic radiation therapy: Secondary | ICD-10-CM | POA: Diagnosis not present

## 2019-03-19 ENCOUNTER — Ambulatory Visit
Admission: RE | Admit: 2019-03-19 | Discharge: 2019-03-19 | Disposition: A | Discharge status: Home / Self Care | Payer: Medicare PPO | Source: Ambulatory Visit | Attending: Radiation Oncology | Admitting: Radiation Oncology

## 2019-03-19 DIAGNOSIS — Z510 Encounter for antineoplastic radiation therapy: Secondary | ICD-10-CM | POA: Diagnosis not present

## 2019-03-20 ENCOUNTER — Ambulatory Visit
Admission: RE | Admit: 2019-03-20 | Discharge: 2019-03-20 | Disposition: A | Discharge status: Home / Self Care | Payer: Medicare PPO | Source: Ambulatory Visit | Attending: Radiation Oncology | Admitting: Radiation Oncology

## 2019-03-20 DIAGNOSIS — Z510 Encounter for antineoplastic radiation therapy: Secondary | ICD-10-CM | POA: Diagnosis not present

## 2019-03-21 ENCOUNTER — Ambulatory Visit
Admission: RE | Admit: 2019-03-21 | Discharge: 2019-03-21 | Disposition: A | Discharge status: Home / Self Care | Payer: Medicare PPO | Source: Ambulatory Visit | Attending: Radiation Oncology | Admitting: Radiation Oncology

## 2019-03-21 DIAGNOSIS — Z510 Encounter for antineoplastic radiation therapy: Secondary | ICD-10-CM | POA: Diagnosis not present

## 2019-03-22 ENCOUNTER — Ambulatory Visit
Admission: RE | Admit: 2019-03-22 | Discharge: 2019-03-22 | Disposition: A | Discharge status: Home / Self Care | Payer: Medicare PPO | Source: Ambulatory Visit | Attending: Radiation Oncology | Admitting: Radiation Oncology

## 2019-03-22 DIAGNOSIS — Z510 Encounter for antineoplastic radiation therapy: Secondary | ICD-10-CM | POA: Diagnosis not present

## 2019-03-25 ENCOUNTER — Ambulatory Visit: Payer: Medicare PPO

## 2019-03-26 ENCOUNTER — Ambulatory Visit: Payer: Medicare PPO

## 2019-03-27 ENCOUNTER — Ambulatory Visit: Payer: Medicare PPO

## 2019-03-28 ENCOUNTER — Ambulatory Visit: Payer: Medicare PPO

## 2019-03-29 ENCOUNTER — Ambulatory Visit: Payer: Medicare PPO

## 2019-04-01 ENCOUNTER — Ambulatory Visit: Payer: Medicare PPO

## 2019-04-02 ENCOUNTER — Ambulatory Visit: Payer: Medicare PPO

## 2019-04-03 ENCOUNTER — Ambulatory Visit: Payer: Medicare PPO

## 2019-04-04 ENCOUNTER — Ambulatory Visit: Payer: Medicare PPO

## 2019-04-05 ENCOUNTER — Ambulatory Visit: Payer: Medicare PPO

## 2019-04-08 ENCOUNTER — Ambulatory Visit: Payer: Medicare PPO

## 2019-04-09 ENCOUNTER — Ambulatory Visit: Payer: Medicare PPO

## 2019-04-10 ENCOUNTER — Ambulatory Visit: Payer: Medicare PPO

## 2019-04-10 DIAGNOSIS — Z510 Encounter for antineoplastic radiation therapy: Secondary | ICD-10-CM | POA: Insufficient documentation

## 2019-04-10 DIAGNOSIS — C44319 Basal cell carcinoma of skin of other parts of face: Secondary | ICD-10-CM | POA: Insufficient documentation

## 2019-04-11 ENCOUNTER — Ambulatory Visit: Payer: Medicare PPO

## 2019-04-11 ENCOUNTER — Telehealth: Payer: Self-pay | Admitting: *Deleted

## 2019-04-11 NOTE — Telephone Encounter (Signed)
On 04/11/19 faxed medical records to [facility]. Ok per Dr Isidore Moos

## 2019-04-12 ENCOUNTER — Ambulatory Visit: Payer: Medicare PPO

## 2019-04-15 ENCOUNTER — Ambulatory Visit: Payer: Medicare PPO

## 2019-04-15 ENCOUNTER — Ambulatory Visit
Admission: RE | Admit: 2019-04-15 | Discharge: 2019-04-15 | Disposition: A | Discharge status: Home / Self Care | Payer: Medicare PPO | Source: Ambulatory Visit | Attending: Radiation Oncology | Admitting: Radiation Oncology

## 2019-04-15 DIAGNOSIS — Z510 Encounter for antineoplastic radiation therapy: Secondary | ICD-10-CM | POA: Diagnosis present

## 2019-04-15 DIAGNOSIS — C44319 Basal cell carcinoma of skin of other parts of face: Secondary | ICD-10-CM | POA: Diagnosis present

## 2019-04-16 ENCOUNTER — Ambulatory Visit: Payer: Medicare PPO

## 2019-04-17 ENCOUNTER — Ambulatory Visit: Payer: Medicare PPO

## 2019-04-18 ENCOUNTER — Ambulatory Visit: Payer: Medicare PPO

## 2019-04-18 ENCOUNTER — Ambulatory Visit
Admission: RE | Admit: 2019-04-18 | Discharge: 2019-04-18 | Disposition: A | Discharge status: Home / Self Care | Payer: Medicare PPO | Source: Ambulatory Visit | Attending: Radiation Oncology | Admitting: Radiation Oncology

## 2019-04-18 DIAGNOSIS — Z510 Encounter for antineoplastic radiation therapy: Secondary | ICD-10-CM | POA: Diagnosis not present

## 2019-04-19 ENCOUNTER — Ambulatory Visit: Payer: Medicare PPO

## 2019-04-19 ENCOUNTER — Ambulatory Visit
Admission: RE | Admit: 2019-04-19 | Discharge: 2019-04-19 | Disposition: A | Discharge status: Home / Self Care | Payer: Medicare PPO | Source: Ambulatory Visit | Attending: Radiation Oncology | Admitting: Radiation Oncology

## 2019-04-19 DIAGNOSIS — Z510 Encounter for antineoplastic radiation therapy: Secondary | ICD-10-CM | POA: Diagnosis not present

## 2019-04-22 ENCOUNTER — Ambulatory Visit: Payer: Medicare PPO

## 2019-04-22 ENCOUNTER — Ambulatory Visit
Admission: RE | Admit: 2019-04-22 | Discharge: 2019-04-22 | Disposition: A | Discharge status: Home / Self Care | Payer: Medicare PPO | Source: Ambulatory Visit | Attending: Radiation Oncology | Admitting: Radiation Oncology

## 2019-04-22 DIAGNOSIS — Z510 Encounter for antineoplastic radiation therapy: Secondary | ICD-10-CM | POA: Diagnosis not present

## 2019-04-23 ENCOUNTER — Ambulatory Visit
Admission: RE | Admit: 2019-04-23 | Discharge: 2019-04-23 | Disposition: A | Discharge status: Home / Self Care | Payer: Medicare PPO | Source: Ambulatory Visit | Attending: Radiation Oncology | Admitting: Radiation Oncology

## 2019-04-23 DIAGNOSIS — Z510 Encounter for antineoplastic radiation therapy: Secondary | ICD-10-CM | POA: Diagnosis not present

## 2019-04-24 ENCOUNTER — Ambulatory Visit
Admission: RE | Admit: 2019-04-24 | Discharge: 2019-04-24 | Disposition: A | Discharge status: Home / Self Care | Payer: Medicare PPO | Source: Ambulatory Visit | Attending: Radiation Oncology | Admitting: Radiation Oncology

## 2019-04-24 DIAGNOSIS — Z510 Encounter for antineoplastic radiation therapy: Secondary | ICD-10-CM | POA: Diagnosis not present

## 2019-04-25 ENCOUNTER — Ambulatory Visit: Payer: Medicare PPO

## 2019-04-26 ENCOUNTER — Ambulatory Visit
Admission: RE | Admit: 2019-04-26 | Discharge: 2019-04-26 | Disposition: A | Discharge status: Home / Self Care | Payer: Medicare PPO | Source: Ambulatory Visit | Attending: Radiation Oncology | Admitting: Radiation Oncology

## 2019-04-26 DIAGNOSIS — Z510 Encounter for antineoplastic radiation therapy: Secondary | ICD-10-CM | POA: Diagnosis not present

## 2019-04-29 ENCOUNTER — Other Ambulatory Visit: Payer: Self-pay

## 2019-04-29 ENCOUNTER — Ambulatory Visit
Admission: RE | Admit: 2019-04-29 | Discharge: 2019-04-29 | Disposition: A | Discharge status: Home / Self Care | Payer: Medicare PPO | Source: Ambulatory Visit | Attending: Radiation Oncology | Admitting: Radiation Oncology

## 2019-04-29 ENCOUNTER — Ambulatory Visit: Payer: Medicare PPO

## 2019-04-29 DIAGNOSIS — Z510 Encounter for antineoplastic radiation therapy: Secondary | ICD-10-CM | POA: Diagnosis not present

## 2019-04-30 ENCOUNTER — Ambulatory Visit: Payer: Medicare PPO

## 2019-04-30 ENCOUNTER — Ambulatory Visit
Admission: RE | Admit: 2019-04-30 | Discharge: 2019-04-30 | Disposition: A | Discharge status: Home / Self Care | Payer: Medicare PPO | Source: Ambulatory Visit | Attending: Radiation Oncology | Admitting: Radiation Oncology

## 2019-04-30 DIAGNOSIS — Z510 Encounter for antineoplastic radiation therapy: Secondary | ICD-10-CM | POA: Diagnosis not present

## 2019-05-01 ENCOUNTER — Ambulatory Visit
Admission: RE | Admit: 2019-05-01 | Discharge: 2019-05-01 | Disposition: A | Discharge status: Home / Self Care | Payer: Medicare PPO | Source: Ambulatory Visit | Attending: Radiation Oncology | Admitting: Radiation Oncology

## 2019-05-01 ENCOUNTER — Ambulatory Visit: Payer: Medicare PPO

## 2019-05-01 DIAGNOSIS — Z510 Encounter for antineoplastic radiation therapy: Secondary | ICD-10-CM | POA: Diagnosis not present

## 2019-05-02 ENCOUNTER — Ambulatory Visit: Payer: Medicare PPO

## 2019-05-02 ENCOUNTER — Ambulatory Visit
Admission: RE | Admit: 2019-05-02 | Discharge: 2019-05-02 | Disposition: A | Discharge status: Home / Self Care | Payer: Medicare PPO | Source: Ambulatory Visit | Attending: Radiation Oncology | Admitting: Radiation Oncology

## 2019-05-02 DIAGNOSIS — Z510 Encounter for antineoplastic radiation therapy: Secondary | ICD-10-CM | POA: Diagnosis not present

## 2019-05-03 ENCOUNTER — Ambulatory Visit: Payer: Medicare PPO

## 2019-05-03 ENCOUNTER — Ambulatory Visit
Admission: RE | Admit: 2019-05-03 | Discharge: 2019-05-03 | Disposition: A | Discharge status: Home / Self Care | Payer: Medicare PPO | Source: Ambulatory Visit | Attending: Radiation Oncology | Admitting: Radiation Oncology

## 2019-05-03 DIAGNOSIS — Z510 Encounter for antineoplastic radiation therapy: Secondary | ICD-10-CM | POA: Diagnosis not present

## 2019-05-06 ENCOUNTER — Ambulatory Visit
Admission: RE | Admit: 2019-05-06 | Discharge: 2019-05-06 | Disposition: A | Discharge status: Home / Self Care | Payer: Medicare PPO | Source: Ambulatory Visit | Attending: Radiation Oncology | Admitting: Radiation Oncology

## 2019-05-06 ENCOUNTER — Encounter: Payer: Self-pay | Admitting: Radiation Oncology

## 2019-05-06 ENCOUNTER — Other Ambulatory Visit: Payer: Self-pay

## 2019-05-06 DIAGNOSIS — Z510 Encounter for antineoplastic radiation therapy: Secondary | ICD-10-CM | POA: Diagnosis not present

## 2019-05-07 ENCOUNTER — Encounter: Payer: Self-pay | Admitting: *Deleted

## 2019-06-05 NOTE — Progress Notes (Signed)
I called the patient today about their upcoming follow-up appointment in radiation oncology.   Given the state of the  COVID-19 pandemic, concerning case numbers in our community, and guidance from [facility], I offered a phone assessment with the patient to determine if coming to the clinic was necessary. The patient accepted.  I let the patient know that I had spoken with Dr. Isidore Moos, and she wanted them to know the importance of washing their hands for at least 20 seconds at a time, especially after going out in public, and before they eat. Limit going out in public whenever possible. Do not touch your face, unless your hands are clean, such as when bathing. Get plenty of rest, eat well, and stay hydrated. Patient verbalized understanding and agreement.  Symptomatically, the patient is doing relatively well. She report reports that her skin is healing well since completing treatment, and "you can't even see the area when I wear my glasses". She denies any other issues related to her radiation treatment, but she does note some blurry vision to her left eye. She states she thinks it's getting better and she currently can't afford to see an optometrist because she uses non-emergent EMS transportation. She did ask if I knew of any providers that would assess a patient from a stretcher if she were to set transportation, I told her I did not but I would reach out to our social work department to see if they had any ideas/suggestions.   All questions were answered to the patient's satisfaction.  I encouraged the patient to call with any further questions. Otherwise, the plan is follow up with dermatology for regular skin checks (she states will only call if she notices anything "suspicious"--again because she currently can't afford transportation for provider visits), and reach out to radiation team as needed.    Patient is pleased with this plan, and we will cancel their upcoming follow-up to  reduce the risk of COVID-19 transmission.   After speaking with Ms. Oakely I called and spoke with Charlean Sanfilippo to see if she had any ideas/suggestions regarding patient's transportation issue. She stated given patients Medicare insurance her options were extremely limited and strictly regulated. She stated she would call the patient directly and explain restrictions/stipulations, and see if there may be other options feasible for patient (like wheelchair transportation through facility that patient currently resides at).

## 2019-06-07 ENCOUNTER — Telehealth: Payer: Self-pay | Admitting: General Practice

## 2019-06-07 ENCOUNTER — Ambulatory Visit
Admission: RE | Admit: 2019-06-07 | Discharge: 2019-06-07 | Disposition: A | Discharge status: Home / Self Care | Payer: Medicare PPO | Source: Ambulatory Visit | Attending: Radiation Oncology | Admitting: Radiation Oncology

## 2019-06-07 NOTE — Telephone Encounter (Signed)
[place] CSW Progress Notes  Call to patient at request of Dr Lanell Persons nurse - patient wants more cost effective non emergent EMS transport and wants option of having her eyes examined while on a stretcher.  Called patient, she is currently a resident of [facility].  As such, SNF is responsible for arranging all transportation and outside medical appointments.  Suggested patient discuss her needs w facility staff at SNF, she agrees to talk w "Aniceto Boss" the scheduler at [facility].  Edwyna Shell, LCSW Clinical Social Worker Phone:  [phone] Cell:  [phone]

## 2019-06-14 NOTE — Progress Notes (Signed)
  Patient Name: Gabrielle [name] MRN: [id number] DOB: 05-Mar-1950 Referring Physician: Mingo Amber Date of Service: 05/22/2019 [facility] [place]                                                        End Of Treatment Note  Diagnoses: C44.311-Basal cell carcinoma of skin of nose C44.319-Basal cell carcinoma of skin of other parts of face  Cancer Staging: Cancer Staging Basal cell carcinoma of glabella Staging form: Cutaneous Carcinoma of the Head and Neck, AJCC 8th Edition - Clinical: Stage III (cT3, cN0, cM0) - Signed by Eppie Gibson, MD on 03/04/2019  Intent: curative  Radiation Treatment Dates: 03/13/2019 through 05/06/2019 Site Technique Total Dose (Gy) Dose per Fx (Gy) Completed Fx Beam Energies  Nose: HN_Glabell IMRT 50/50 2.5 20/20 6X   Narrative: The patient tolerated radiation therapy relatively well with dramatic regression of the mass. Her attendance involved significant breaks due to transportation issues (patient's insurance refused medical transport for a long period of time).   Plan: The patient will follow-up with radiation oncology in 25mo, if transportation can be provided. -----------------------------------  Eppie Gibson, MD

## 2019-08-13 ENCOUNTER — Other Ambulatory Visit: Payer: Self-pay

## 2019-08-13 ENCOUNTER — Observation Stay
Admission: EM | Admit: 2019-08-13 | Discharge: 2019-08-15 | Disposition: A | Discharge status: Skilled Nursing Facility | Payer: Medicare PPO | Attending: Internal Medicine | Admitting: Internal Medicine

## 2019-08-13 ENCOUNTER — Encounter: Payer: Self-pay | Admitting: Emergency Medicine

## 2019-08-13 ENCOUNTER — Emergency Department: Payer: Medicare PPO

## 2019-08-13 DIAGNOSIS — R2990 Unspecified symptoms and signs involving the nervous system: Secondary | ICD-10-CM | POA: Insufficient documentation

## 2019-08-13 DIAGNOSIS — Z79899 Other long term (current) drug therapy: Secondary | ICD-10-CM | POA: Diagnosis not present

## 2019-08-13 DIAGNOSIS — R4701 Aphasia: Principal | ICD-10-CM | POA: Insufficient documentation

## 2019-08-13 DIAGNOSIS — E1122 Type 2 diabetes mellitus with diabetic chronic kidney disease: Secondary | ICD-10-CM | POA: Diagnosis not present

## 2019-08-13 DIAGNOSIS — R4182 Altered mental status, unspecified: Secondary | ICD-10-CM | POA: Diagnosis present

## 2019-08-13 DIAGNOSIS — G4733 Obstructive sleep apnea (adult) (pediatric): Secondary | ICD-10-CM

## 2019-08-13 DIAGNOSIS — N189 Chronic kidney disease, unspecified: Secondary | ICD-10-CM | POA: Diagnosis present

## 2019-08-13 DIAGNOSIS — J449 Chronic obstructive pulmonary disease, unspecified: Secondary | ICD-10-CM

## 2019-08-13 DIAGNOSIS — Z6841 Body Mass Index (BMI) 40.0 and over, adult: Secondary | ICD-10-CM

## 2019-08-13 DIAGNOSIS — N390 Urinary tract infection, site not specified: Secondary | ICD-10-CM | POA: Diagnosis not present

## 2019-08-13 DIAGNOSIS — E119 Type 2 diabetes mellitus without complications: Secondary | ICD-10-CM | POA: Diagnosis not present

## 2019-08-13 DIAGNOSIS — G459 Transient cerebral ischemic attack, unspecified: Secondary | ICD-10-CM

## 2019-08-13 DIAGNOSIS — Z20822 Contact with and (suspected) exposure to covid-19: Secondary | ICD-10-CM | POA: Diagnosis not present

## 2019-08-13 DIAGNOSIS — Z794 Long term (current) use of insulin: Secondary | ICD-10-CM

## 2019-08-13 DIAGNOSIS — N179 Acute kidney failure, unspecified: Secondary | ICD-10-CM | POA: Diagnosis not present

## 2019-08-13 DIAGNOSIS — Z7982 Long term (current) use of aspirin: Secondary | ICD-10-CM | POA: Insufficient documentation

## 2019-08-13 LAB — BLOOD GAS, VENOUS
Acid-Base Excess: 7.9 mmol/L — ABNORMAL HIGH (ref 0.0–2.0)
Bicarbonate: 33.2 mmol/L — ABNORMAL HIGH (ref 20.0–28.0)
O2 Saturation: 93.3 %
Patient temperature: 37
pCO2, Ven: 50 mmHg (ref 44.0–60.0)
pH, Ven: 7.43 (ref 7.250–7.430)
pO2, Ven: 66 mmHg — ABNORMAL HIGH (ref 32.0–45.0)

## 2019-08-13 LAB — DIFFERENTIAL
Abs Immature Granulocytes: 0.12 10*3/uL — ABNORMAL HIGH (ref 0.00–0.07)
Basophils Absolute: 0.1 10*3/uL (ref 0.0–0.1)
Basophils Relative: 1 %
Eosinophils Absolute: 0.1 10*3/uL (ref 0.0–0.5)
Eosinophils Relative: 1 %
Immature Granulocytes: 1 %
Lymphocytes Relative: 10 %
Lymphs Abs: 1.3 10*3/uL (ref 0.7–4.0)
Monocytes Absolute: 1 10*3/uL (ref 0.1–1.0)
Monocytes Relative: 8 %
Neutro Abs: 10.6 10*3/uL — ABNORMAL HIGH (ref 1.7–7.7)
Neutrophils Relative %: 79 %

## 2019-08-13 LAB — ETHANOL: Alcohol, Ethyl (B): <10 mg/dL (ref <10)

## 2019-08-13 LAB — COMPREHENSIVE METABOLIC PANEL
ALT: 8 U/L (ref 0–44)
AST: 10 U/L — ABNORMAL LOW (ref 15–41)
Albumin: 2.5 g/dL — ABNORMAL LOW (ref 3.5–5.0)
Alkaline Phosphatase: 54 U/L (ref 38–126)
Anion gap: 13 (ref 5–15)
BUN: 32 mg/dL — ABNORMAL HIGH (ref 8–23)
CO2: 31 mmol/L (ref 22–32)
Calcium: 8.6 mg/dL — ABNORMAL LOW (ref 8.9–10.3)
Chloride: 93 mmol/L — ABNORMAL LOW (ref 98–111)
Creatinine, Ser: 2.27 mg/dL — ABNORMAL HIGH (ref 0.44–1.00)
GFR calc Af Amer: 25 mL/min — ABNORMAL LOW (ref >60)
GFR calc non Af Amer: 22 mL/min — ABNORMAL LOW (ref >60)
Glucose, Bld: 174 mg/dL — ABNORMAL HIGH (ref 70–99)
Potassium: 3.9 mmol/L (ref 3.5–5.1)
Sodium: 137 mmol/L (ref 135–145)
Total Bilirubin: 1.1 mg/dL (ref 0.3–1.2)
Total Protein: 7.4 g/dL (ref 6.5–8.1)

## 2019-08-13 LAB — CBC
HCT: 27.3 % — ABNORMAL LOW (ref 36.0–46.0)
Hemoglobin: 8.4 g/dL — ABNORMAL LOW (ref 12.0–15.0)
MCH: 28.8 pg (ref 26.0–34.0)
MCHC: 30.8 g/dL (ref 30.0–36.0)
MCV: 93.5 fL (ref 80.0–100.0)
Platelets: 327 10*3/uL (ref 150–400)
RBC: 2.92 MIL/uL — ABNORMAL LOW (ref 3.87–5.11)
RDW: 14.5 % (ref 11.5–15.5)
WBC: 13.2 10*3/uL — ABNORMAL HIGH (ref 4.0–10.5)
nRBC: 0 % (ref 0.0–0.2)

## 2019-08-13 LAB — PROTIME-INR
INR: 1.1 (ref 0.8–1.2)
Prothrombin Time: 13.6 seconds (ref 11.4–15.2)

## 2019-08-13 LAB — SARS CORONAVIRUS 2 BY RT PCR (HOSPITAL ORDER, PERFORMED IN ~~LOC~~ HOSPITAL LAB): SARS Coronavirus 2: NEGATIVE

## 2019-08-13 LAB — APTT: aPTT: 36 seconds (ref 24–36)

## 2019-08-13 MED ORDER — LORAZEPAM 2 MG/ML IJ SOLN
0.5000 mg | Freq: Once | INTRAMUSCULAR | Status: AC
  Administered 2019-08-14: 01:00:00 0.5 mg via INTRAVENOUS
  Filled 2019-08-13: qty 1

## 2019-08-13 MED ORDER — ASPIRIN EC 81 MG PO TBEC
81.0000 mg | DELAYED_RELEASE_TABLET | Freq: Every day | ORAL | Status: DC
  Administered 2019-08-14 – 2019-08-15 (×2): 81 mg via ORAL
  Filled 2019-08-13 (×3): qty 1

## 2019-08-13 MED ORDER — INSULIN ASPART 100 UNIT/ML ~~LOC~~ SOLN
0.0000 [IU] | Freq: Three times a day (TID) | SUBCUTANEOUS | Status: DC
  Administered 2019-08-14 (×2): 4 [IU] via SUBCUTANEOUS
  Administered 2019-08-14: 3 [IU] via SUBCUTANEOUS
  Administered 2019-08-15: 13:00:00 7 [IU] via SUBCUTANEOUS
  Administered 2019-08-15: 09:00:00 4 [IU] via SUBCUTANEOUS
  Filled 2019-08-13 (×5): qty 1

## 2019-08-13 MED ORDER — SODIUM CHLORIDE 0.9 % IV BOLUS
500.0000 mL | Freq: Once | INTRAVENOUS | Status: AC
  Administered 2019-08-13: 500 mL via INTRAVENOUS

## 2019-08-13 MED ORDER — ACETAMINOPHEN 325 MG PO TABS: 650.0000 mg | ORAL_TABLET | ORAL | Status: DC | PRN

## 2019-08-13 MED ORDER — STROKE: EARLY STAGES OF RECOVERY BOOK: Freq: Once | Status: AC

## 2019-08-13 MED ORDER — INSULIN ASPART 100 UNIT/ML ~~LOC~~ SOLN: 0.0000 [IU] | Freq: Every day | SUBCUTANEOUS | Status: DC

## 2019-08-13 MED ORDER — ACETAMINOPHEN 650 MG RE SUPP: 650.0000 mg | RECTAL | Status: DC | PRN

## 2019-08-13 MED ORDER — ACETAMINOPHEN 160 MG/5ML PO SOLN
650.0000 mg | ORAL | Status: DC | PRN
  Filled 2019-08-13: qty 20.3

## 2019-08-13 MED ORDER — SODIUM CHLORIDE 0.9 % IV SOLN: INTRAVENOUS | Status: DC

## 2019-08-13 MED ORDER — ENOXAPARIN SODIUM 40 MG/0.4ML ~~LOC~~ SOLN: 40.0000 mg | SUBCUTANEOUS | Status: DC

## 2019-08-13 NOTE — Consult Note (Addendum)
[facility] Tele[facility]urology Consult Services   Date of Service:   08/13/2019 20:17:59  Impression:     .  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 NIH 2 for R leg drift, and numbness on R side. CT head unremarkable. Out of alteplase time window. Diff Dx: CVA, TIA, other Rec: - CTA brain, neck, CT perfusion - ASA - Neurology follow up - CVA admit - CVA orders - MRI brain D/w ER all of the above recs.  Metrics: Last Known Well: Unknown [facility] Notification Time: 08/13/2019 20:17:58 Arrival Time: 08/13/2019 19:31:00 Stamp Time: 08/13/2019 20:17:59 Time First Login Attempt: 08/13/2019 20:21:13 Symptoms: aphasia, R numbness, vision changes. NIHSS Start Assessment Time: 08/13/2019 20:33:12 Patient is not a candidate for Alteplase/Activase. Alteplase Medical Decision: 08/13/2019 20:35:48 Patient was not deemed candidate for Alteplase/Activase thrombolytics because of following reasons: Last Well Known Above 4.5 Hours.  CT head showed no acute hemorrhage or acute core infarct.  Radiologist was not called back for review of advanced imaging because reviewed ED Physician notified of diagnostic impression and management plan on 08/13/2019 20:49:13  Advanced Imaging: CTA Head and Neck ordered  CTP ordered   Our recommendations are outlined below.  Recommendations:     .  Activate Stroke Protocol Admission/Order Set     .  Stroke/Telemetry Floor     .  Neuro Checks     .  Bedside Swallow Eval     .  DVT Prophylaxis     .  IV Fluids, Normal Saline     .  Head of Bed 30 Degrees     .  Euglycemia and Avoid Hyperthermia (PRN Acetaminophen)     .  Antiplatelet Therapy Recommended     .  - CTA brain, neck, CT perfusion     .  - ASA     .  - Neurology follow up     .  -  CVA admit     .  - CVA orders     .  - MRI brain     .  D/w ER all of the above recs  Routine Consultation with [facility] for Follow up Care  Sign Out:     .  Discussed with Emergency Department Provider    ------------------------------------------------------------------------------  History of Present Illness: Patient is a 69 year old Female.  Patient was brought by EMS for symptoms of aphasia, R numbness, vision changes.  69 yr old woman, hx of HTN, DM, COPD, oxygen dependent, with onset of aphasia, word finding, vision blurriness, R arm numbness. She is not sure onset of symptoms, she was ok at lunch time, she then took a nap, and when she woke up from nap, she had these symptoms, she does not know neither does the nursing home, time of nap, as she is mostly home bound. I called nursing facility, and time of onset of symptoms is unknown. She feels better with her speech, but has R sided numbness, weakness. Sister at bedside reports she had similar symptoms yesterday.   Past Medical History:     . Hypertension     . Diabetes Mellitus      Examination: BP(112/61), Pulse(101), Blood Glucose(pending) 1A: Level of Consciousness - Alert; keenly responsive + 0 1B: Ask Month and Age - Both Questions Right + 0 1C: Blink Eyes &  Squeeze Hands - Performs Both Tasks + 0 2: Test Horizontal Extraocular Movements - Normal + 0 3: Test Visual Fields - No Visual Loss + 0 4: Test Facial Palsy (Use Grimace if Obtunded) - Normal symmetry + 0 5A: Test Left Arm Motor Drift - No Drift for 10 Seconds + 0 5B: Test Right Arm Motor Drift - No Drift for 10 Seconds + 0 6A: Test Left Leg Motor Drift - No Drift for 5 Seconds + 0 6B: Test Right Leg Motor Drift - Drift, but doesn't hit bed + 1 7: Test Limb Ataxia (FNF/Heel-Shin) - No Ataxia + 0 8: Test Sensation - Mild-Moderate Loss: Less Sharp/More Dull + 1 9: Test Language/Aphasia - Normal; No aphasia + 0 10: Test Dysarthria - Normal +  0 11: Test Extinction/Inattention - No abnormality + 0  NIHSS Score: 2  Pre-Morbid Modified Ranking Scale: 4 Points = Moderately severe disability; unable to walk and attend to bodily needs without assistance   Patient/Family was informed the Neurology Consult would occur via TeleHealth consult by way of interactive audio and video telecommunications and consented to receiving care in this manner.   Patient is being evaluated for possible acute neurologic impairment and high probability of imminent or life-threatening deterioration. I spent total of 25 minutes providing care to this patient, including time for face to face visit via telemedicine, review of medical records, imaging studies and discussion of findings with providers, the patient and/or family.   Dr Lloyd Huger   [facility] [phone]  Case [id number]   d/w Dr Ellender Hose, GFR is low, in 63s, and she is improving NIH 1-2. Therefore given adverse effect on kidneys from contrast, and low NIH score, she is improving, hold CTA and admit her for CVA work up, and MRI Brain, treat with ASA. Pt also refused contrast earlier given kidney issue. d/w ER.

## 2019-08-13 NOTE — ED Triage Notes (Signed)
Pt presents from [facility] via [facility] with c/o altered mental status. Pt states "my vision is just dim and my tongue is heavy". Pt c/o trouble with speech, but speaking in complete sentences at this time. Hx of copd chronically on 2L. Pt has noted hx of anxiety. Stroke screen negative

## 2019-08-13 NOTE — ED Notes (Signed)
Pt to CT

## 2019-08-13 NOTE — H&P (Signed)
History and Physical    Gabrielle [name] NFA:[id number] DOB: [birth year] DOA: 08/13/2019  PCP: [name], MD  Patient coming from: [place] have personally briefly reviewed patient's old medical records in [place]  Chief Complaint: Aphasia, right-sided numbness and weakness  HPI: Gabrielle [name] is a 69 y.o. 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  Patient reports that she took a nap at about 5 PM today and was woken up at around 6:30 PM and when she woke up she had difficulty with her speech.  Felt like her tongue was "thick."  Felt she had a heavier right upper extremity from her elbow down although she does have a history of carpal tunnel and has to wear an arm splint frequently.  Also feels some weakness to her right lower extremity as well.  Also felt like her vision was "dim" although not quite blurred.  This similar episode happened yesterday after she woke up from a nap.  Again at that time she noted dim vision, slurred speech and some confusion and felt like she was in a "coma."  However she felt better within the next few hours so did not seek any medical help.  Denies any chest pain, shortness of breath or palpitation.  No headaches.  Feels her symptoms are improving now that she has been in the ED.  ED Course: She is afebrile, normotensive and on her home 2 L of oxygen.    Review of Systems:  Constitutional: No Weight Change, No Fever ENT/Mouth: No sore throat, No Rhinorrhea Eyes: No Eye Pain, No Vision Changes Cardiovascular: No Chest Pain, no SOB,  No Edema, No Palpitations Respiratory: No Cough, No Sputum Gastrointestinal: No Nausea, No Vomiting, No Diarrhea, Genitourinary: no Urinary Incontinence Musculoskeletal: No Arthralgias, No Myalgias Skin: No Skin Lesions, No  Pruritus, Neuro: + Weakness, + Numbness,  No Loss of Consciousness, No Syncope Psych: No Anxiety/Panic, No Depression, + decrease appetite Heme/Lymph: No Bruising, No Bleeding  Past Medical History:  Diagnosis Date  . Allergy    seasonal allergies  . Arthritis   . Chronic kidney disease    she reports grade 2 kidney disease  . Chronic venous stasis dermatitis of both lower extremities   . Diabetes mellitus without complication ([place])   . Hypertension   . Morbid obesity ([place])   . Osteoarthritis   . Oxygen dependent   . Skin cancer   . Sleep apnea     Past Surgical History:  Procedure Laterality Date  . NO PAST SURGERIES    . none       reports that she has never smoked. She has never used smokeless tobacco. She reports that she does not drink alcohol and does not use drugs.  Allergies  Allergen Reactions  . Lasix [Furosemide]     Numbness and tingling of the face   . Ultram [Tramadol] Other (See Comments)    Pt reports, "makes my head feel funny."  . Erythromycin Rash  . Plasticized Base [Plastibase] Rash  . Tetracyclines & Related Rash    Family History  Problem Relation Age of Onset  . Diabetes Mellitus II Father   . CAD Father   . CAD Mother   . Hiatal hernia Mother      Prior to Admission medications   Medication Sig Start Date End Date Taking?  Authorizing Provider  acetaminophen (TYLENOL) 500 MG tablet Take 500 mg by mouth 3 (three) times daily.    [provider]  aspirin 325 MG tablet Take 325 mg by mouth daily.    [provider]  benztropine (COGENTIN) 1 MG tablet Take 1 mg by mouth 2 (two) times daily.  04/26/17   [provider]  busPIRone (BUSPAR) 7.5 MG tablet  12/25/18   [provider]  Cholecalciferol (VITAMIN D3) 1000 units CAPS Take 1,000 Units by mouth daily.     [provider]  clobetasol ointment (TEMOVATE) 9.38 % Apply 1 application topically 2 (two) times daily.    [provider]   docusate sodium (COLACE) 100 MG capsule Take 100 mg by mouth 2 (two) times daily.    [provider]  Dulaglutide (TRULICITY [place]) Inject 2 mg into the skin. Every Wednesday    [provider]  ethacrynic acid (EDECRIN) 25 MG tablet Take 3 tablets (75 mg total) by mouth 2 (two) times daily. 05/04/16   Vaughan Basta, MD  fluticasone (FLONASE) 50 MCG/ACT nasal spray Place 1 spray into both nostrils as needed.     [provider]  HUMALOG 100 UNIT/ML cartridge 5 Units. 5 units with every meal plus sliding scale coverage 12/25/18   [provider]  hydrALAZINE (APRESOLINE) 25 MG tablet Take 1 tablet by mouth 4 (four) times daily. 05/01/17   [provider]  insulin glargine (LANTUS) 100 UNIT/ML injection Inject 0.85 mLs (85 Units total) into the skin daily. Patient taking differently: Inject 43 Units into the skin daily. 43 units in am and pm 05/04/16   Vaughan Basta, MD  JARDIANCE 25 MG TABS tablet Take 1 tablet by mouth daily. 05/01/17   [provider]  Melatonin 1 MG TABS Take 3 mg by mouth. At bedtime    [provider]  nabumetone (RELAFEN) 500 MG tablet Take 1 tablet by mouth daily.  06/16/15   [provider]  nystatin (NYSTATIN) powder Apply 1 g topically as needed.    [provider]  perphenazine (TRILAFON) 2 MG tablet  12/25/18   [provider]  polyethylene glycol (MIRALAX / GLYCOLAX) packet Take 17 g by mouth daily.    [provider]  vitamin B-12 (CYANOCOBALAMIN) 1000 MCG tablet Take 1,000 mcg by mouth daily.     [provider]    Physical Exam: Vitals:   08/13/19 1952 08/13/19 2000 08/13/19 2046 08/13/19 2100  BP:  (!) 113/58 128/74 (!) 135/58  Pulse:  94 99 77  Resp:  (!) 26 (!) 26 19  Temp: 98.8 F (37.1 C)     TempSrc: Oral     SpO2:  99% 99% 98%  Weight:        Constitutional: NAD, calm, comfortable, morbidly obese female laying flat in bed Vitals:    08/13/19 1952 08/13/19 2000 08/13/19 2046 08/13/19 2100  BP:  (!) 113/58 128/74 (!) 135/58  Pulse:  94 99 77  Resp:  (!) 26 (!) 26 19  Temp: 98.8 F (37.1 C)     TempSrc: Oral     SpO2:  99% 99% 98%  Weight:       Eyes: PERRL, lids and conjunctivae normal ENMT: Mucous membranes are moist.  Neck: normal, supple Respiratory: clear to auscultation bilaterally, no wheezing, no crackles. Normal respiratory effort on chronic 2 L normal saline. No accessory muscle use.  Cardiovascular: Regular rate and rhythm, no murmurs / rubs / gallops. No  extremity edema.   Abdomen: no tenderness, no masses palpated.  Bowel sounds positive.  Musculoskeletal: no clubbing / cyanosis. No joint deformity upper and lower extremities. Good ROM, no contractures. Normal muscle tone.  Skin: no rashes, lesions, ulcers. No induration Neurologic: CN 2-12 grossly intact. Sensation intact. Strength 4/5 in right upper and lower extremity.  Intact heel-to-shin although with more difficulty lifting the right lower extremity.  Intact finger-nose. Psychiatric: Normal judgment and insight. Alert and oriented x 3. Normal mood.     Labs on Admission: I have personally reviewed following labs and imaging studies  CBC: Recent Labs  Lab 08/13/19 2006  WBC 13.2*  NEUTROABS 10.6*  HGB 8.4*  HCT 27.3*  MCV 93.5  PLT 130   Basic Metabolic Panel: Recent Labs  Lab 08/13/19 2006  NA 137  K 3.9  CL 93*  CO2 31  GLUCOSE 174*  BUN 32*  CREATININE 2.27*  CALCIUM 8.6*   GFR: CrCl cannot be calculated (Unknown ideal weight.). Liver Function Tests: Recent Labs  Lab 08/13/19 2006  AST 10*  ALT 8  ALKPHOS 54  BILITOT 1.1  PROT 7.4  ALBUMIN 2.5*   No results for input(s): LIPASE, AMYLASE in the last 168 hours. No results for input(s): AMMONIA in the last 168 hours. Coagulation Profile: Recent Labs  Lab 08/13/19 2006  INR 1.1   Cardiac Enzymes: No results for input(s): CKTOTAL, CKMB, CKMBINDEX, TROPONINI  in the last 168 hours. BNP (last 3 results) No results for input(s): PROBNP in the last 8760 hours. HbA1C: No results for input(s): HGBA1C in the last 72 hours. CBG: No results for input(s): GLUCAP in the last 168 hours. Lipid Profile: No results for input(s): CHOL, HDL, LDLCALC, TRIG, CHOLHDL, LDLDIRECT in the last 72 hours. Thyroid Function Tests: No results for input(s): TSH, T4TOTAL, FREET4, T3FREE, THYROIDAB in the last 72 hours. Anemia Panel: No results for input(s): VITAMINB12, FOLATE, FERRITIN, TIBC, IRON, RETICCTPCT in the last 72 hours. Urine analysis:    Component Value Date/Time   COLORURINE YELLOW (A) 05/02/2017 1706   APPEARANCEUR HAZY (A) 05/02/2017 1706   LABSPEC 1.011 05/02/2017 1706   PHURINE 5.0 05/02/2017 1706   GLUCOSEU >=500 (A) 05/02/2017 1706   HGBUR NEGATIVE 05/02/2017 1706   BILIRUBINUR NEGATIVE 05/02/2017 1706   KETONESUR NEGATIVE 05/02/2017 1706   PROTEINUR NEGATIVE 05/02/2017 1706   NITRITE POSITIVE (A) 05/02/2017 1706   LEUKOCYTESUR LARGE (A) 05/02/2017 1706    Radiological Exams on Admission: DG Chest Portable 1 View  Result Date: 08/13/2019 CLINICAL DATA:  Altered mental status EXAM: PORTABLE CHEST 1 VIEW COMPARISON:  05/02/2017 FINDINGS: Mild cardiomegaly. No focal opacity or pleural effusion. No pneumothorax. IMPRESSION: No active disease. Mild cardiomegaly. Electronically Signed   By: Donavan Foil M.D.   On: 08/13/2019 20:37   CT HEAD CODE STROKE WO CONTRAST  Result Date: 08/13/2019 CLINICAL DATA:  Code stroke. Slurred speech. Visual disturbance. Right arm numbness and weakness beginning 1730 hours. EXAM: CT HEAD WITHOUT CONTRAST TECHNIQUE: Contiguous axial images were obtained from the base of the skull through the vertex without intravenous contrast. COMPARISON:  None. FINDINGS: Brain: Moderate brain atrophy. Mild chronic small-vessel change of the hemispheric white matter. No sign of acute infarction, mass lesion, hemorrhage, hydrocephalus or  extra-axial collection. Vascular: There is atherosclerotic calcification of the major vessels at the base of the brain. Skull: Negative Sinuses/Orbits: Clear/normal Other: None ASPECTS ([place] Stroke Program Early CT Score) - Ganglionic level infarction (caudate, lentiform nuclei, internal capsule, insula, M1-M3 cortex): 7 -  Supraganglionic infarction (M4-M6 cortex): 3 Total score (0-10 with 10 being normal): 10 IMPRESSION: 1. No acute CT finding. Atrophy and mild chronic small-vessel change of the hemispheric white matter. 2. ASPECTS is 10. 3. These results were called by telephone at the time of interpretation on 08/13/2019 at 8:34 pm to provider Duffy Bruce , who verbally acknowledged these results. Electronically Signed   By: Nelson Chimes M.D.   On: 08/13/2019 20:35      Assessment/Plan Aphasia/right extremity weakness CT head negative.  Unfortunately cannot get CTA contrasted study due to worsening renal function. Tele-neuro also advised to hold off on CTA. We will proceed with MRI brain and give in time dilated before hand obtain echocardiogram  start daily aspirin and atorvastatin Obtain A1c and lipids PT/OT/SLT Frequent neuro checks and keep on telemetry Allow for permissive hypertension with blood pressure treatment as needed only if systolic goes above 814  ? Acute on CKD Unsure normal baseline and GFR Monitor with repeat in the morning and IV fluids overnight  Chronic anemia Worsening since 2 years ago but no current baseline to compare We will check iron panel, vitamin B12 and folate  Hypertension Allow for permissive HTN  COPD with chronic hypoxemia Continue 2 L of O2  Insulin-dependent type 2 diabetes Normally on 43 units twice daily of Lantus, Humalog 5 units 3 times daily with meals and sliding scale Start with resistance sliding scale for now since BG is controlled here and she has had decreased p.o. intake  OSA Intolerance of BiPAP.  Suspect some symptoms of  confusion when patient wakes up and difficulty with speech could be due to hypercarbia. Patient will need to follow-up outpatient with sleep specialist  Obesity BMI greater than 55  DVT prophylaxis:.Lovenox Code Status: Full Family Communication: Plan discussed with patient and sister at bedside  disposition Plan: Home with at least 2 midnight stays  Consults called:  Admission status: inpatient   Status is: Inpatient  Remains inpatient appropriate because:Inpatient level of care appropriate due to severity of illness   Dispo: The patient is from: SNF              Anticipated d/c is to: SNF              Anticipated d/c date is: 3 days              Patient currently is not medically stable to d/c.         Orene Desanctis DO [facility]   If 7PM-7AM, please contact night-coverage [email or web address]   08/13/2019, 11:11 PM

## 2019-08-13 NOTE — ED Notes (Addendum)
Pt has been resident of [facility] care x 2 years. Pt states she took a nap "around supper time" Pt was aroused by staff and pt states her tongue "felt thick, and knew the words to say but they sounded funny" Pt stated she could hear her speech as being slurred. Pt also reported her rt lower arm "felt funny" pt had no strength deficit. Pt also reported vision "being dimmed". Pt reports symptoms gradually got better. Pt had similar symptoms yesterday and staff wanted to send to ED yesterday but didn't want to lose her bed in facility.

## 2019-08-13 NOTE — ED Provider Notes (Addendum)
[facility] Emergency Department Provider Note  ____________________________________________   First MD Initiated Contact with Patient 08/13/19 1951     (approximate)  I have reviewed the triage vital signs and the nursing notes.   HISTORY  Chief Complaint Altered Mental Status    HPI Gabrielle [name] is a 69 y.o. female with past medical history as below including morbid obesity, chronic hypoxic, diabetes, hypertension, here with altered mental status.  The patient states that she was last normal at around 5 PM.  She fell asleep.  When she woke up for dinner at around 6/, she noticed that she had blurry, dark vision bilaterally.  She also felt like her tongue was heavy and she was having slurred speech.  She continues to feel like her speech is abnormal.  She also noticed right forearm weakness and numbness sensation.  Of note, this occurred several days ago, also in the setting of waking up.  She states she continues to feel symptoms that they have mildly improved.  Denies any headache.  Denies known history of TIA or stroke.  No recent medication change.  Of note, she does state that she believes she has sleep apnea and is unsure if she is dropping her oxygen while sleeping, but she has reportedly not tolerated a BiPAP mask in the past.        Past Medical History:  Diagnosis Date   Allergy    seasonal allergies   Arthritis    Chronic kidney disease    she reports grade 2 kidney disease   Chronic venous stasis dermatitis of both lower extremities    Diabetes mellitus without complication ([place])    Hypertension    Morbid obesity ([place])    Osteoarthritis    Oxygen dependent    Skin cancer    Sleep apnea     Patient Active Problem List   Diagnosis Date Noted   Basal cell carcinoma of glabella 03/04/2019   Cellulitis 04/29/2016   Acute on chronic kidney failure ([place]) 10/02/2015   Diabetes ([place]) 10/01/2015   SOB (shortness of breath)  10/01/2015   AKI (acute kidney injury) ([place]) 10/01/2015   Hyperkalemia 07/21/2015   Uncontrolled hypertension 07/21/2015    Past Surgical History:  Procedure Laterality Date   NO PAST SURGERIES     none      Prior to Admission medications   Medication Sig Start Date End Date Taking? Authorizing Provider  acetaminophen (TYLENOL) 500 MG tablet Take 500 mg by mouth 3 (three) times daily.    [provider]  aspirin 325 MG tablet Take 325 mg by mouth daily.    [provider]  benztropine (COGENTIN) 1 MG tablet Take 1 mg by mouth 2 (two) times daily.  04/26/17   [provider]  busPIRone (BUSPAR) 7.5 MG tablet  12/25/18   [provider]  Cholecalciferol (VITAMIN D3) 1000 units CAPS Take 1,000 Units by mouth daily.     [provider]  clobetasol ointment (TEMOVATE) 8.01 % Apply 1 application topically 2 (two) times daily.    [provider]  docusate sodium (COLACE) 100 MG capsule Take 100 mg by mouth 2 (two) times daily.    [provider]  Dulaglutide (TRULICITY [place]) Inject 2 mg into the skin. Every Wednesday    [provider]  ethacrynic acid (EDECRIN) 25 MG tablet Take 3 tablets (75 mg total) by mouth 2 (two) times daily. 05/04/16   Vaughan Basta, MD  fluticasone (FLONASE) 50  MCG/ACT nasal spray Place 1 spray into both nostrils as needed.     [provider]  HUMALOG 100 UNIT/ML cartridge 5 Units. 5 units with every meal plus sliding scale coverage 12/25/18   [provider]  hydrALAZINE (APRESOLINE) 25 MG tablet Take 1 tablet by mouth 4 (four) times daily. 05/01/17   [provider]  insulin glargine (LANTUS) 100 UNIT/ML injection Inject 0.85 mLs (85 Units total) into the skin daily. Patient taking differently: Inject 43 Units into the skin daily. 43 units in am and pm 05/04/16   Vaughan Basta, MD  JARDIANCE 25 MG TABS tablet Take 1 tablet by mouth daily. 05/01/17    [provider]  Melatonin 1 MG TABS Take 3 mg by mouth. At bedtime    [provider]  nabumetone (RELAFEN) 500 MG tablet Take 1 tablet by mouth daily.  06/16/15   [provider]  nystatin (NYSTATIN) powder Apply 1 g topically as needed.    [provider]  perphenazine (TRILAFON) 2 MG tablet  12/25/18   [provider]  polyethylene glycol (MIRALAX / GLYCOLAX) packet Take 17 g by mouth daily.    [provider]  vitamin B-12 (CYANOCOBALAMIN) 1000 MCG tablet Take 1,000 mcg by mouth daily.     [provider]    Allergies Lasix [furosemide], Ultram [tramadol], Erythromycin, Plasticized base [plastibase], and Tetracyclines & related  Family History  Problem Relation Age of Onset   Diabetes Mellitus II Father    CAD Father    CAD Mother    Hiatal hernia Mother     Social History Social History   Tobacco Use   Smoking status: Never Smoker   Smokeless tobacco: Never Used  Scientific laboratory technician Use: Never used  Substance Use Topics   Alcohol use: No   Drug use: No    Review of Systems  Review of Systems  Constitutional: Positive for fatigue. Negative for fever.  HENT: Negative for congestion and sore throat.   Eyes: Negative for visual disturbance.  Respiratory: Negative for cough and shortness of breath.   Cardiovascular: Negative for chest pain.  Gastrointestinal: Negative for abdominal pain, diarrhea, nausea and vomiting.  Genitourinary: Negative for flank pain.  Musculoskeletal: Negative for back pain and neck pain.  Skin: Negative for rash and wound.  Neurological: Positive for speech difficulty, weakness and numbness.  All other systems reviewed and are negative.    ____________________________________________  PHYSICAL EXAM:      VITAL SIGNS: ED Triage Vitals  Enc Vitals Group     BP 08/13/19 1948 112/61     Pulse Rate 08/13/19 1948 (!) 101     Resp 08/13/19 1948 16     Temp 08/13/19 1948  98.7 F (37.1 C)     Temp Source 08/13/19 1948 Oral     SpO2 08/13/19 1948 97 %     Weight 08/13/19 1945 (!) 345 lb 14.4 oz (156.9 kg)     Height --      Head Circumference --      Peak Flow --      Pain Score 08/13/19 1944 0     Pain Loc --      Pain Edu? --      Excl. in [place]? --      Physical Exam Vitals and nursing note reviewed.  Constitutional:      General: She is not in acute distress.    Appearance: She is well-developed.  HENT:  Head: Normocephalic and atraumatic.  Eyes:     Conjunctiva/sclera: Conjunctivae normal.  Cardiovascular:     Rate and Rhythm: Normal rate and regular rhythm.     Heart sounds: Normal heart sounds.  Pulmonary:     Effort: Pulmonary effort is normal. No respiratory distress.     Breath sounds: No wheezing.  Abdominal:     General: There is no distension.  Musculoskeletal:     Cervical back: Neck supple.  Skin:    General: Skin is warm.     Capillary Refill: Capillary refill takes less than 2 seconds.     Findings: No rash.  Neurological:     Mental Status: She is alert and oriented to person, place, and time.     Motor: No abnormal muscle tone.     Comments: Slow, deliberate speech with very subtle slurring.  Cranial nerves otherwise intact.  Extraocular movements intact.  Tongue protrusion is midline.  Normal shoulder shrug.  Strength out of 5 bilateral upper and lower extremities although subjectively weaker on the right upper extremity.  Endorses normal sensation in the bilateral lower extremities, though slightly diminished in the right upper extremity.Gait deferred.       ____________________________________________   LABS (all labs ordered are listed, but only abnormal results are displayed)  Labs Reviewed  CBC - Abnormal; Notable for the following components:      Result Value   WBC 13.2 (*)    RBC 2.92 (*)    Hemoglobin 8.4 (*)    HCT 27.3 (*)    All other components within normal limits  DIFFERENTIAL - Abnormal;  Notable for the following components:   Neutro Abs 10.6 (*)    Abs Immature Granulocytes 0.12 (*)    All other components within normal limits  COMPREHENSIVE METABOLIC PANEL - Abnormal; Notable for the following components:   Chloride 93 (*)    Glucose, Bld 174 (*)    BUN 32 (*)    Creatinine, Ser 2.27 (*)    Calcium 8.6 (*)    Albumin 2.5 (*)    AST 10 (*)    GFR calc non Af Amer 22 (*)    GFR calc Af Amer 25 (*)    All other components within normal limits  BLOOD GAS, VENOUS - Abnormal; Notable for the following components:   pO2, Ven 66.0 (*)    Bicarbonate 33.2 (*)    Acid-Base Excess 7.9 (*)    All other components within normal limits  SARS CORONAVIRUS 2 BY RT PCR (HOSPITAL ORDER, [facility])  ETHANOL  PROTIME-INR  APTT  URINE DRUG SCREEN, QUALITATIVE ([facility] ONLY)  URINALYSIS, ROUTINE W REFLEX MICROSCOPIC    ____________________________________________  EKG: Sinus tachycardia, ventricular rate 100.  QRS 89, QTc 480.  Multiform PVCs.  Occasional PACs.  No acute ST elevations or depressions. ________________________________________  RADIOLOGY All imaging, including plain films, CT scans, and ultrasounds, independently reviewed by me, and interpretations confirmed via formal radiology reads.  ED MD interpretation:   Chest x-ray: Cardiomegaly, no acute abnormality CT head: No acute abnormality  Official radiology report(s): DG Chest Portable 1 View  Result Date: 08/13/2019 CLINICAL DATA:  Altered mental status EXAM: PORTABLE CHEST 1 VIEW COMPARISON:  05/02/2017 FINDINGS: Mild cardiomegaly. No focal opacity or pleural effusion. No pneumothorax. IMPRESSION: No active disease. Mild cardiomegaly. Electronically Signed   By: Donavan Foil M.D.   On: 08/13/2019 20:37   CT HEAD CODE STROKE WO CONTRAST  Result Date: 08/13/2019  CLINICAL DATA:  Code stroke. Slurred speech. Visual disturbance. Right arm numbness and weakness beginning 1730 hours. EXAM: CT  HEAD WITHOUT CONTRAST TECHNIQUE: Contiguous axial images were obtained from the base of the skull through the vertex without intravenous contrast. COMPARISON:  None. FINDINGS: Brain: Moderate brain atrophy. Mild chronic small-vessel change of the hemispheric white matter. No sign of acute infarction, mass lesion, hemorrhage, hydrocephalus or extra-axial collection. Vascular: There is atherosclerotic calcification of the major vessels at the base of the brain. Skull: Negative Sinuses/Orbits: Clear/normal Other: None ASPECTS ([place] Stroke Program Early CT Score) - Ganglionic level infarction (caudate, lentiform nuclei, internal capsule, insula, M1-M3 cortex): 7 - Supraganglionic infarction (M4-M6 cortex): 3 Total score (0-10 with 10 being normal): 10 IMPRESSION: 1. No acute CT finding. Atrophy and mild chronic small-vessel change of the hemispheric white matter. 2. ASPECTS is 10. 3. These results were called by telephone at the time of interpretation on 08/13/2019 at 8:34 pm to provider Duffy Bruce , who verbally acknowledged these results. Electronically Signed   By: Nelson Chimes M.D.   On: 08/13/2019 20:35    ____________________________________________  PROCEDURES   Procedure(s) performed (including Critical Care):  .Critical Care Performed by: Duffy Bruce, MD Authorized by: Duffy Bruce, MD   Critical care provider statement:    Critical care time (minutes):  35   Critical care time was exclusive of:  Separately billable procedures and treating other patients and teaching time   Critical care was necessary to treat or prevent imminent or life-threatening deterioration of the following conditions:  Circulatory failure, cardiac failure and CNS failure or compromise   Critical care was time spent personally by me on the following activities:  Development of treatment plan with patient or surrogate, discussions with consultants, evaluation of patient's response to treatment, examination of  patient, obtaining history from patient or surrogate, ordering and performing treatments and interventions, ordering and review of laboratory studies, ordering and review of radiographic studies, pulse oximetry, re-evaluation of patient's condition and review of old charts   I assumed direction of critical care for this patient from another provider in my specialty: no   .1-3 Lead EKG Interpretation Performed by: Duffy Bruce, MD Authorized by: Duffy Bruce, MD     Interpretation: normal     ECG rate:  90-110   ECG rate assessment: tachycardic     Ectopy: PAC     Conduction: normal   Comments:     Indication: Stroke like symptoms    ____________________________________________  INITIAL IMPRESSION / MDM / [place] / ED COURSE  As part of my medical decision making, I reviewed the following data within the [place] notes reviewed and incorporated, Old chart reviewed, Notes from prior ED visits, and [place] Controlled Substance Database       *Gabrielle [name] was evaluated in Emergency Department on 08/13/2019 for the symptoms described in the history of present illness. She was evaluated in the context of the global COVID-19 pandemic, which necessitated consideration that the patient might be at risk for infection with the SARS-CoV-2 virus that causes COVID-19. Institutional protocols and algorithms that pertain to the evaluation of patients at risk for COVID-19 are in a state of rapid change based on information released by regulatory bodies including the CDC and federal and state organizations. These policies and algorithms were followed during the patient's care in the ED.  Some ED evaluations and interventions may be delayed as a result of limited staffing during the pandemic.*  Medical Decision Making: 69 year old female here with slurred speech and right arm numbness/weakness.  Initially, report was last normal around 5, so code stroke activated.   However, on further discussion with the facility, it appears that last known normal was actually not 5 PM and was likely significantly earlier.  She does not know exactly, but she is outside of the window for TPA.  Moreover, she has minimal deficits and seems to be improving, so would be very hesitant to administer anyways. NIHSS only 1-2.   Discussed with Teleneuro. Will admit for CVA work-up. DDx includes transient hypoxia when asleep causing stroke-like deficits. Because of Cr elvaiton, unable to obtain CT Angio. Given absence of signs of LVO, feel this is reasonable. Will admit for MRI. ____________________________________________  FINAL CLINICAL IMPRESSION(S) / ED DIAGNOSES  Final diagnoses:  Stroke-like episode  AKI (acute kidney injury) ([place])     MEDICATIONS GIVEN DURING THIS VISIT:  Medications  sodium chloride 0.9 % bolus 500 mL (500 mLs Intravenous New Bag/Given 08/13/19 2145)     ED Discharge Orders    None       Note:  This document was prepared using Dragon voice recognition software and may include unintentional dictation errors.   Duffy Bruce, MD 08/13/19 2202    Duffy Bruce, MD 08/13/19 2202

## 2019-08-14 ENCOUNTER — Inpatient Hospital Stay: Payer: Medicare PPO

## 2019-08-14 ENCOUNTER — Encounter: Payer: Self-pay | Admitting: Family Medicine

## 2019-08-14 ENCOUNTER — Inpatient Hospital Stay (HOSPITAL_BASED_OUTPATIENT_CLINIC_OR_DEPARTMENT_OTHER)
Admit: 2019-08-14 | Discharge: 2019-08-14 | Disposition: A | Discharge status: Home / Self Care | Payer: Medicare PPO | Attending: Family Medicine | Admitting: Family Medicine

## 2019-08-14 DIAGNOSIS — G4733 Obstructive sleep apnea (adult) (pediatric): Secondary | ICD-10-CM

## 2019-08-14 DIAGNOSIS — J4489 Other specified chronic obstructive pulmonary disease: Secondary | ICD-10-CM

## 2019-08-14 DIAGNOSIS — R2990 Unspecified symptoms and signs involving the nervous system: Secondary | ICD-10-CM

## 2019-08-14 DIAGNOSIS — I361 Nonrheumatic tricuspid (valve) insufficiency: Secondary | ICD-10-CM | POA: Diagnosis not present

## 2019-08-14 DIAGNOSIS — N179 Acute kidney failure, unspecified: Secondary | ICD-10-CM | POA: Diagnosis not present

## 2019-08-14 DIAGNOSIS — N3001 Acute cystitis with hematuria: Secondary | ICD-10-CM

## 2019-08-14 DIAGNOSIS — J449 Chronic obstructive pulmonary disease, unspecified: Secondary | ICD-10-CM

## 2019-08-14 DIAGNOSIS — N189 Chronic kidney disease, unspecified: Secondary | ICD-10-CM | POA: Diagnosis not present

## 2019-08-14 DIAGNOSIS — R0602 Shortness of breath: Secondary | ICD-10-CM

## 2019-08-14 DIAGNOSIS — N390 Urinary tract infection, site not specified: Secondary | ICD-10-CM | POA: Diagnosis present

## 2019-08-14 DIAGNOSIS — R4701 Aphasia: Secondary | ICD-10-CM | POA: Diagnosis not present

## 2019-08-14 DIAGNOSIS — E66813 Obesity, class 3: Secondary | ICD-10-CM

## 2019-08-14 LAB — CBC WITH DIFFERENTIAL/PLATELET
Abs Immature Granulocytes: 0.09 10*3/uL — ABNORMAL HIGH (ref 0.00–0.07)
Abs Immature Granulocytes: 0.07 10*3/uL (ref 0.00–0.07)
Abs Immature Granulocytes: 0.08 10*3/uL — ABNORMAL HIGH (ref 0.00–0.07)
Basophils Absolute: 0 10*3/uL (ref 0.0–0.1)
Basophils Absolute: 0 10*3/uL (ref 0.0–0.1)
Basophils Absolute: 0 10*3/uL (ref 0.0–0.1)
Basophils Relative: 1 %
Basophils Relative: 1 %
Basophils Relative: 1 %
Eosinophils Absolute: 0.1 10*3/uL (ref 0.0–0.5)
Eosinophils Absolute: 0.2 10*3/uL (ref 0.0–0.5)
Eosinophils Absolute: 0.2 10*3/uL (ref 0.0–0.5)
Eosinophils Relative: 1 %
Eosinophils Relative: 3 %
Eosinophils Relative: 3 %
HCT: 27.1 % — ABNORMAL LOW (ref 36.0–46.0)
HCT: 26.7 % — ABNORMAL LOW (ref 36.0–46.0)
HCT: 26.6 % — ABNORMAL LOW (ref 36.0–46.0)
Hemoglobin: 8.6 g/dL — ABNORMAL LOW (ref 12.0–15.0)
Hemoglobin: 8.1 g/dL — ABNORMAL LOW (ref 12.0–15.0)
Hemoglobin: 8.3 g/dL — ABNORMAL LOW (ref 12.0–15.0)
Immature Granulocytes: 1 %
Immature Granulocytes: 1 %
Immature Granulocytes: 1 %
Lymphocytes Relative: 19 %
Lymphocytes Relative: 21 %
Lymphocytes Relative: 24 %
Lymphs Abs: 1.6 10*3/uL (ref 0.7–4.0)
Lymphs Abs: 1.4 10*3/uL (ref 0.7–4.0)
Lymphs Abs: 1.6 10*3/uL (ref 0.7–4.0)
MCH: 29.1 pg (ref 26.0–34.0)
MCH: 28.5 pg (ref 26.0–34.0)
MCH: 28.9 pg (ref 26.0–34.0)
MCHC: 31.7 g/dL (ref 30.0–36.0)
MCHC: 30.3 g/dL (ref 30.0–36.0)
MCHC: 31.2 g/dL (ref 30.0–36.0)
MCV: 91.6 fL (ref 80.0–100.0)
MCV: 94 fL (ref 80.0–100.0)
MCV: 92.7 fL (ref 80.0–100.0)
Monocytes Absolute: 0.7 10*3/uL (ref 0.1–1.0)
Monocytes Absolute: 0.7 10*3/uL (ref 0.1–1.0)
Monocytes Absolute: 0.9 10*3/uL (ref 0.1–1.0)
Monocytes Relative: 8 %
Monocytes Relative: 11 %
Monocytes Relative: 13 %
Neutro Abs: 5.8 10*3/uL (ref 1.7–7.7)
Neutro Abs: 4.3 10*3/uL (ref 1.7–7.7)
Neutro Abs: 4 10*3/uL (ref 1.7–7.7)
Neutrophils Relative %: 70 %
Neutrophils Relative %: 63 %
Neutrophils Relative %: 58 %
Platelets: 322 10*3/uL (ref 150–400)
Platelets: 305 10*3/uL (ref 150–400)
Platelets: 315 10*3/uL (ref 150–400)
RBC: 2.96 MIL/uL — ABNORMAL LOW (ref 3.87–5.11)
RBC: 2.84 MIL/uL — ABNORMAL LOW (ref 3.87–5.11)
RBC: 2.87 MIL/uL — ABNORMAL LOW (ref 3.87–5.11)
RDW: 14.6 % (ref 11.5–15.5)
RDW: 14.6 % (ref 11.5–15.5)
RDW: 14.6 % (ref 11.5–15.5)
WBC: 8.3 10*3/uL (ref 4.0–10.5)
WBC: 6.7 10*3/uL (ref 4.0–10.5)
WBC: 6.8 10*3/uL (ref 4.0–10.5)
nRBC: 0 % (ref 0.0–0.2)
nRBC: 0.3 % — ABNORMAL HIGH (ref 0.0–0.2)
nRBC: 0 % (ref 0.0–0.2)

## 2019-08-14 LAB — URINE DRUG SCREEN, QUALITATIVE (ARMC ONLY)
Amphetamines, Ur Screen: NOT DETECTED
Barbiturates, Ur Screen: NOT DETECTED
Benzodiazepine, Ur Scrn: NOT DETECTED
Cannabinoid 50 Ng, Ur ~~LOC~~: NOT DETECTED
Cocaine Metabolite,Ur ~~LOC~~: NOT DETECTED
MDMA (Ecstasy)Ur Screen: NOT DETECTED
Methadone Scn, Ur: NOT DETECTED
Opiate, Ur Screen: NOT DETECTED
Phencyclidine (PCP) Ur S: NOT DETECTED
Tricyclic, Ur Screen: NOT DETECTED

## 2019-08-14 LAB — GLUCOSE, CAPILLARY
Glucose-Capillary: 184 mg/dL — ABNORMAL HIGH (ref 70–99)
Glucose-Capillary: 140 mg/dL — ABNORMAL HIGH (ref 70–99)
Glucose-Capillary: 166 mg/dL — ABNORMAL HIGH (ref 70–99)
Glucose-Capillary: 161 mg/dL — ABNORMAL HIGH (ref 70–99)
Glucose-Capillary: 147 mg/dL — ABNORMAL HIGH (ref 70–99)

## 2019-08-14 LAB — HIV ANTIBODY (ROUTINE TESTING W REFLEX): HIV Screen 4th Generation wRfx: NONREACTIVE

## 2019-08-14 LAB — CBC
HCT: 26.7 % — ABNORMAL LOW (ref 36.0–46.0)
Hemoglobin: 8.3 g/dL — ABNORMAL LOW (ref 12.0–15.0)
MCH: 28.6 pg (ref 26.0–34.0)
MCHC: 31.1 g/dL (ref 30.0–36.0)
MCV: 92.1 fL (ref 80.0–100.0)
Platelets: 296 10*3/uL (ref 150–400)
RBC: 2.9 MIL/uL — ABNORMAL LOW (ref 3.87–5.11)
RDW: 14.6 % (ref 11.5–15.5)
WBC: 6.3 10*3/uL (ref 4.0–10.5)
nRBC: 0 % (ref 0.0–0.2)

## 2019-08-14 LAB — IRON AND TIBC
Iron: 32 ug/dL (ref 28–170)
Saturation Ratios: 17 % (ref 10.4–31.8)
TIBC: 193 ug/dL — ABNORMAL LOW (ref 250–450)
UIBC: 161 ug/dL

## 2019-08-14 LAB — URINALYSIS, ROUTINE W REFLEX MICROSCOPIC
Bilirubin Urine: NEGATIVE
Glucose, UA: >=500 mg/dL — AB
Ketones, ur: NEGATIVE mg/dL
Nitrite: NEGATIVE
Protein, ur: 100 mg/dL — AB
Specific Gravity, Urine: 1.005 (ref 1.005–1.030)
WBC, UA: >50 WBC/hpf — ABNORMAL HIGH (ref 0–5)
pH: 5 (ref 5.0–8.0)

## 2019-08-14 LAB — LIPID PANEL
Cholesterol: 187 mg/dL (ref 0–200)
HDL: 35 mg/dL — ABNORMAL LOW (ref >40)
LDL Cholesterol: 136 mg/dL — ABNORMAL HIGH (ref 0–99)
Total CHOL/HDL Ratio: 5.3 RATIO
Triglycerides: 82 mg/dL (ref <150)
VLDL: 16 mg/dL (ref 0–40)

## 2019-08-14 LAB — VITAMIN B12: Vitamin B-12: 1373 pg/mL — ABNORMAL HIGH (ref 180–914)

## 2019-08-14 LAB — FOLATE: Folate: 10.8 ng/mL (ref >5.9)

## 2019-08-14 LAB — HEMOGLOBIN A1C
Hgb A1c MFr Bld: 8.3 % — ABNORMAL HIGH (ref 4.8–5.6)
Mean Plasma Glucose: 191.51 mg/dL

## 2019-08-14 MED ORDER — BUSPIRONE HCL 10 MG PO TABS
10.0000 mg | ORAL_TABLET | Freq: Three times a day (TID) | ORAL | Status: DC
  Administered 2019-08-14 – 2019-08-15 (×3): 10 mg via ORAL
  Filled 2019-08-14 (×5): qty 1

## 2019-08-14 MED ORDER — EXENATIDE ER 2 MG/0.85ML ~~LOC~~ AUIJ: 2.0000 mg | AUTO-INJECTOR | SUBCUTANEOUS | Status: DC

## 2019-08-14 MED ORDER — VITAMIN D 25 MCG (1000 UNIT) PO TABS
1000.0000 [IU] | ORAL_TABLET | Freq: Every day | ORAL | Status: DC
  Administered 2019-08-14 – 2019-08-15 (×2): 1000 [IU] via ORAL
  Filled 2019-08-14 (×2): qty 1

## 2019-08-14 MED ORDER — BISACODYL 5 MG PO TBEC: 10.0000 mg | DELAYED_RELEASE_TABLET | Freq: Every day | ORAL | Status: DC | PRN

## 2019-08-14 MED ORDER — PERPHENAZINE 2 MG PO TABS
1.0000 mg | ORAL_TABLET | Freq: Two times a day (BID) | ORAL | Status: DC
  Administered 2019-08-14 – 2019-08-15 (×2): 1 mg via ORAL
  Filled 2019-08-14 (×2): qty 1

## 2019-08-14 MED ORDER — FLUCONAZOLE 100 MG PO TABS
100.0000 mg | ORAL_TABLET | Freq: Every day | ORAL | Status: DC
  Administered 2019-08-14: 100 mg via ORAL
  Filled 2019-08-14: qty 1

## 2019-08-14 MED ORDER — SODIUM CHLORIDE 0.9 % IV SOLN
2.0000 g | INTRAVENOUS | Status: DC
  Administered 2019-08-14 – 2019-08-15 (×2): 2 g via INTRAVENOUS
  Filled 2019-08-14: qty 20
  Filled 2019-08-14: qty 2

## 2019-08-14 MED ORDER — IPRATROPIUM-ALBUTEROL 0.5-2.5 (3) MG/3ML IN SOLN: 3.0000 mL | RESPIRATORY_TRACT | Status: DC | PRN

## 2019-08-14 MED ORDER — HYDROCODONE-ACETAMINOPHEN 5-325 MG PO TABS: 1.0000 | ORAL_TABLET | Freq: Four times a day (QID) | ORAL | Status: DC | PRN

## 2019-08-14 MED ORDER — ETHACRYNIC ACID 25 MG PO TABS
75.0000 mg | ORAL_TABLET | Freq: Two times a day (BID) | ORAL | Status: DC
  Administered 2019-08-14 – 2019-08-15 (×3): 75 mg via ORAL
  Filled 2019-08-14 (×4): qty 3

## 2019-08-14 MED ORDER — PANTOPRAZOLE SODIUM 40 MG PO TBEC
40.0000 mg | DELAYED_RELEASE_TABLET | Freq: Every day | ORAL | Status: DC
  Administered 2019-08-14 – 2019-08-15 (×2): 40 mg via ORAL
  Filled 2019-08-14 (×2): qty 1

## 2019-08-14 MED ORDER — BENZTROPINE MESYLATE 1 MG PO TABS
1.0000 mg | ORAL_TABLET | Freq: Two times a day (BID) | ORAL | Status: DC
  Administered 2019-08-14 – 2019-08-15 (×2): 1 mg via ORAL
  Filled 2019-08-14 (×4): qty 1

## 2019-08-14 MED ORDER — POLYETHYLENE GLYCOL 3350 17 G PO PACK
17.0000 g | PACK | Freq: Every day | ORAL | Status: DC
  Administered 2019-08-14: 17:00:00 17 g via ORAL
  Filled 2019-08-14: qty 1

## 2019-08-14 MED ORDER — ENOXAPARIN SODIUM 40 MG/0.4ML ~~LOC~~ SOLN
40.0000 mg | Freq: Two times a day (BID) | SUBCUTANEOUS | Status: DC
  Filled 2019-08-14 (×2): qty 0.4

## 2019-08-14 MED ORDER — DOCUSATE SODIUM 100 MG PO CAPS
100.0000 mg | ORAL_CAPSULE | Freq: Every day | ORAL | Status: DC
  Administered 2019-08-14 – 2019-08-15 (×2): 100 mg via ORAL
  Filled 2019-08-14 (×2): qty 1

## 2019-08-14 MED ORDER — NITROGLYCERIN 0.4 MG SL SUBL: 0.4000 mg | SUBLINGUAL_TABLET | SUBLINGUAL | Status: DC | PRN

## 2019-08-14 MED ORDER — ASPIRIN 325 MG PO TABS: 325.0000 mg | ORAL_TABLET | Freq: Every day | ORAL | Status: DC

## 2019-08-14 MED ORDER — VITAMIN B-12 1000 MCG PO TABS
1000.0000 ug | ORAL_TABLET | ORAL | Status: DC
  Filled 2019-08-14: qty 1

## 2019-08-14 MED ORDER — MELATONIN 5 MG PO TABS
5.0000 mg | ORAL_TABLET | Freq: Every day | ORAL | Status: DC
  Administered 2019-08-14: 22:00:00 5 mg via ORAL
  Filled 2019-08-14 (×2): qty 1

## 2019-08-14 MED ORDER — SALINE SPRAY 0.65 % NA SOLN
2.0000 | Freq: Three times a day (TID) | NASAL | Status: DC | PRN
  Filled 2019-08-14: qty 44

## 2019-08-14 MED ORDER — VITAMIN B-12 1000 MCG PO TABS
1000.0000 ug | ORAL_TABLET | Freq: Every day | ORAL | Status: DC
  Filled 2019-08-14 (×2): qty 1

## 2019-08-14 MED ORDER — FLUTICASONE PROPIONATE 50 MCG/ACT NA SUSP
1.0000 | Freq: Two times a day (BID) | NASAL | Status: DC | PRN
  Filled 2019-08-14: qty 16

## 2019-08-14 MED ORDER — DOCUSATE SODIUM 100 MG PO CAPS: 100.0000 mg | ORAL_CAPSULE | Freq: Three times a day (TID) | ORAL | Status: DC | PRN

## 2019-08-14 MED ORDER — PERFLUTREN LIPID MICROSPHERE
1.0000 mL | INTRAVENOUS | Status: AC | PRN
  Administered 2019-08-14: 2 mL via INTRAVENOUS
  Filled 2019-08-14: qty 10

## 2019-08-14 MED ORDER — HYDRALAZINE HCL 25 MG PO TABS
25.0000 mg | ORAL_TABLET | Freq: Four times a day (QID) | ORAL | Status: DC
  Administered 2019-08-14 – 2019-08-15 (×5): 25 mg via ORAL
  Filled 2019-08-14 (×6): qty 1

## 2019-08-14 NOTE — Consult Note (Addendum)
WOC Nurse Consult Note: Reason for Consult: LE wound; cared for in SNF Wound type:venous stasis; LLE lateral calf  Pressure Injury POA: NA Measurement: area of involvement LLE 8cm x 6cm x 0.1cm  Wound OQU:CLTVT, pink, partial thickness Drainage (amount, consistency, odor) none Periwound:intact, minimal edema  Dressing procedure/placement/frequency: Continued POC established in SNF; clean with saline, pat dry. Cover with xeroform gauze, ABD pad and secure with tape. Change every other day.   Discussed POC with patient and bedside nurse.  Re consult if needed, will not follow at this time. Thanks  Huie Ghuman [employer], RN,CWOCN, CNS, [place] [phone])

## 2019-08-14 NOTE — Progress Notes (Signed)
SLP Cancellation Note  Patient Details Name: Gabrielle [name] MRN: [id number] DOB: 1950/11/03   Cancelled treatment:       Reason Eval/Treat Not Completed: SLP screened, no needs identified, will sign off (chart reviewed; consulted NSG then met w/ pt). Pt denied any difficulty swallowing and is currently on a regular diet; tolerates swallowing pills w/ water per NSG. Pt had just finished her breakfast meal. Pt conversed in conversation w/out deficits noted; pt denied any speech-language deficits stating she was "back to normal now". No further skilled ST services indicated as pt appears at her baseline. Pt agreed. NSG to reconsult if any change in status, or any further needs.     Orinda Kenner, MS, CCC-SLP Deliana Avalos 08/14/2019, 9:14 AM

## 2019-08-14 NOTE — Progress Notes (Signed)
Cross Cover Brief Note  Nurse reports patient with hematuria with clots Patient hemodynamically stable. Hematuria with significant decrease in hemoglobin from baseline 8.4, was 11.7 in 06/2017.  Patient denies history of kidney stones or flank pain.  She does reports passing clots with urination a couple weeks ago but it had stopped.  Patient is also on jardiance.  Renal function  2.27 today, may need to discontinue jardiance therapy. She reports that a couple of weeks ago when her pharmacy had ran out of her jardiance she experienced chest pain that she had to take nitroglycerin for. UA with indicators for UTI and yeast infection.  Patient started on  Rocephin and diflucan.  Follow up cultures.  Serial CBC Continue lovenox for now CT abd/pelvis wo cotrast - discussed with radiologist

## 2019-08-14 NOTE — Progress Notes (Signed)
OT Cancellation Note  Patient Details Name: Gabrielle [name] MRN: [id number] DOB: 06/18/1950   Cancelled Treatment:    Reason Eval/Treat Not Completed: OT screened, no needs identified, will sign off. Consult received and chart reviewed. Per chart and care team, patient presenting symptoms have fully resolved and patient has returned to baseline. Per discussion with physical therapist, Pt is dependant care at facility (hoyer lift for transfers, but recently bedbound due to LE wounds); voices "just not interested in therapy". No acute OT needs identified at this time; patient declining/not interested as well. Will complete initial order at this time. Please re-consult should needs change.  Shara Blazing, M.S., OTR/L Ascom: [phone] 08/14/19, 10:02 AM

## 2019-08-14 NOTE — Progress Notes (Addendum)
PROGRESS NOTE    Gabrielle [name]   OVZ:[id number]  DOB: 1950/08/26  PCP: [name], MD    DOA: 08/13/2019 LOS: 1   Brief Narrative   Gabrielle [name] is a 69 y.o. 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  This occurred upon waking from a nap.  She also describes similar episodes recently upon waking in the morning.  She notes that its difficulty to speak, describes her tongue feeling thick right upper extremity feeling heavy her usual.  She does have a history of carpal tunnel requiring splints.  She reported associated vision seen again.  These episodes have consistently occurred upon waking from sleep.  Her symptoms resolved in the ED prior to admission.  Admission for evaluation of possible TIA and was also found to have a UTI on empiric IV Rocephin.  Assessment & Plan   Principal Problem:   Aphasia Active Problems:   Diabetes ([place])   Acute on chronic kidney failure (HCC)   COPD with chronic bronchitis (HCC)   OSA (obstructive sleep apnea)   BMI 50.0-59.9, adult (HCC)   UTI (urinary tract infection)   Aphasia and right upper extremity weakness -with episodes consistently occurring after sleep, suspect this could be related to patient's poorly controlled OSA. CT head was negative.  Unable to get CTA due to renal insufficiency.  MRI was negative for acute findings but showed chronic small vessel disease.  Echo and carotid duplex are pending.  Started on Plavix and statin per neurology.  Recommend control of her OSA, will investigate Trilogy NIV as an option.  Acute cystitis with hematuria -present on admission.  Currently on empiric Rocephin pending cultures.  Patient is having recurrent UTIs on Jardiance which I have discontinued.  Holding her full dose aspirin and prophylactic Lovenox  due to hematuria.  AKI ?  Superimposed on CKD -creatinine 2.27 on admission, baseline is unknown.  Treated with IV fluids which had been discontinued to prevent volume overload as patient likely has right heart failure due to her uncontrolled OSA and COPD.  BMP in AM.  Chronic respiratory failure with hypoxia / COPD -exacerbated medications on her baseline 2 L/min supplemental oxygen and home inhalers. Duonebs PRN.  Obstructive sleep apnea -patient is intolerant to CPAP or BiPAP.  She notes waking up with some confusion and recently with difficulty speaking and right upper extremity weakness/paresthesias.  Unclear if she will tolerate trilogy, will see if we can get her qualified.  Strongly advised outpatient follow-up with sleep specialist OSA.  Insulin-dependent type 2 diabetes -takes 43 units of Lantus BID, Humalog 5 units with meals, pain scale.  She has reduced p.o. intake so currently covering with resistant sliding scale NovoLog.  Essential hypertension -resume home hydralazine and ethacrynic acid.  Vitamin D deficiency -resume home supplement  Allergic rhinitis -continue home Flonase  GERD -continue home PPI  Chronic constipation -continued on her home bowel regimen  Morbid obesity: Body mass index is 55.83 kg/m.  Complicates overall care and prognosis.  DVT prophylaxis: enoxaparin (LOVENOX) injection 40 mg Start: 08/14/19 2200   Diet:  Diet Orders (From admission, onward)    Start     Ordered   08/14/19 0317  Diet heart healthy/carb modified Room service appropriate? Yes; Fluid consistency: Thin  Diet effective now       Question Answer  Comment  Diet-HS Snack? Nothing   Room service appropriate? Yes   Fluid consistency: Thin      08/14/19 0317            Code Status: Full Code    Subjective 08/14/19    Patient seen this morning at bedside.  She denies any recurrence of trouble speaking or right upper extremity symptoms.  She wants her Jardiance resumed in  addition to her other home medications.  We discussed UTIs as a contraindication to Jardiance but she states she wants to take it anyway because it helps her chest pain.  She reports reduced appetite, denies nausea or vomiting.  No acute events reported.   Disposition Plan & Communication   Status is: Observation  The patient remains OBS appropriate and will d/c before 2 midnights.    Dispo: The patient is from: SNF              Anticipated d/c is to: SNF              Anticipated d/c date is: 1 day              Patient currently is medically stable for discharge.  Unable to return to SNF this evening, expect d/c tomorrow.   Family Communication: None at bedside, will attempt to call   Consults, Procedures, Significant Events   Consultants:   Neurology  Procedures:   None  Antimicrobials:   Rocephin 7/6 >>   Objective   Vitals:   08/14/19 0755 08/14/19 1122 08/14/19 1300 08/14/19 1526  BP: (!) 144/85 (!) 142/80  140/71  Pulse: 98 89  89  Resp: 18 18  (!) 22  Temp: 97.8 F (36.6 C) 98.1 F (36.7 C)  98 F (36.7 C)  TempSrc: Oral Oral  Oral  SpO2: 100% 100%  100%  Weight:      Height:   5\' 6"  (1.676 m)     Intake/Output Summary (Last 24 hours) at 08/14/2019 1710 Last data filed at 08/14/2019 0855 Gross per 24 hour  Intake 500 ml  Output 1300 ml  Net -800 ml   Filed Weights   08/13/19 1945  Weight: (!) 156.9 kg    Physical Exam:  General exam: awake, alert, no acute distress, morbidly obese  Respiratory system: Decreased breath sounds due to body habitus generally clear, no wheezes or rhonchi, normal respiratory effort. Cardiovascular system: normal S1/S2, RRR, cannot appreciate JVD due to body habitus, no pedal edema.   Gastrointestinal system: soft, NT, ND, +bowel sounds. Central nervous system: A&O x3, normal speech, CN II through XII, bilateral upper extremities with symmetric motor and sensation Extremities: moves all, no cyanosis, normal tone Skin:  dry, intact, normal temperature, normal color Psychiatry: Anxious mood, congruent affect  Labs   Data Reviewed: I have personally reviewed following labs and imaging studies  CBC: Recent Labs  Lab 08/13/19 2006 08/14/19 0452 08/14/19 0950 08/14/19 1621  WBC 13.2* 8.3 6.7 6.8  NEUTROABS 10.6* 5.8 4.3 4.0  HGB 8.4* 8.6* 8.1* 8.3*  HCT 27.3* 27.1* 26.7* 26.6*  MCV 93.5 91.6 94.0 92.7  PLT 327 322 305 211   Basic Metabolic Panel: Recent Labs  Lab 08/13/19 2006  NA 137  K 3.9  CL 93*  CO2 31  GLUCOSE 174*  BUN 32*  CREATININE 2.27*  CALCIUM 8.6*   GFR: Estimated Creatinine Clearance: 37.8 mL/min (A) (by C-G formula based on SCr of 2.27 mg/dL (H)). Liver Function Tests: Recent Labs  Lab  08/13/19 2006  AST 10*  ALT 8  ALKPHOS 54  BILITOT 1.1  PROT 7.4  ALBUMIN 2.5*   No results for input(s): LIPASE, AMYLASE in the last 168 hours. No results for input(s): AMMONIA in the last 168 hours. Coagulation Profile: Recent Labs  Lab 08/13/19 2006  INR 1.1   Cardiac Enzymes: No results for input(s): CKTOTAL, CKMB, CKMBINDEX, TROPONINI in the last 168 hours. BNP (last 3 results) No results for input(s): PROBNP in the last 8760 hours. HbA1C: Recent Labs    08/14/19 0452  HGBA1C 8.3*   CBG: Recent Labs  Lab 08/14/19 0142 08/14/19 0758 08/14/19 1147 08/14/19 1539  GLUCAP 184* 140* 166* 161*   Lipid Profile: Recent Labs    08/14/19 0452  CHOL 187  HDL 35*  LDLCALC 136*  TRIG 82  CHOLHDL 5.3   Thyroid Function Tests: No results for input(s): TSH, T4TOTAL, FREET4, T3FREE, THYROIDAB in the last 72 hours. Anemia Panel: Recent Labs    08/14/19 0452  VITAMINB12 1,373*  FOLATE 10.8  TIBC 193*  IRON 32   Sepsis Labs: No results for input(s): PROCALCITON, LATICACIDVEN in the last 168 hours.  Recent Results (from the past 240 hour(s))  SARS Coronavirus 2 by RT PCR (hospital order, performed in [facility] hospital lab) Nasopharyngeal Nasopharyngeal Swab      Status: None   Collection Time: 08/13/19  9:44 PM   Specimen: Nasopharyngeal Swab  Result Value Ref Range Status   SARS Coronavirus 2 NEGATIVE NEGATIVE Final    Comment: (NOTE) SARS-CoV-2 target nucleic acids are NOT DETECTED.  The SARS-CoV-2 RNA is generally detectable in upper and lower respiratory specimens during the acute phase of infection. The lowest concentration of SARS-CoV-2 viral copies this assay can detect is 250 copies / mL. A negative result does not preclude SARS-CoV-2 infection and should not be used as the sole basis for treatment or other patient management decisions.  A negative result may occur with improper specimen collection / handling, submission of specimen other than nasopharyngeal swab, presence of viral mutation(s) within the areas targeted by this assay, and inadequate number of viral copies (<250 copies / mL). A negative result must be combined with clinical observations, patient history, and epidemiological information.  Fact Sheet for Patients:   StrictlyIdeas.no  Fact Sheet for Healthcare Providers: BankingDealers.co.za  This test is not yet approved or  cleared by the [place] FDA and has been authorized for detection and/or diagnosis of SARS-CoV-2 by FDA under an Emergency Use Authorization (EUA).  This EUA will remain in effect (meaning this test can be used) for the duration of the COVID-19 declaration under Section 564(b)(1) of the Act, 21 U.S.C. section 360bbb-3(b)(1), unless the authorization is terminated or revoked sooner.  Performed at [facility], [address] [place], [place] [address]       Imaging Studies   CT ABDOMEN PELVIS WO CONTRAST  Result Date: 08/14/2019 CLINICAL DATA:  Hematuria with unknown cause EXAM: CT ABDOMEN AND PELVIS WITHOUT CONTRAST TECHNIQUE: Multidetector CT imaging of the abdomen and pelvis was performed following the standard protocol  without IV contrast. COMPARISON:  None. FINDINGS: Lower chest: Low-density blood pool suggesting anemia. Small left pleural effusion with left lower lobe atelectasis and patchy ground-glass opacity at the right base. Hepatobiliary: Geographic low-density at the central liver attributed to steatosis.Cholelithiasis. No inflammatory changes or bile duct dilatation. Hepatoduodenal calcifications are nodal. Pancreas: Unremarkable. Spleen: Unremarkable. Adrenals/Urinary Tract: Negative adrenals. Symmetric perinephric stranding which is nonspecific. Negative for urolithiasis  or hydronephrosis. Thick walled bladder with indistinct adjacent fat, further assessment limited by collapse around a Foley catheter. Stomach/Bowel:  No obstruction. No visible bowel inflammation. Vascular/Lymphatic: Diffuse atherosclerotic plaque. No mass or adenopathy. Reproductive:No pathologic findings. Other: No ascites or pneumoperitoneum. Musculoskeletal: Ovoid simple lipoma medial to the proximal left femur. Diffuse muscular atrophy and spinal degeneration. IMPRESSION: 1. Thick walled bladder with adjacent fat stranding suggesting cystitis. Bladder assessment is limited due to collapse around the Foley catheter. 2. No hydronephrosis or urolithiasis. 3. Cholelithiasis. 4. Small left pleural effusion with atelectasis. Airspace disease at the right base which appears inflammatory, please correlate for pneumonia. Electronically Signed   By: Monte Fantasia M.D.   On: 08/14/2019 05:23   MR BRAIN WO CONTRAST  Result Date: 08/14/2019 CLINICAL DATA:  Speech difficulty EXAM: MRI HEAD WITHOUT CONTRAST TECHNIQUE: Multiplanar, multiecho pulse sequences of the brain and surrounding structures were obtained without intravenous contrast. COMPARISON:  None. FINDINGS: Brain: No acute infarct, acute hemorrhage or extra-axial collection. Multifocal white matter hyperintensity, most commonly due to chronic ischemic microangiopathy. Normal volume of CSF spaces.  No chronic microhemorrhage. Normal midline structures. Vascular: Normal flow voids. Skull and upper cervical spine: Normal marrow signal. Sinuses/Orbits: Negative. Other: None. IMPRESSION: 1. No acute intracranial abnormality. 2. Multifocal white matter hyperintensity, most commonly due to chronic ischemic microangiopathy. Electronically Signed   By: Ulyses Jarred M.D.   On: 08/14/2019 03:01   DG Chest Portable 1 View  Result Date: 08/13/2019 CLINICAL DATA:  Altered mental status EXAM: PORTABLE CHEST 1 VIEW COMPARISON:  05/02/2017 FINDINGS: Mild cardiomegaly. No focal opacity or pleural effusion. No pneumothorax. IMPRESSION: No active disease. Mild cardiomegaly. Electronically Signed   By: Donavan Foil M.D.   On: 08/13/2019 20:37   CT HEAD CODE STROKE WO CONTRAST  Result Date: 08/13/2019 CLINICAL DATA:  Code stroke. Slurred speech. Visual disturbance. Right arm numbness and weakness beginning 1730 hours. EXAM: CT HEAD WITHOUT CONTRAST TECHNIQUE: Contiguous axial images were obtained from the base of the skull through the vertex without intravenous contrast. COMPARISON:  None. FINDINGS: Brain: Moderate brain atrophy. Mild chronic small-vessel change of the hemispheric white matter. No sign of acute infarction, mass lesion, hemorrhage, hydrocephalus or extra-axial collection. Vascular: There is atherosclerotic calcification of the major vessels at the base of the brain. Skull: Negative Sinuses/Orbits: Clear/normal Other: None ASPECTS ([place] Stroke Program Early CT Score) - Ganglionic level infarction (caudate, lentiform nuclei, internal capsule, insula, M1-M3 cortex): 7 - Supraganglionic infarction (M4-M6 cortex): 3 Total score (0-10 with 10 being normal): 10 IMPRESSION: 1. No acute CT finding. Atrophy and mild chronic small-vessel change of the hemispheric white matter. 2. ASPECTS is 10. 3. These results were called by telephone at the time of interpretation on 08/13/2019 at 8:34 pm to provider Duffy Bruce  , who verbally acknowledged these results. Electronically Signed   By: Nelson Chimes M.D.   On: 08/13/2019 20:35     Medications   Scheduled Meds:  aspirin EC  81 mg Oral Daily   benztropine  1 mg Oral BID   busPIRone  10 mg Oral TID   cholecalciferol  1,000 Units Oral Daily   docusate sodium  100 mg Oral Daily   enoxaparin (LOVENOX) injection  40 mg Subcutaneous Q12H   ethacrynic acid  75 mg Oral BID   hydrALAZINE  25 mg Oral Q6H   insulin aspart  0-20 Units Subcutaneous TID WC   insulin aspart  0-5 Units Subcutaneous QHS   melatonin  5 mg Oral QHS  pantoprazole  40 mg Oral Daily   perphenazine  1 mg Oral BID   polyethylene glycol  17 g Oral Daily   vitamin B-12  1,000 mcg Oral Daily   vitamin B-12  1,000 mcg Oral Q M,W,F   Continuous Infusions:  sodium chloride 75 mL/hr at 08/14/19 0502   cefTRIAXone (ROCEPHIN)  IV 2 g (08/14/19 0504)       LOS: 1 day    Time spent: 38 minutes with greater than 50% spent in coordination of care in direct patient contact.    Ezekiel Slocumb, DO [facility]  08/14/2019, 5:10 PM    If 7PM-7AM, please contact night-coverage. How to contact the [facility] Attending or Consulting provider [place] or covering provider during after hours [place], for this patient?    1. Check the care team in [facility] and look for a) attending/consulting [facility] provider listed and b) the [facility] team listed 2. Log into [email or web address] and use [place]'s universal password to access. If you do not have the password, please contact the hospital operator. 3. Locate the [facility] provider you are looking for under [facility] and page to a number that you can be directly reached. 4. If you still have difficulty reaching the provider, please page the [facility] (Director on Call) for the Hospitalists listed on amion for assistance.

## 2019-08-14 NOTE — Progress Notes (Signed)
PHARMACIST - PHYSICIAN COMMUNICATION  CONCERNING:  Enoxaparin (Lovenox) for DVT Prophylaxis   RECOMMENDATION: Patient was prescribed enoxaprin 40mg  q24 hours for VTE prophylaxis.   Filed Weights   08/13/19 1945  Weight: (!) 156.9 kg (345 lb 14.4 oz)   Body mass index is 55.83 kg/m.  Estimated Creatinine Clearance: 37.8 mL/min (A) (by C-G formula based on SCr of 2.27 mg/dL (H)).  Based on [place] patient is candidate for enoxaparin 40mg  every 12 hour dosing due to BMI being >40.  DESCRIPTION: Pharmacy has adjusted enoxaparin dose per [facility] policy.  Patient is now receiving enoxaparin 40mg  every 12 hours.   Pernell Dupre, PharmD, BCPS Clinical Pharmacist 08/14/2019 1:31 PM

## 2019-08-14 NOTE — Progress Notes (Signed)
PT Cancellation Note  Patient Details Name: Gabrielle [name] MRN: [id number] DOB: 1950/02/17   Cancelled Treatment:    Reason Eval/Treat Not Completed: PT screened, no needs identified, will sign off (Consult received and chart reviewed.  Per chart and care team, patient presenting symptoms have fully resolved and patient has returned to baseline.  Per discussion with patient, she is dependant care at facility (hoyer lift for transfers, but recently bedbound due to LE wounds); voices "just not interested in therapy".  Confirmed baseline status with facility, confirms use of hoyer for any transfers and dependant care otherwise.  No acute PT needs identified at this time; patient declining/not interested as well.  Will complete initial order at this time.  Please re-consult should needs change.)   Elissia Spiewak H. Owens Shark, PT, DPT, NCS 08/14/19, 9:42 AM [phone]

## 2019-08-14 NOTE — Consult Note (Signed)
Requesting Physician: Arbutus Ped    Chief Complaint: Right sided weakness, difficulty with speech  I have been asked by Dr. Arbutus Ped to see this patient in consultation for TIA.  HPI: Gabrielle [name] is an 69 y.o. 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 Patient reports that she took a nap at about 5 PM on 7/6 and was awakened at around 6:30 PM.  When she woke up she had difficulty with her speech and blurred vision.  Also felt some heaviness in her RUE with some change in sensation in he RLE.  Patient brought in for evaluation at that time.  Initial NIHSS of 2.  Patient had a previous similar episode the say prior.  After a few hours her symptoms resolved and she did not seek medical attention.  Current presenting symptoms have now resolved as well.  Consult called for further recommendations.    Date last known well: 08/13/2019 Time last known well: Time: 17:00 tPA Given: No: Resolution of symptoms  Past Medical History:  Diagnosis Date  . Allergy    seasonal allergies  . Arthritis   . Chronic kidney disease    she reports grade 2 kidney disease  . Chronic venous stasis dermatitis of both lower extremities   . Diabetes mellitus without complication ([place])   . Hypertension   . Morbid obesity ([place])   . Osteoarthritis   . Oxygen dependent   . Skin cancer   . Sleep apnea     Past Surgical History:  Procedure Laterality Date  . NO PAST SURGERIES    . none      Family History  Problem Relation Age of Onset  . Diabetes Mellitus II Father   . CAD Father   . CAD Mother   . Hiatal hernia Mother    Social History:  reports that she has never smoked. She has never used smokeless tobacco. She reports that she does not drink alcohol and does not use drugs.  Allergies:  Allergies  Allergen Reactions  . Lasix  [Furosemide]     Numbness and tingling of the face   . Ultram [Tramadol] Other (See Comments)    Pt reports, "makes my head feel funny."  . Erythromycin Rash  . Plasticized Base [Plastibase] Rash  . Tetracyclines & Related Rash    Medications:  I have reviewed the patient's current medications. Prior to Admission:  Medications Prior to Admission  Medication Sig Dispense Refill Last Dose  . acetaminophen (TYLENOL) 500 MG tablet Take 500 mg by mouth 3 (three) times daily.     Marland Kitchen aspirin 325 MG tablet Take 325 mg by mouth daily.     . benztropine (COGENTIN) 1 MG tablet Take 1 mg by mouth 2 (two) times daily.      . busPIRone (BUSPAR) 10 MG tablet Take 10 mg by mouth 3 (three) times daily.      . ciclopirox (LOPROX) 0.77 % SUSP Apply 1 application topically daily.     . clobetasol cream (TEMOVATE) 1.76 % Apply 1 application topically in the morning and at bedtime. (apply     . ethacrynic acid (EDECRIN) 25 MG tablet Take 3 tablets (75 mg total) by mouth 2 (two) times daily. 60 tablet 0   . Exenatide ER (BYDUREON BCISE) 2 MG/0.85ML AUIJ Inject 2 mg into the skin every Wednesday.     Marland Kitchen  HUMALOG 100 UNIT/ML cartridge Inject 5 Units into the skin See admin instructions. Inject 5u under the skin three times daily with meals with sliding scale     . hydrALAZINE (APRESOLINE) 25 MG tablet Take 25 mg by mouth 4 (four) times daily.      . insulin glargine (LANTUS) 100 UNIT/ML injection Inject 0.85 mLs (85 Units total) into the skin daily. (Patient taking differently: Inject 43 Units into the skin 2 (two) times daily. ) 10 mL 11   . JARDIANCE 25 MG TABS tablet Take 25 mg by mouth daily.      Marland Kitchen perphenazine (TRILAFON) 2 MG tablet Take 2 mg by mouth at bedtime.      . Cholecalciferol (VITAMIN D3) 1000 units CAPS Take 1,000 Units by mouth daily.      Marland Kitchen docusate sodium (COLACE) 100 MG capsule Take 100 mg by mouth 2 (two) times daily.     . fluticasone (FLONASE) 50 MCG/ACT nasal spray Place 1 spray into both  nostrils as needed.      . Melatonin 1 MG TABS Take 3 mg by mouth. At bedtime     . nabumetone (RELAFEN) 500 MG tablet Take 1 tablet by mouth daily.      . polyethylene glycol (MIRALAX / GLYCOLAX) packet Take 17 g by mouth daily.     . vitamin B-12 (CYANOCOBALAMIN) 1000 MCG tablet Take 1,000 mcg by mouth daily.       Scheduled: . aspirin EC  81 mg Oral Daily  . enoxaparin (LOVENOX) injection  40 mg Subcutaneous Q24H  . ethacrynic acid  75 mg Oral BID  . fluconazole  100 mg Oral Daily  . hydrALAZINE  25 mg Oral Q6H  . insulin aspart  0-20 Units Subcutaneous TID WC  . insulin aspart  0-5 Units Subcutaneous QHS  . vitamin B-12  1,000 mcg Oral Daily    ROS: History obtained from the patient  General ROS: negative for - chills, fatigue, fever, night sweats, weight gain or weight loss Psychological ROS: negative for - behavioral disorder, hallucinations, memory difficulties, mood swings or suicidal ideation Ophthalmic ROS: negative for - blurry vision, double vision, eye pain or loss of vision ENT ROS: negative for - epistaxis, nasal discharge, oral lesions, sore throat, tinnitus or vertigo Allergy and Immunology ROS: negative for - hives or itchy/watery eyes Hematological and Lymphatic ROS: negative for - bleeding problems, bruising or swollen lymph nodes Endocrine ROS: negative for - galactorrhea, hair pattern changes, polydipsia/polyuria or temperature intolerance Respiratory ROS: negative for - cough, hemoptysis, shortness of breath or wheezing Cardiovascular ROS: negative for - chest pain, dyspnea on exertion, edema or irregular heartbeat Gastrointestinal ROS: negative for - abdominal pain, diarrhea, hematemesis, nausea/vomiting or stool incontinence Genito-Urinary ROS: negative for - dysuria, hematuria, incontinence or urinary frequency/urgency Musculoskeletal ROS: non-healing sore on left leg Neurological ROS: as noted in HPI Dermatological ROS: rash  Physical Examination: Blood  pressure (!) 144/85, pulse 98, temperature 97.8 F (36.6 C), temperature source Oral, resp. rate 18, weight (!) 156.9 kg, SpO2 100 %.  HEENT-  Normocephalic, no lesions, without obvious abnormality.  Normal external eye and conjunctiva.  Normal TM's bilaterally.  Normal auditory canals and external ears. Normal external nose, mucus membranes and septum.  Normal pharynx. Cardiovascular- S1, S2 normal, pulses palpable throughout   Lungs- chest clear, no wheezing, rales, normal symmetric air entry Abdomen- soft, non-tender; bowel sounds normal; no masses,  no organomegaly Extremities- dressing LLE Lymph-no adenopathy palpable Musculoskeletal-no joint tenderness, deformity or  swelling  Neurological Examination   Mental Status: Alert, oriented, thought content appropriate.  Speech fluent without evidence of aphasia.  Able to follow 3 step commands without difficulty. Cranial Nerves: II: Visual fields grossly normal, pupils equal, round, reactive to light and accommodation III,IV, VI: ptosis not present, extra-ocular motions intact bilaterally V,VII: smile symmetric, facial light touch sensation normal bilaterally VIII: hearing normal bilaterally IX,X: gag reflex present XI: bilateral shoulder shrug XII: midline tongue extension Motor: Right : Upper extremity   5/5    Left:     Upper extremity   5/5  Lower extremity   5/5     Lower extremity   5/5 Tone and bulk:normal tone throughout; no atrophy noted Sensory: Pinprick and light touch intact throughout, bilaterally Deep Tendon Reflexes: Symmetric throughout Plantars: Right: mute   Left: mute Cerebellar: Normal finger-to-nose testing bilaterally Gait: patient non-ambulatory   Laboratory Studies:  Basic Metabolic Panel: Recent Labs  Lab 08/13/19 2006  NA 137  K 3.9  CL 93*  CO2 31  GLUCOSE 174*  BUN 32*  CREATININE 2.27*  CALCIUM 8.6*    Liver Function Tests: Recent Labs  Lab 08/13/19 2006  AST 10*  ALT 8  ALKPHOS 54   BILITOT 1.1  PROT 7.4  ALBUMIN 2.5*   No results for input(s): LIPASE, AMYLASE in the last 168 hours. No results for input(s): AMMONIA in the last 168 hours.  CBC: Recent Labs  Lab 08/13/19 2006 08/14/19 0452 08/14/19 0950  WBC 13.2* 8.3 6.7  NEUTROABS 10.6* 5.8 4.3  HGB 8.4* 8.6* 8.1*  HCT 27.3* 27.1* 26.7*  MCV 93.5 91.6 94.0  PLT 327 322 305    Cardiac Enzymes: No results for input(s): CKTOTAL, CKMB, CKMBINDEX, TROPONINI in the last 168 hours.  BNP: Invalid input(s): POCBNP  CBG: Recent Labs  Lab 08/14/19 0142 08/14/19 0758  GLUCAP 184* 140*    Microbiology: Results for orders placed or performed during the hospital encounter of 08/13/19  SARS Coronavirus 2 by RT PCR (hospital order, performed in [facility] hospital lab) Nasopharyngeal Nasopharyngeal Swab     Status: None   Collection Time: 08/13/19  9:44 PM   Specimen: Nasopharyngeal Swab  Result Value Ref Range Status   SARS Coronavirus 2 NEGATIVE NEGATIVE Final    Comment: (NOTE) SARS-CoV-2 target nucleic acids are NOT DETECTED.  The SARS-CoV-2 RNA is generally detectable in upper and lower respiratory specimens during the acute phase of infection. The lowest concentration of SARS-CoV-2 viral copies this assay can detect is 250 copies / mL. A negative result does not preclude SARS-CoV-2 infection and should not be used as the sole basis for treatment or other patient management decisions.  A negative result may occur with improper specimen collection / handling, submission of specimen other than nasopharyngeal swab, presence of viral mutation(s) within the areas targeted by this assay, and inadequate number of viral copies (<250 copies / mL). A negative result must be combined with clinical observations, patient history, and epidemiological information.  Fact Sheet for Patients:   StrictlyIdeas.no  Fact Sheet for Healthcare  Providers: BankingDealers.co.za  This test is not yet approved or  cleared by the [place] FDA and has been authorized for detection and/or diagnosis of SARS-CoV-2 by FDA under an Emergency Use Authorization (EUA).  This EUA will remain in effect (meaning this test can be used) for the duration of the COVID-19 declaration under Section 564(b)(1) of the Act, 21 U.S.C. section 360bbb-3(b)(1), unless the authorization is terminated or revoked sooner.  Performed at [facility], [address] [place], [place] [address]     Coagulation Studies: Recent Labs    08/13/19 05/27/04  LABPROT 13.6  INR 1.1    Urinalysis:  Recent Labs  Lab 08/13/19 2328  COLORURINE YELLOW*  LABSPEC 1.005  PHURINE 5.0  GLUCOSEU >=500*  HGBUR LARGE*  BILIRUBINUR NEGATIVE  KETONESUR NEGATIVE  PROTEINUR 100*  NITRITE NEGATIVE  LEUKOCYTESUR LARGE*    Lipid Panel:    Component Value Date/Time   CHOL 187 08/14/2019 0452   TRIG 82 08/14/2019 0452   HDL 35 (L) 08/14/2019 0452   CHOLHDL 5.3 08/14/2019 0452   VLDL 16 08/14/2019 0452   LDLCALC 136 (H) 08/14/2019 0452    HgbA1C:  Lab Results  Component Value Date   HGBA1C 8.3 (H) 08/14/2019    Urine Drug Screen:      Component Value Date/Time   LABOPIA NONE DETECTED 08/13/2019 2328   COCAINSCRNUR NONE DETECTED 08/13/2019 2328   LABBENZ NONE DETECTED 08/13/2019 2328   AMPHETMU NONE DETECTED 08/13/2019 2328   THCU NONE DETECTED 08/13/2019 2328   LABBARB NONE DETECTED 08/13/2019 2326-05-28    Alcohol Level:  Recent Labs  Lab 08/13/19 2004/05/27  ETH <10    Other results: EKG: sinus tachycardia at 100 bpm.  Imaging: CT ABDOMEN PELVIS WO CONTRAST  Result Date: 08/14/2019 CLINICAL DATA:  Hematuria with unknown cause EXAM: CT ABDOMEN AND PELVIS WITHOUT CONTRAST TECHNIQUE: Multidetector CT imaging of the abdomen and pelvis was performed following the standard protocol without IV contrast. COMPARISON:  None. FINDINGS:  Lower chest: Low-density blood pool suggesting anemia. Small left pleural effusion with left lower lobe atelectasis and patchy ground-glass opacity at the right base. Hepatobiliary: Geographic low-density at the central liver attributed to steatosis.Cholelithiasis. No inflammatory changes or bile duct dilatation. Hepatoduodenal calcifications are nodal. Pancreas: Unremarkable. Spleen: Unremarkable. Adrenals/Urinary Tract: Negative adrenals. Symmetric perinephric stranding which is nonspecific. Negative for urolithiasis or hydronephrosis. Thick walled bladder with indistinct adjacent fat, further assessment limited by collapse around a Foley catheter. Stomach/Bowel:  No obstruction. No visible bowel inflammation. Vascular/Lymphatic: Diffuse atherosclerotic plaque. No mass or adenopathy. Reproductive:No pathologic findings. Other: No ascites or pneumoperitoneum. Musculoskeletal: Ovoid simple lipoma medial to the proximal left femur. Diffuse muscular atrophy and spinal degeneration. IMPRESSION: 1. Thick walled bladder with adjacent fat stranding suggesting cystitis. Bladder assessment is limited due to collapse around the Foley catheter. 2. No hydronephrosis or urolithiasis. 3. Cholelithiasis. 4. Small left pleural effusion with atelectasis. Airspace disease at the right base which appears inflammatory, please correlate for pneumonia. Electronically Signed   By: Monte Fantasia M.D.   On: 08/14/2019 05:23   MR BRAIN WO CONTRAST  Result Date: 08/14/2019 CLINICAL DATA:  Speech difficulty EXAM: MRI HEAD WITHOUT CONTRAST TECHNIQUE: Multiplanar, multiecho pulse sequences of the brain and surrounding structures were obtained without intravenous contrast. COMPARISON:  None. FINDINGS: Brain: No acute infarct, acute hemorrhage or extra-axial collection. Multifocal white matter hyperintensity, most commonly due to chronic ischemic microangiopathy. Normal volume of CSF spaces. No chronic microhemorrhage. Normal midline  structures. Vascular: Normal flow voids. Skull and upper cervical spine: Normal marrow signal. Sinuses/Orbits: Negative. Other: None. IMPRESSION: 1. No acute intracranial abnormality. 2. Multifocal white matter hyperintensity, most commonly due to chronic ischemic microangiopathy. Electronically Signed   By: Ulyses Jarred M.D.   On: 08/14/2019 03:01   DG Chest Portable 1 View  Result Date: 08/13/2019 CLINICAL DATA:  Altered mental status EXAM: PORTABLE CHEST 1 VIEW COMPARISON:  05/02/2017 FINDINGS: Mild cardiomegaly. No  focal opacity or pleural effusion. No pneumothorax. IMPRESSION: No active disease. Mild cardiomegaly. Electronically Signed   By: Donavan Foil M.D.   On: 08/13/2019 20:37   CT HEAD CODE STROKE WO CONTRAST  Result Date: 08/13/2019 CLINICAL DATA:  Code stroke. Slurred speech. Visual disturbance. Right arm numbness and weakness beginning 1730 hours. EXAM: CT HEAD WITHOUT CONTRAST TECHNIQUE: Contiguous axial images were obtained from the base of the skull through the vertex without intravenous contrast. COMPARISON:  None. FINDINGS: Brain: Moderate brain atrophy. Mild chronic small-vessel change of the hemispheric white matter. No sign of acute infarction, mass lesion, hemorrhage, hydrocephalus or extra-axial collection. Vascular: There is atherosclerotic calcification of the major vessels at the base of the brain. Skull: Negative Sinuses/Orbits: Clear/normal Other: None ASPECTS ([place] Stroke Program Early CT Score) - Ganglionic level infarction (caudate, lentiform nuclei, internal capsule, insula, M1-M3 cortex): 7 - Supraganglionic infarction (M4-M6 cortex): 3 Total score (0-10 with 10 being normal): 10 IMPRESSION: 1. No acute CT finding. Atrophy and mild chronic small-vessel change of the hemispheric white matter. 2. ASPECTS is 10. 3. These results were called by telephone at the time of interpretation on 08/13/2019 at 8:34 pm to provider Duffy Bruce , who verbally acknowledged these results.  Electronically Signed   By: Nelson Chimes M.D.   On: 08/13/2019 20:35    Assessment: 69 y.o. 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  Symptoms now resolved.  Both occurred after sleeping and may be TIA related to her poorly controlled OSA or may be related to her poorly controlled OSA solely.  MRI of the brain personally reviewed and shows no acute changes.  Echocardiogram is pending.  A1c 8.3, LDL 136.  Patient on ASA daily.    Stroke Risk Factors - diabetes mellitus, hypertension and OSA  Plan: 1. Statin for lipid management with target LDL<70. 2. PT consult, OT consult, Speech consult 3. Echocardiogram pending 4. Carotid dopplers 5. Prophylactic therapy-Would change ASA to Plavix 75mg  daily 6. NPO until RN stroke swallow screen 7. Telemetry monitoring 8. Frequent neuro checks 9. Evaluation for other options for control of OSA 10. Blood sugar management with target A1c<7.0   Alexis Goodell, MD Neurology [phone] 08/14/2019, 10:54 AM

## 2019-08-15 ENCOUNTER — Observation Stay: Payer: Medicare PPO

## 2019-08-15 DIAGNOSIS — J449 Chronic obstructive pulmonary disease, unspecified: Secondary | ICD-10-CM | POA: Diagnosis not present

## 2019-08-15 DIAGNOSIS — R4701 Aphasia: Secondary | ICD-10-CM | POA: Diagnosis not present

## 2019-08-15 DIAGNOSIS — N179 Acute kidney failure, unspecified: Secondary | ICD-10-CM | POA: Diagnosis not present

## 2019-08-15 DIAGNOSIS — G4733 Obstructive sleep apnea (adult) (pediatric): Secondary | ICD-10-CM | POA: Diagnosis not present

## 2019-08-15 DIAGNOSIS — Z6841 Body Mass Index (BMI) 40.0 and over, adult: Secondary | ICD-10-CM | POA: Diagnosis not present

## 2019-08-15 LAB — BASIC METABOLIC PANEL
Anion gap: 12 (ref 5–15)
BUN: 28 mg/dL — ABNORMAL HIGH (ref 8–23)
CO2: 32 mmol/L (ref 22–32)
Calcium: 8.8 mg/dL — ABNORMAL LOW (ref 8.9–10.3)
Chloride: 95 mmol/L — ABNORMAL LOW (ref 98–111)
Creatinine, Ser: 2.15 mg/dL — ABNORMAL HIGH (ref 0.44–1.00)
GFR calc Af Amer: 27 mL/min — ABNORMAL LOW (ref >60)
GFR calc non Af Amer: 23 mL/min — ABNORMAL LOW (ref >60)
Glucose, Bld: 154 mg/dL — ABNORMAL HIGH (ref 70–99)
Potassium: 4 mmol/L (ref 3.5–5.1)
Sodium: 139 mmol/L (ref 135–145)

## 2019-08-15 LAB — GLUCOSE, CAPILLARY
Glucose-Capillary: 186 mg/dL — ABNORMAL HIGH (ref 70–99)
Glucose-Capillary: 219 mg/dL — ABNORMAL HIGH (ref 70–99)

## 2019-08-15 LAB — ECHOCARDIOGRAM COMPLETE
Height: 66 in
Weight: 5534.43 oz

## 2019-08-15 MED ORDER — POLYETHYLENE GLYCOL 3350 17 G PO PACK
17.0000 g | PACK | ORAL | Status: AC
  Administered 2019-08-15: 09:00:00 17 g via ORAL
  Filled 2019-08-15: qty 1

## 2019-08-15 MED ORDER — EMPAGLIFLOZIN 25 MG PO TABS
25.0000 mg | ORAL_TABLET | Freq: Every day | ORAL | 0 refills | Status: AC
Start: 2019-08-15 — End: ?

## 2019-08-15 MED ORDER — CHLORHEXIDINE GLUCONATE CLOTH 2 % EX PADS
6.0000 | MEDICATED_PAD | Freq: Every day | CUTANEOUS | Status: DC
  Administered 2019-08-15: 6 via TOPICAL

## 2019-08-15 MED ORDER — SENNOSIDES-DOCUSATE SODIUM 8.6-50 MG PO TABS
2.0000 | ORAL_TABLET | ORAL | Status: AC
  Administered 2019-08-15: 2 via ORAL
  Filled 2019-08-15: qty 2

## 2019-08-15 MED ORDER — ATORVASTATIN CALCIUM 40 MG PO TABS: 40.0000 mg | ORAL_TABLET | Freq: Every day | ORAL | 0 refills | Status: DC

## 2019-08-15 MED ORDER — CLOPIDOGREL BISULFATE 75 MG PO TABS
75.0000 mg | ORAL_TABLET | Freq: Every day | ORAL | 0 refills | Status: AC
Start: 2019-08-15 — End: 2020-08-14

## 2019-08-15 MED ORDER — HYDROCODONE-ACETAMINOPHEN 5-325 MG PO TABS: 1.0000 | ORAL_TABLET | Freq: Four times a day (QID) | ORAL | 0 refills | Status: DC | PRN

## 2019-08-15 MED ORDER — ATORVASTATIN CALCIUM 20 MG PO TABS
40.0000 mg | ORAL_TABLET | Freq: Every day | ORAL | Status: DC
  Filled 2019-08-15: qty 2

## 2019-08-15 NOTE — Care Management CC44 (Signed)
Condition Code 44 Documentation Completed  Patient Details  Name: Gabrielle [name] MRN: [id number] Date of Birth: 1950-11-02   Condition Code 44 given:  Yes Patient signature on Condition Code 44 notice:  Yes Documentation of 2 MD's agreement:  Yes Code 44 added to claim:  Yes    Shelbie Hutching, RN 08/15/2019, 9:00 AM

## 2019-08-15 NOTE — Care Management Obs Status (Signed)
[facility] NOTIFICATION   Patient Details  Name: Nusayba Cadenas MRN: [id number] Date of Birth: July 11, 1950   Medicare Observation Status Notification Given:  Yes    Shelbie Hutching, RN 08/15/2019, 9:00 AM

## 2019-08-15 NOTE — Discharge Summary (Addendum)
[place] at [facility] NAME: Gabrielle [name]    MR#:  [id number]  DATE OF BIRTH:  1950/11/06  DATE OF ADMISSION:  08/13/2019   ADMITTING PHYSICIAN: Ezekiel Slocumb, DO  DATE OF DISCHARGE: 08/15/2019  PRIMARY CARE PHYSICIAN: [name], MD   ADMISSION DIAGNOSIS:  Aphasia [R47.01] AKI (acute kidney injury) ([place]) [N17.9] Stroke-like episode [R29.90] UTI (urinary tract infection) [N39.0] DISCHARGE DIAGNOSIS:  Principal Problem:   Aphasia Active Problems:   Diabetes ([place])   Acute on chronic kidney failure (HCC)   COPD with chronic bronchitis (HCC)   OSA (obstructive sleep apnea)   BMI 50.0-59.9, adult (HCC)   UTI (urinary tract infection)  SECONDARY DIAGNOSIS:   Past Medical History:  Diagnosis Date  . Allergy    seasonal allergies  . Arthritis   . Chronic kidney disease    she reports grade 2 kidney disease  . Chronic venous stasis dermatitis of both lower extremities   . Diabetes mellitus without complication ([place])   . Hypertension   . Morbid obesity ([place])   . Osteoarthritis   . Oxygen dependent   . Skin cancer   . Sleep apnea    HOSPITAL COURSE:  69 year old female with a known history of COPD with chronic hypoxemia on 3 2 L oxygen, CKD stage III, IDDM, hypertension, obstructive sleep apnea intolerant of BiPAP admitted for recurrent episode of speech difficulty and right-sided numbness/weakness  Right-sided weakness/numbness and speech difficulty  Could be due to TIA from poorly controlled OSA, could be just solely related to OSA per neurology MRI of brain negative for any acute changes  Echo shows no cardiac source of emboli with EF of 30 to 35% Carotid Dopplers shows no hemodynamically significant stenosis Neurology recommend changing aspirin to Plavix 75 mg oral daily  Asymptomatic bacteriuria Patient does not have fever, leukocytosis.  Wears adult diapers.  She was treated with 2 days of IV Rocephin.  I will stop this at  discharge.  She does not have any more hematuria which was listed on admission likely due to instrumentation by Foley. Foley was inserted on admission and has been removed at discharge.  She likely has candida in the urine, she uses Diflucan as needed at her facility  Acute on chronic kidney disease stage III -Outpatient follow-up with nephrology, renal function back to her baseline with hydration  Anemia of chronic kidney disease Patient denies any GI bleed.  She may benefit from outpatient erythropoietin at nephrology follow-up  Chronic systolic CHF EF of 30 to 88% on echo, outpatient follow-up with cardiology  Insulin-dependent diabetes mellitus Continue home regimen.  Patient would like to resume her Jardiance at discharge  Obstructive sleep apnea -intolerant to CPAP/BiPAP.  She reports waking up with some confusion and difficulty speaking and some focal weakness with this.  She may benefit from trilogy.  Strongly advised outpatient follow-up with his sleep specialist   DISCHARGE CONDITIONS:  Stable CONSULTS OBTAINED:  Treatment Team:  Catarina Hartshorn, MD DRUG ALLERGIES:   Allergies  Allergen Reactions  . Lasix [Furosemide]     Numbness and tingling of the face   . Ultram [Tramadol] Other (See Comments)    Pt reports, "makes my head feel funny."  . Erythromycin Rash  . Plasticized Base [Plastibase] Rash  . Tetracyclines & Related Rash   DISCHARGE MEDICATIONS:   Allergies as of 08/15/2019      Reactions   Lasix [furosemide]    Numbness  and tingling of the face    Ultram [tramadol] Other (See Comments)   Pt reports, "makes my head feel funny."   Erythromycin Rash   Plasticized Base [plastibase] Rash   Tetracyclines & Related Rash      Medication List    STOP taking these medications   aspirin 325 MG tablet     TAKE these medications   acetaminophen 500 MG tablet Commonly known as: TYLENOL Take 500 mg by mouth 3 (three) times daily.   atorvastatin 40 MG  tablet Commonly known as: LIPITOR Take 1 tablet (40 mg total) by mouth daily.   benztropine 1 MG tablet Commonly known as: COGENTIN Take 1 mg by mouth 2 (two) times daily.   bisacodyl 5 MG EC tablet Commonly known as: DULCOLAX Take 10 mg by mouth daily as needed for moderate constipation or severe constipation.   busPIRone 10 MG tablet Commonly known as: BUSPAR Take 10 mg by mouth 3 (three) times daily.   Bydureon BCise 2 MG/0.85ML Auij Generic drug: Exenatide ER Inject 2 mg into the skin every Wednesday.   ciclopirox 0.77 % Susp Commonly known as: LOPROX Apply 1 application topically daily.   clobetasol cream 0.05 % Commonly known as: TEMOVATE Apply 1 application topically in the morning and at bedtime. (apply to scalp, legs and arms)   clopidogrel 75 MG tablet Commonly known as: Plavix Take 1 tablet (75 mg total) by mouth daily.   docusate sodium 100 MG capsule Commonly known as: COLACE Take 100 mg by mouth daily.   docusate sodium 100 MG capsule Commonly known as: COLACE Take 100 mg by mouth every 8 (eight) hours as needed for mild constipation or moderate constipation.   empagliflozin 25 MG Tabs tablet Commonly known as: Jardiance Take 1 tablet (25 mg total) by mouth daily before breakfast.   ethacrynic acid 25 MG tablet Commonly known as: EDECRIN Take 3 tablets (75 mg total) by mouth 2 (two) times daily.   fluticasone 50 MCG/ACT nasal spray Commonly known as: FLONASE Place 1 spray into both nostrils every 12 (twelve) hours as needed for allergies.   HumaLOG 100 UNIT/ML cartridge Generic drug: insulin lispro Inject 5 Units into the skin See admin instructions. Inject 5u under the skin three times daily with meals with sliding scale   hydrALAZINE 25 MG tablet Commonly known as: APRESOLINE Take 25 mg by mouth 4 (four) times daily.   HYDROcodone-acetaminophen 5-325 MG tablet Commonly known as: NORCO/VICODIN Take 1 tablet by mouth every 6 (six) hours as  needed for up to 4 doses for moderate pain or severe pain.   insulin glargine 100 UNIT/ML injection Commonly known as: LANTUS Inject 0.85 mLs (85 Units total) into the skin daily. What changed:  how much to take when to take this   ipratropium-albuterol 0.5-2.5 (3) MG/3ML Soln Commonly known as: DUONEB Take 3 mLs by nebulization every 4 (four) hours as needed (congestion).   melatonin 1 MG Tabs tablet Take 3 mg by mouth at bedtime.   nabumetone 500 MG tablet Commonly known as: RELAFEN Take 500 mg by mouth daily.   nitroGLYCERIN 0.4 MG SL tablet Commonly known as: NITROSTAT Place 0.4 mg under the tongue every 5 (five) minutes as needed for chest pain.   nystatin cream Commonly known as: MYCOSTATIN Apply 1 application topically 2 (two) times daily. (apply to perennial area)   nystatin powder Commonly known as: MYCOSTATIN/NYSTOP Apply 1 application topically 2 (two) times daily. (apply to perennial area)   omeprazole 20  MG capsule Commonly known as: PRILOSEC Take 20 mg by mouth every other day.   perphenazine 2 MG tablet Commonly known as: TRILAFON Take 1 mg by mouth 2 (two) times daily.   polyethylene glycol 17 g packet Commonly known as: MIRALAX / GLYCOLAX Take 17 g by mouth daily.   sodium chloride 0.65 % Soln nasal spray Commonly known as: OCEAN Place 2 sprays into both nostrils every 8 (eight) hours as needed for congestion.   vitamin B-12 1000 MCG tablet Commonly known as: CYANOCOBALAMIN Take 1,000 mcg by mouth every Monday, Wednesday, and Friday.   Vitamin D3 25 MCG (1000 UT) Caps Take 1,000 Units by mouth daily.            Discharge Care Instructions  (From admission, onward)         Start     Ordered   08/15/19 0000  Change dressing (specify)       Comments: Dressing change: every other day   08/15/19 1034         DISCHARGE INSTRUCTIONS:   DIET:  Renal diet DISCHARGE CONDITION:  Stable ACTIVITY:  Activity as tolerated OXYGEN:   Home Oxygen: Yes.    Oxygen Delivery: 2 liters/min via Patient connected to nasal cannula oxygen DISCHARGE LOCATION:  nursing home -palliative care to follow  If you experience worsening of your admission symptoms, develop shortness of breath, life threatening emergency, suicidal or homicidal thoughts you must seek medical attention immediately by calling 911 or calling your MD immediately  if symptoms less severe.  You Must read complete instructions/literature along with all the possible adverse reactions/side effects for all the Medicines you take and that have been prescribed to you. Take any new Medicines after you have completely understood and accpet all the possible adverse reactions/side effects.   Please note  You were cared for by a hospitalist during your hospital stay. If you have any questions about your discharge medications or the care you received while you were in the hospital after you are discharged, you can call the unit and asked to speak with the hospitalist on call if the hospitalist that took care of you is not available. Once you are discharged, your primary care physician will handle any further medical issues. Please note that NO REFILLS for any discharge medications will be authorized once you are discharged, as it is imperative that you return to your primary care physician (or establish a relationship with a primary care physician if you do not have one) for your aftercare needs so that they can reassess your need for medications and monitor your lab values.    On the day of Discharge:  VITAL SIGNS:  Blood pressure 138/61, pulse 89, temperature 98.3 F (36.8 C), resp. rate 16, height 5\' 6"  (1.676 m), weight (!) 156.9 kg, SpO2 98 %. PHYSICAL EXAMINATION:  GENERAL:  69 y.o.-year-old patient lying in the bed with no acute distress.  EYES: Pupils equal, round, reactive to light and accommodation. No scleral icterus. Extraocular muscles intact.  HEENT: Head  atraumatic, normocephalic. Oropharynx and nasopharynx clear.  NECK:  Supple, no jugular venous distention. No thyroid enlargement, no tenderness.  LUNGS: Normal breath sounds bilaterally, no wheezing, rales,rhonchi or crepitation. No use of accessory muscles of respiration.  CARDIOVASCULAR: S1, S2 normal. No murmurs, rubs, or gallops.  ABDOMEN: Soft, non-tender, non-distended. Bowel sounds present. No organomegaly or mass.  EXTREMITIES: No pedal edema, cyanosis, or clubbing.  NEUROLOGIC: Cranial nerves II through XII are intact. Muscle strength 5/5  in all extremities. Sensation intact. Gait not checked.  PSYCHIATRIC: The patient is alert and oriented x 3.  SKIN: No obvious rash, lesion, or ulcer.  DATA REVIEW:   CBC Recent Labs  Lab 08/14/19 1806  WBC 6.3  HGB 8.3*  HCT 26.7*  PLT 296    Chemistries  Recent Labs  Lab 08/13/19 2006 08/13/19 2006 08/15/19 0455  NA 137   < > 139  K 3.9   < > 4.0  CL 93*   < > 95*  CO2 31   < > 32  GLUCOSE 174*   < > 154*  BUN 32*   < > 28*  CREATININE 2.27*   < > 2.15*  CALCIUM 8.6*   < > 8.8*  AST 10*  --   --   ALT 8  --   --   ALKPHOS 54  --   --   BILITOT 1.1  --   --    < > = values in this interval not displayed.     Outpatient follow-up  Follow-up Information    [name], MD. Schedule an appointment as soon as possible for a visit in 1 week(s).   Specialty: Internal Medicine Contact information: [address] [place] [address] [phone]        Murlean Iba, MD. Schedule an appointment as soon as possible for a visit in 2 week(s).   Specialty: Nephrology Contact information: [place] [address] [phone]        Minna Merritts, MD. Schedule an appointment as soon as possible for a visit in 2 week(s).   Specialty: Cardiology Contact information: [place] [address] [phone]                Management plans  discussed with the patient, family (updated son Gabrielle [name] over phone) and they are in agreement.  CODE STATUS: Full Code   TOTAL TIME TAKING CARE OF THIS PATIENT: 45 minutes.    Max Sane M.D on 08/15/2019 at 11:59 AM  [facility]   CC: Primary care physician; [name], MD   Note: This dictation was prepared with Dragon dictation along with smaller phrase technology. Any transcriptional errors that result from this process are unintentional.

## 2019-08-15 NOTE — TOC Initial Note (Signed)
Transition of Care [facility]) - Initial/Assessment Note    Patient Details  Name: Gabrielle [name] MRN: [id number] Date of Birth: 09/15/1950  [facility]) CM/SW Contact:    Shelbie Hutching, RN Phone Number: 08/15/2019, 9:02 AM  Clinical Narrative:                 Patient admitted to the hospital for aphasia and neurological symptoms.  Medical work up was negative.  Patient was changed to observation status last night.  Patient will be discharged back to [facility] today.  Patient has lived at [facility] for the past 2 years, she reports she has a wound on her leg that she has been trying to get healed for the past 2 years.  Patient does not get out of bed at the facility, she will be EMS transport at discharge.  Patient will let her family know that she is discharging.    Expected Discharge Plan: [place] Barriers to Discharge: Barriers Resolved   Patient Goals and CMS Choice Patient states their goals for this hospitalization and ongoing recovery are:: to get back to [facility] and work on getting her leg healed CMS Medicare.gov Compare Post Acute Care list provided to:: Patient Choice offered to / list presented to : Patient  Expected Discharge Plan and Services Expected Discharge Plan: [place]   Discharge Planning Services: CM Consult Post Acute Care Choice: Resumption of Svcs/PTA Provider Living arrangements for the past 2 months: [place]                                      Prior Living Arrangements/Services Living arrangements for the past 2 months: [place] Lives with:: Facility Resident Patient language and need for interpreter reviewed:: Yes Do you feel safe going back to the place where you live?: Yes      Need for Family Participation in Patient Care: Yes (Comment) Care giver support system in place?: Yes (comment)   Criminal Activity/Legal Involvement  Pertinent to Current Situation/Hospitalization: No - Comment as needed  Activities of Daily Living Home Assistive Devices/Equipment: Oxygen ADL Screening (condition at time of admission) Patient's cognitive ability adequate to safely complete daily activities?: Yes Is the patient deaf or have difficulty hearing?: No Does the patient have difficulty seeing, even when wearing glasses/contacts?: No Does the patient have difficulty concentrating, remembering, or making decisions?: No Patient able to express need for assistance with ADLs?: Yes Does the patient have difficulty dressing or bathing?: Yes Independently performs ADLs?: No Communication: Independent Dressing (OT): Needs assistance Is this a change from baseline?: Pre-admission baseline Grooming: Needs assistance Is this a change from baseline?: Pre-admission baseline Feeding: Independent (set up needed.) Bathing: Needs assistance Toileting: Needs assistance Is this a change from baseline?: Pre-admission baseline In/Out Bed: Dependent (bed-bound baseline x 2 years.) Is this a change from baseline?: Pre-admission baseline Walks in Home: Dependent (Bed bound baseline x  2 years. W/C bound prior to bed bound.) Is this a change from baseline?: Pre-admission baseline Does the patient have difficulty walking or climbing stairs?: Yes Weakness of Legs: Both Weakness of Arms/Hands: Both  Permission Sought/Granted Permission sought to share information with : Case Manager, Chartered certified accountant granted to share information with : Yes, Verbal Permission Granted     Permission granted to share info w AGENCY: [employer]  Emotional Assessment Appearance:: Appears stated age Attitude/Demeanor/Rapport: Engaged Affect (typically observed): Accepting Orientation: : Oriented to Self, Oriented to Place, Oriented to  Time, Oriented to Situation Alcohol / Substance Use: Not Applicable Psych  Involvement: No (comment)  Admission diagnosis:  Aphasia [R47.01] AKI (acute kidney injury) ([place]) [N17.9] Stroke-like episode [R29.90] UTI (urinary tract infection) [N39.0] Patient Active Problem List   Diagnosis Date Noted  . COPD with chronic bronchitis ([place]) 08/14/2019  . OSA (obstructive sleep apnea) 08/14/2019  . BMI 50.0-59.9, adult ([place]) 08/14/2019  . UTI (urinary tract infection) 08/14/2019  . Aphasia 08/13/2019  . 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 Cellulitis 04/29/2016  . Acute on chronic kidney failure ([place]) 10/02/2015  . Diabetes ([place]) 10/01/2015  . SOB (shortness of breath) 10/01/2015  . AKI (acute kidney injury) ([place]) 10/01/2015  . Hyperkalemia 07/21/2015  . Uncontrolled hypertension 07/21/2015   PCP:  [name], MD Pharmacy:   [facility], [place] - [address], [address], [place] [address] Phone: [phone] Fax: [phone]  [facility], [place] [address] [place] [address] Phone: [phone] Fax: [place], [employer]. [place]. [place] [address] Phone: [phone] Fax: [phone]     Social Determinants of Health (SDOH) Interventions    Readmission Risk Interventions No flowsheet data found.

## 2019-08-15 NOTE — NC FL2 (Signed)
[place] LEVEL OF CARE SCREENING TOOL     IDENTIFICATION  Patient Name: Gabrielle [name] Birthdate: January 11, 1951 Sex: female Admission Date (Current Location): 08/13/2019  [place] and [place] Number:  Engineering geologist and Address:  [facility], [address], [place], [place] [address]      Provider Number: [id number]  Attending Physician Name and Address:  Max Sane, MD  Relative Name and Phone Number:       Current Level of Care: Hospital Recommended Level of Care: [place] Prior Approval Number:    Date Approved/Denied:   PASRR Number: [id number]  Discharge Plan: SNF    Current Diagnoses: Patient Active Problem List   Diagnosis Date Noted  . COPD with chronic bronchitis ([place]) 08/14/2019  . OSA (obstructive sleep apnea) 08/14/2019  . BMI 50.0-59.9, adult ([place]) 08/14/2019  . UTI (urinary tract infection) 08/14/2019  . Aphasia 08/13/2019  . 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 Cellulitis 04/29/2016  . Acute on chronic kidney failure ([place]) 10/02/2015  . Diabetes ([place]) 10/01/2015  . SOB (shortness of breath) 10/01/2015  . AKI (acute kidney injury) ([place]) 10/01/2015  . Hyperkalemia 07/21/2015  . Uncontrolled hypertension 07/21/2015    Orientation RESPIRATION BLADDER Height & Weight     Self, Time, Situation, Place  O2 (chronic 2L [place]) Continent Weight: (!) 156.9 kg Height:  5\' 6"  (167.6 cm)  BEHAVIORAL SYMPTOMS/MOOD NEUROLOGICAL BOWEL NUTRITION STATUS      Continent Diet (see discharge summary)  AMBULATORY STATUS COMMUNICATION OF NEEDS Skin   Total Care Verbally Other (Comment) (chronic left leg wound)                       Personal Care Assistance Level of Assistance  Total care Bathing Assistance: Maximum assistance Feeding assistance: Limited assistance Dressing Assistance: Maximum assistance Total Care Assistance: Limited assistance   Functional Limitations Info              SPECIAL CARE FACTORS FREQUENCY                       Contractures Contractures Info: Not present    Additional Factors Info  Code Status, Allergies Code Status Info: Full Allergies Info: lasix, ultram,, erythromycin, plasticized base, tetracyclines           Current Medications (08/15/2019):  This is the current hospital active medication list Current Facility-Administered Medications  Medication Dose Route Frequency Provider Last Rate Last Admin  . acetaminophen (TYLENOL) tablet 650 mg  650 mg Oral Q4H PRN Tu, Ching T, DO       Or  . acetaminophen (TYLENOL) 160 MG/5ML solution 650 mg  650 mg Per Tube Q4H PRN Tu, Ching T, DO       Or  . acetaminophen (TYLENOL) suppository 650 mg  650 mg Rectal Q4H PRN Tu, Ching T, DO      . aspirin EC tablet 81 mg  81 mg Oral Daily Tu, Ching T, DO   81 mg at 08/15/19 0907  . benztropine (COGENTIN) tablet 1 mg  1 mg Oral BID Nicole Kindred A, DO   1 mg at 08/15/19 0910  . bisacodyl (DULCOLAX) EC tablet 10 mg  10 mg Oral Daily PRN Nicole Kindred A, DO      . busPIRone (BUSPAR) tablet 10 mg  10 mg Oral TID Nicole Kindred A, DO   10 mg at 08/15/19 0910  . cefTRIAXone (ROCEPHIN) 2 g in  sodium chloride 0.9 % 100 mL IVPB  2 g Intravenous Q24H Sharion Settler, NP   Stopped at 08/15/19 0357  . Chlorhexidine Gluconate Cloth 2 % PADS 6 each  6 each Topical Daily Lang Snow, NP   6 each at 08/15/19 [phone]  . cholecalciferol (VITAMIN D3) tablet 1,000 Units  1,000 Units Oral Daily Nicole Kindred A, DO   1,000 Units at 08/15/19 0712  . docusate sodium (COLACE) capsule 100 mg  100 mg Oral Daily Nicole Kindred A, DO   100 mg at 08/15/19 1975  . docusate sodium (COLACE) capsule 100 mg  100 mg Oral Q8H PRN Nicole Kindred A, DO      . enoxaparin (LOVENOX) injection 40 mg  40 mg Subcutaneous Q12H Hallaji, Sheema M, RPH      . ethacrynic acid (EDECRIN) tablet 75 mg  75 mg Oral BID Nicole Kindred A, DO   75 mg at 08/15/19 0909  . fluticasone  (FLONASE) 50 MCG/ACT nasal spray 1 spray  1 spray Each Nare Q12H PRN Nicole Kindred A, DO      . hydrALAZINE (APRESOLINE) tablet 25 mg  25 mg Oral Q6H Griffith, Kelly A, DO   25 mg at 08/15/19 0532  . HYDROcodone-acetaminophen (NORCO/VICODIN) 5-325 MG per tablet 1 tablet  1 tablet Oral Q6H PRN Nicole Kindred A, DO      . insulin aspart (novoLOG) injection 0-20 Units  0-20 Units Subcutaneous TID WC Tu, Ching T, DO   4 Units at 08/15/19 0908  . insulin aspart (novoLOG) injection 0-5 Units  0-5 Units Subcutaneous QHS Tu, Ching T, DO      . ipratropium-albuterol (DUONEB) 0.5-2.5 (3) MG/3ML nebulizer solution 3 mL  3 mL Nebulization Q4H PRN Nicole Kindred A, DO      . melatonin tablet 5 mg  5 mg Oral QHS Nicole Kindred A, DO   5 mg at 08/14/19 2138  . nitroGLYCERIN (NITROSTAT) SL tablet 0.4 mg  0.4 mg Sublingual Q5 min PRN Nicole Kindred A, DO      . pantoprazole (PROTONIX) EC tablet 40 mg  40 mg Oral Daily Nicole Kindred A, DO   40 mg at 08/15/19 0907  . perphenazine (TRILAFON) tablet 1 mg  1 mg Oral BID Nicole Kindred A, DO   1 mg at 08/15/19 0051  . sodium chloride (OCEAN) 0.65 % nasal spray 2 spray  2 spray Each Nare Q8H PRN Nicole Kindred A, DO      . vitamin B-12 (CYANOCOBALAMIN) tablet 1,000 mcg  1,000 mcg Oral Daily Tu, Ching T, DO      . vitamin B-12 (CYANOCOBALAMIN) tablet 1,000 mcg  1,000 mcg Oral Q M,W,F Ezekiel Slocumb, DO         Discharge Medications: Please see discharge summary for a list of discharge medications.  Relevant Imaging Results:  Relevant Lab Results:   Additional Information SSN: [id number]  Shelbie Hutching, RN

## 2019-08-15 NOTE — TOC Transition Note (Signed)
Transition of Care [facility]) - CM/SW Discharge Note   Patient Details  Name: Gabrielle [name] MRN: [id number] Date of Birth: 14-Feb-1950  Transition of Care [facility]) CM/SW Contact:  Shelbie Hutching, RN Phone Number: 08/15/2019, 10:52 AM   Clinical Narrative:    Patient medically ready to discharge back to [facility].  Patient will transport via [employer] cabin crew.     Final next level of care: [place] Barriers to Discharge: Barriers Resolved   Patient Goals and CMS Choice Patient states their goals for this hospitalization and ongoing recovery are:: to get back to [facility] and work on getting her leg healed CMS Medicare.gov Compare Post Acute Care list provided to:: Patient Choice offered to / list presented to : Patient  Discharge Placement   Existing PASRR number confirmed : 08/15/19          Patient [facility] bed at: [facility] Patient to be transferred to facility by: [facility] Name of family member notified: Patient will notify family Patient and family notified of of transfer: 08/15/19  Discharge Plan and Services   Discharge Planning Services: CM Consult Post Acute Care Choice: Resumption of Svcs/PTA Provider                               Social Determinants of Health (SDOH) Interventions     Readmission Risk Interventions No flowsheet data found.

## 2019-08-15 NOTE — Progress Notes (Signed)
Subjective: No new neurological complaints  Objective: Current vital signs: BP 138/61 (BP Location: Left Arm)   Pulse 89   Temp 98.3 F (36.8 C)   Resp 16   Ht 5\' 6"  (1.676 m)   Wt (!) 156.9 kg   SpO2 98%   BMI 55.83 kg/m  Vital signs in last 24 hours: Temp:  [97.9 F (36.6 C)-98.3 F (36.8 C)] 98.3 F (36.8 C) (07/08 0753) Pulse Rate:  [85-99] 89 (07/08 0753) Resp:  [14-22] 16 (07/08 0753) BP: (113-142)/(61-80) 138/61 (07/08 0753) SpO2:  [98 %-100 %] 98 % (07/08 0753)  Intake/Output from previous day: 07/07 0701 - 07/08 0700 In: 200 [IV Piggyback:200] Out: 3075 [Urine:3075] Intake/Output this shift: No intake/output data recorded. Nutritional status:  Diet Order            Diet - low sodium heart healthy           Diet heart healthy/carb modified Room service appropriate? Yes; Fluid consistency: Thin  Diet effective now                 Neurologic Exam: Mental Status: Alert, oriented, thought content appropriate.  Speech fluent without evidence of aphasia.  Able to follow 3 step commands without difficulty. Cranial Nerves: II: Visual fields grossly normal, pupils equal, round, reactive to light and accommodation III,IV, VI: ptosis not present, extra-ocular motions intact bilaterally V,VII: smile symmetric, facial light touch sensation normal bilaterally VIII: hearing normal bilaterally IX,X: gag reflex present XI: bilateral shoulder shrug XII: midline tongue extension Motor: 5/5 throuhgout Sensory: Pinprick and light touch intact throughout, bilaterally   Lab Results: Basic Metabolic Panel: Recent Labs  Lab 08/13/19 2006 08/15/19 0455  NA 137 139  K 3.9 4.0  CL 93* 95*  CO2 31 32  GLUCOSE 174* 154*  BUN 32* 28*  CREATININE 2.27* 2.15*  CALCIUM 8.6* 8.8*    Liver Function Tests: Recent Labs  Lab 08/13/19 2006  AST 10*  ALT 8  ALKPHOS 54  BILITOT 1.1  PROT 7.4  ALBUMIN 2.5*   No results for input(s): LIPASE, AMYLASE in the last 168  hours. No results for input(s): AMMONIA in the last 168 hours.  CBC: Recent Labs  Lab 08/13/19 2006 08/14/19 0452 08/14/19 0950 08/14/19 1621 08/14/19 1806  WBC 13.2* 8.3 6.7 6.8 6.3  NEUTROABS 10.6* 5.8 4.3 4.0  --   HGB 8.4* 8.6* 8.1* 8.3* 8.3*  HCT 27.3* 27.1* 26.7* 26.6* 26.7*  MCV 93.5 91.6 94.0 92.7 92.1  PLT 327 322 305 315 296    Cardiac Enzymes: No results for input(s): CKTOTAL, CKMB, CKMBINDEX, TROPONINI in the last 168 hours.  Lipid Panel: Recent Labs  Lab 08/14/19 0452  CHOL 187  TRIG 82  HDL 35*  CHOLHDL 5.3  VLDL 16  LDLCALC 136*    CBG: Recent Labs  Lab 08/14/19 0758 08/14/19 1147 08/14/19 1539 08/14/19 2034 08/15/19 0832  GLUCAP 140* 166* 161* 147* 186*    Microbiology: Results for orders placed or performed during the hospital encounter of 08/13/19  SARS Coronavirus 2 by RT PCR (hospital order, performed in [facility] hospital lab) Nasopharyngeal Nasopharyngeal Swab     Status: None   Collection Time: 08/13/19  9:44 PM   Specimen: Nasopharyngeal Swab  Result Value Ref Range Status   SARS Coronavirus 2 NEGATIVE NEGATIVE Final    Comment: (NOTE) SARS-CoV-2 target nucleic acids are NOT DETECTED.  The SARS-CoV-2 RNA is generally detectable in upper and lower respiratory specimens during the acute  phase of infection. The lowest concentration of SARS-CoV-2 viral copies this assay can detect is 250 copies / mL. A negative result does not preclude SARS-CoV-2 infection and should not be used as the sole basis for treatment or other patient management decisions.  A negative result may occur with improper specimen collection / handling, submission of specimen other than nasopharyngeal swab, presence of viral mutation(s) within the areas targeted by this assay, and inadequate number of viral copies (<250 copies / mL). A negative result must be combined with clinical observations, patient history, and epidemiological information.  Fact Sheet for  Patients:   StrictlyIdeas.no  Fact Sheet for Healthcare Providers: BankingDealers.co.za  This test is not yet approved or  cleared by the [place] FDA and has been authorized for detection and/or diagnosis of SARS-CoV-2 by FDA under an Emergency Use Authorization (EUA).  This EUA will remain in effect (meaning this test can be used) for the duration of the COVID-19 declaration under Section 564(b)(1) of the Act, 21 U.S.C. section 360bbb-3(b)(1), unless the authorization is terminated or revoked sooner.  Performed at [facility], [address] [place], [place] [address]     Coagulation Studies: Recent Labs    08/13/19 05-04-04  LABPROT 13.6  INR 1.1    Imaging: CT ABDOMEN PELVIS WO CONTRAST  Result Date: 08/14/2019 CLINICAL DATA:  Hematuria with unknown cause EXAM: CT ABDOMEN AND PELVIS WITHOUT CONTRAST TECHNIQUE: Multidetector CT imaging of the abdomen and pelvis was performed following the standard protocol without IV contrast. COMPARISON:  None. FINDINGS: Lower chest: Low-density blood pool suggesting anemia. Small left pleural effusion with left lower lobe atelectasis and patchy ground-glass opacity at the right base. Hepatobiliary: Geographic low-density at the central liver attributed to steatosis.Cholelithiasis. No inflammatory changes or bile duct dilatation. Hepatoduodenal calcifications are nodal. Pancreas: Unremarkable. Spleen: Unremarkable. Adrenals/Urinary Tract: Negative adrenals. Symmetric perinephric stranding which is nonspecific. Negative for urolithiasis or hydronephrosis. Thick walled bladder with indistinct adjacent fat, further assessment limited by collapse around a Foley catheter. Stomach/Bowel:  No obstruction. No visible bowel inflammation. Vascular/Lymphatic: Diffuse atherosclerotic plaque. No mass or adenopathy. Reproductive:No pathologic findings. Other: No ascites or pneumoperitoneum.  Musculoskeletal: Ovoid simple lipoma medial to the proximal left femur. Diffuse muscular atrophy and spinal degeneration. IMPRESSION: 1. Thick walled bladder with adjacent fat stranding suggesting cystitis. Bladder assessment is limited due to collapse around the Foley catheter. 2. No hydronephrosis or urolithiasis. 3. Cholelithiasis. 4. Small left pleural effusion with atelectasis. Airspace disease at the right base which appears inflammatory, please correlate for pneumonia. Electronically Signed   By: Monte Fantasia M.D.   On: 08/14/2019 05:23   MR BRAIN WO CONTRAST  Result Date: 08/14/2019 CLINICAL DATA:  Speech difficulty EXAM: MRI HEAD WITHOUT CONTRAST TECHNIQUE: Multiplanar, multiecho pulse sequences of the brain and surrounding structures were obtained without intravenous contrast. COMPARISON:  None. FINDINGS: Brain: No acute infarct, acute hemorrhage or extra-axial collection. Multifocal white matter hyperintensity, most commonly due to chronic ischemic microangiopathy. Normal volume of CSF spaces. No chronic microhemorrhage. Normal midline structures. Vascular: Normal flow voids. Skull and upper cervical spine: Normal marrow signal. Sinuses/Orbits: Negative. Other: None. IMPRESSION: 1. No acute intracranial abnormality. 2. Multifocal white matter hyperintensity, most commonly due to chronic ischemic microangiopathy. Electronically Signed   By: Ulyses Jarred M.D.   On: 08/14/2019 03:01   DG Chest Portable 1 View  Result Date: 08/13/2019 CLINICAL DATA:  Altered mental status EXAM: PORTABLE CHEST 1 VIEW COMPARISON:  05/02/2017 FINDINGS: Mild cardiomegaly. No focal opacity or  pleural effusion. No pneumothorax. IMPRESSION: No active disease. Mild cardiomegaly. Electronically Signed   By: Donavan Foil M.D.   On: 08/13/2019 20:37   ECHOCARDIOGRAM COMPLETE  Result Date: 08/15/2019    ECHOCARDIOGRAM REPORT   Patient Name:   Gabrielle [name] Date of Exam: 08/14/2019 Medical Rec #:  [id number]     Height:        66.0 in Accession #:    [id number]    Weight:       345.9 lb Date of Birth:  1950/04/21    BSA:          2.524 m Patient Age:    72 years      BP:           140/71 mmHg Patient Gender: F             HR:           88 bpm. Exam Location:  [facility] Procedure: 2D Echo, Cardiac Doppler, Color Doppler and Intracardiac            Opacification Agent Indications:     I163.9 Stroke  History:         Patient has prior history of Echocardiogram examinations, most                  recent 07/25/2015. Risk Factors:Hypertension, Diabetes and                  Morbid Obesity. Sleep apnea. Chronic kidney disease.  Sonographer:     Wilford Sports Rodgers-Jones Referring Phys:  [id number] [place] T TU Diagnosing Phys: Ida Rogue MD  Sonographer Comments: Suboptimal apical window and patient is morbidly obese. IMPRESSIONS  1. Left ventricular ejection fraction, by estimation, is 30 to 35%. The left ventricle has moderately decreased function. The left ventricle demonstrates global hypokinesis. The left ventricular internal cavity size was moderately dilated. Left ventricular diastolic parameters are consistent with Grade I diastolic dysfunction (impaired relaxation).  2. Right ventricular systolic function is mildly reduced. The right ventricular size is mildly enlarged. There is mildly elevated pulmonary artery systolic pressure. The estimated right ventricular systolic pressure is 18.8 mmHg.  3. Left atrial size was moderately dilated.  4. The inferior vena cava is dilated in size with <50% respiratory variability, suggesting right atrial pressure of 15 mmHg. FINDINGS  Left Ventricle: Left ventricular ejection fraction, by estimation, is 30 to 35%. The left ventricle has moderately decreased function. The left ventricle demonstrates global hypokinesis. Definity contrast agent was given IV to delineate the left ventricular endocardial borders. The left ventricular internal cavity size was moderately dilated. There is no left ventricular  hypertrophy. Left ventricular diastolic parameters are consistent with Grade I diastolic dysfunction (impaired relaxation). Right Ventricle: The right ventricular size is mildly enlarged. No increase in right ventricular wall thickness. Right ventricular systolic function is mildly reduced. There is mildly elevated pulmonary artery systolic pressure. The tricuspid regurgitant  velocity is 2.55 m/s, and with an assumed right atrial pressure of 15 mmHg, the estimated right ventricular systolic pressure is 41.6 mmHg. Left Atrium: Left atrial size was moderately dilated. Right Atrium: Right atrial size was normal in size. Pericardium: There is no evidence of pericardial effusion. Mitral Valve: The mitral valve is normal in structure. Normal mobility of the mitral valve leaflets. Mild mitral valve regurgitation. No evidence of mitral valve stenosis. Tricuspid Valve: The tricuspid valve is normal in structure. Tricuspid valve regurgitation is mild . No evidence of tricuspid stenosis. Aortic Valve: The aortic  valve is normal in structure. Aortic valve regurgitation is not visualized. Mild to moderate aortic valve sclerosis/calcification is present, without any evidence of aortic stenosis. Pulmonic Valve: The pulmonic valve was normal in structure. Pulmonic valve regurgitation is not visualized. No evidence of pulmonic stenosis. Aorta: The aortic root is normal in size and structure. Venous: The inferior vena cava is dilated in size with less than 50% respiratory variability, suggesting right atrial pressure of 15 mmHg. IAS/Shunts: No atrial level shunt detected by color flow Doppler.  LEFT VENTRICLE PLAX 2D LVIDd:         6.38 cm  Diastology LVIDs:         4.69 cm  LV e' lateral:   6.20 cm/s LV PW:         0.93 cm  LV E/e' lateral: 14.3 LV IVS:        0.73 cm  LV e' medial:    5.87 cm/s LVOT diam:     2.10 cm  LV E/e' medial:  15.1 LV SV:         51 LV SV Index:   20 LVOT Area:     3.46 cm  RIGHT VENTRICLE             IVC  RV Basal diam:  5.14 cm     IVC diam: 2.28 cm RV S prime:     17.60 cm/s TAPSE (M-mode): 3.2 cm LEFT ATRIUM             Index       RIGHT ATRIUM           Index LA diam:        5.50 cm 2.18 cm/m  RA Area:     23.60 cm LA Vol (A2C):   62.1 ml 24.60 ml/m RA Volume:   89.10 ml  35.30 ml/m LA Vol (A4C):   70.2 ml 27.81 ml/m LA Biplane Vol: 68.6 ml 27.18 ml/m  AORTIC VALVE LVOT Vmax:   88.80 cm/s LVOT Vmean:  57.700 cm/s LVOT VTI:    0.147 m  AORTA Ao Root diam: 3.30 cm MITRAL VALVE                TRICUSPID VALVE MV Area (PHT): 4.06 cm     TR Peak grad:   26.0 mmHg MV Decel Time: 187 msec     TR Vmax:        255.00 cm/s MV E velocity: 88.40 cm/s MV A velocity: 106.00 cm/s  SHUNTS MV E/A ratio:  0.83         Systemic VTI:  0.15 m                             Systemic Diam: 2.10 cm Ida Rogue MD Electronically signed by Ida Rogue MD Signature Date/Time: 08/15/2019/7:41:12 AM    Final    CT HEAD CODE STROKE WO CONTRAST  Result Date: 08/13/2019 CLINICAL DATA:  Code stroke. Slurred speech. Visual disturbance. Right arm numbness and weakness beginning 1730 hours. EXAM: CT HEAD WITHOUT CONTRAST TECHNIQUE: Contiguous axial images were obtained from the base of the skull through the vertex without intravenous contrast. COMPARISON:  None. FINDINGS: Brain: Moderate brain atrophy. Mild chronic small-vessel change of the hemispheric white matter. No sign of acute infarction, mass lesion, hemorrhage, hydrocephalus or extra-axial collection. Vascular: There is atherosclerotic calcification of the major vessels at the base of the brain. Skull: Negative Sinuses/Orbits: Clear/normal Other: None ASPECTS (  [place] Stroke Program Early CT Score) - Ganglionic level infarction (caudate, lentiform nuclei, internal capsule, insula, M1-M3 cortex): 7 - Supraganglionic infarction (M4-M6 cortex): 3 Total score (0-10 with 10 being normal): 10 IMPRESSION: 1. No acute CT finding. Atrophy and mild chronic small-vessel change of the  hemispheric white matter. 2. ASPECTS is 10. 3. These results were called by telephone at the time of interpretation on 08/13/2019 at 8:34 pm to provider Duffy Bruce , who verbally acknowledged these results. Electronically Signed   By: Nelson Chimes M.D.   On: 08/13/2019 20:35    Medications:  I have reviewed the patient's current medications. Scheduled: . aspirin EC  81 mg Oral Daily  . benztropine  1 mg Oral BID  . busPIRone  10 mg Oral TID  . Chlorhexidine Gluconate Cloth  6 each Topical Daily  . cholecalciferol  1,000 Units Oral Daily  . docusate sodium  100 mg Oral Daily  . enoxaparin (LOVENOX) injection  40 mg Subcutaneous Q12H  . ethacrynic acid  75 mg Oral BID  . hydrALAZINE  25 mg Oral Q6H  . insulin aspart  0-20 Units Subcutaneous TID WC  . insulin aspart  0-5 Units Subcutaneous QHS  . melatonin  5 mg Oral QHS  . pantoprazole  40 mg Oral Daily  . perphenazine  1 mg Oral BID  . vitamin B-12  1,000 mcg Oral Daily  . vitamin B-12  1,000 mcg Oral Q M,W,F    Assessment/Plan: 69 y.o. 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  Symptoms now resolved.  Both occurred after sleeping and may be TIA related to her poorly controlled OSA or may be related to her poorly controlled OSA solely.  MRI of the brain personally reviewed and shows no acute changes.  Echocardiogram shows no cardiac source of emboli with an EF of 30-35%.  A1c 8.3, LDL 136.  Patient on ASA daily.    Stroke Risk Factors - diabetes mellitus, hypertension and OSA  Plan: 1. Statin for lipid management with target LDL<70. 2. PT consult, OT consult, Speech consult 3. Carotid dopplers pending 4. Continue Plavix 75mg  daily 5. Telemetry monitoring 6. Frequent neuro checks 7. Evaluation for other options  for control of OSA 8. Blood sugar management with target A1c<7.0    LOS: 1 day   Alexis Goodell, MD Neurology [phone] 08/15/2019  10:35 AM

## 2019-08-15 NOTE — TOC Progression Note (Signed)
Transition of Care [facility]) - Progression Note    Patient Details  Name: Chirstine Defrain MRN: [id number] Date of Birth: 1951-01-04  [facility]) CM/SW Contact  Shelbie Hutching, RN Phone Number: 08/15/2019, 11:53 AM  Clinical Narrative:    [facility] transport has been arranged by this RNCM.    Expected Discharge Plan: [place] Barriers to Discharge: Barriers Resolved  Expected Discharge Plan and Services Expected Discharge Plan: [place]   Discharge Planning Services: CM Consult Post Acute Care Choice: Resumption of Svcs/PTA Provider Living arrangements for the past 2 months: [place] Expected Discharge Date: 08/15/19                                     Social Determinants of Health (SDOH) Interventions    Readmission Risk Interventions No flowsheet data found.

## 2019-08-15 NOTE — Plan of Care (Signed)
  Problem: Education: Goal: Knowledge of General Education information will improve Description: Including pain rating scale, medication(s)/side effects and non-pharmacologic comfort measures Outcome: Progressing   Problem: Health Behavior/Discharge Planning: Goal: Ability to manage health-related needs will improve Outcome: Progressing   Problem: Clinical Measurements: Goal: Ability to maintain clinical measurements within normal limits will improve Outcome: Progressing Goal: Will remain free from infection Outcome: Progressing Goal: Diagnostic test results will improve Outcome: Progressing Goal: Respiratory complications will improve Outcome: Progressing Goal: Cardiovascular complication will be avoided Outcome: Progressing   Problem: Activity: Goal: Risk for activity intolerance will decrease Outcome: Progressing   Problem: Nutrition: Goal: Adequate nutrition will be maintained Outcome: Progressing   Problem: Coping: Goal: Level of anxiety will decrease Outcome: Progressing   Problem: Elimination: Goal: Will not experience complications related to bowel motility Outcome: Progressing Goal: Will not experience complications related to urinary retention Outcome: Progressing   Problem: Pain Managment: Goal: General experience of comfort will improve Outcome: Progressing   Problem: Safety: Goal: Ability to remain free from injury will improve Outcome: Progressing   Problem: Skin Integrity: Goal: Risk for impaired skin integrity will decrease Outcome: Progressing   Problem: Education: Goal: Knowledge of disease or condition will improve Outcome: Progressing Goal: Knowledge of secondary prevention will improve Outcome: Progressing Goal: Knowledge of patient specific risk factors addressed and post discharge goals established will improve Outcome: Progressing Goal: Individualized Educational Video(s) Outcome: Progressing   Problem: Coping: Goal: Will verbalize  positive feelings about self Outcome: Progressing Goal: Will identify appropriate support needs Outcome: Progressing   Problem: Health Behavior/Discharge Planning: Goal: Ability to manage health-related needs will improve Outcome: Progressing   Problem: Self-Care: Goal: Ability to participate in self-care as condition permits will improve Outcome: Progressing Goal: Verbalization of feelings and concerns over difficulty with self-care will improve Outcome: Progressing Goal: Ability to communicate needs accurately will improve Outcome: Progressing   Problem: Ischemic Stroke/TIA Tissue Perfusion: Goal: Complications of ischemic stroke/TIA will be minimized Outcome: Progressing

## 2019-09-13 ENCOUNTER — Ambulatory Visit: Payer: Medicare PPO | Admitting: Cardiology

## 2019-10-17 ENCOUNTER — Inpatient Hospital Stay: Payer: Medicare PPO

## 2019-10-17 ENCOUNTER — Telehealth: Payer: Self-pay | Admitting: *Deleted

## 2019-10-17 ENCOUNTER — Inpatient Hospital Stay: Payer: Medicare PPO | Attending: Oncology | Admitting: Oncology

## 2019-10-17 ENCOUNTER — Other Ambulatory Visit: Payer: Self-pay

## 2019-10-17 ENCOUNTER — Encounter: Payer: Self-pay | Admitting: Oncology

## 2019-10-17 VITALS — BP 147/92 | HR 86 | Temp 96.3°F | Resp 16

## 2019-10-17 DIAGNOSIS — E875 Hyperkalemia: Secondary | ICD-10-CM | POA: Diagnosis not present

## 2019-10-17 DIAGNOSIS — Z79899 Other long term (current) drug therapy: Secondary | ICD-10-CM | POA: Insufficient documentation

## 2019-10-17 DIAGNOSIS — G4733 Obstructive sleep apnea (adult) (pediatric): Secondary | ICD-10-CM | POA: Diagnosis not present

## 2019-10-17 DIAGNOSIS — Z9981 Dependence on supplemental oxygen: Secondary | ICD-10-CM

## 2019-10-17 DIAGNOSIS — D509 Iron deficiency anemia, unspecified: Secondary | ICD-10-CM | POA: Diagnosis not present

## 2019-10-17 DIAGNOSIS — D631 Anemia in chronic kidney disease: Secondary | ICD-10-CM | POA: Diagnosis present

## 2019-10-17 DIAGNOSIS — Z833 Family history of diabetes mellitus: Secondary | ICD-10-CM | POA: Diagnosis not present

## 2019-10-17 DIAGNOSIS — Z8249 Family history of ischemic heart disease and other diseases of the circulatory system: Secondary | ICD-10-CM | POA: Diagnosis not present

## 2019-10-17 DIAGNOSIS — Z794 Long term (current) use of insulin: Secondary | ICD-10-CM | POA: Insufficient documentation

## 2019-10-17 DIAGNOSIS — Z85828 Personal history of other malignant neoplasm of skin: Secondary | ICD-10-CM | POA: Diagnosis not present

## 2019-10-17 DIAGNOSIS — N179 Acute kidney failure, unspecified: Secondary | ICD-10-CM | POA: Diagnosis present

## 2019-10-17 DIAGNOSIS — J449 Chronic obstructive pulmonary disease, unspecified: Secondary | ICD-10-CM | POA: Diagnosis not present

## 2019-10-17 DIAGNOSIS — D649 Anemia, unspecified: Secondary | ICD-10-CM

## 2019-10-17 DIAGNOSIS — I10 Essential (primary) hypertension: Secondary | ICD-10-CM | POA: Diagnosis not present

## 2019-10-17 LAB — RETICULOCYTES
Immature Retic Fract: 26.7 % — ABNORMAL HIGH (ref 2.3–15.9)
RBC.: 3.15 MIL/uL — ABNORMAL LOW (ref 3.87–5.11)
Retic Count, Absolute: 53.2 10*3/uL (ref 19.0–186.0)
Retic Ct Pct: 1.7 % (ref 0.4–3.1)

## 2019-10-17 LAB — CBC WITH DIFFERENTIAL/PLATELET
Abs Immature Granulocytes: 0.04 10*3/uL (ref 0.00–0.07)
Basophils Absolute: 0 10*3/uL (ref 0.0–0.1)
Basophils Relative: 1 %
Eosinophils Absolute: 0.2 10*3/uL (ref 0.0–0.5)
Eosinophils Relative: 3 %
HCT: 29.1 % — ABNORMAL LOW (ref 36.0–46.0)
Hemoglobin: 8.6 g/dL — ABNORMAL LOW (ref 12.0–15.0)
Immature Granulocytes: 1 %
Lymphocytes Relative: 16 %
Lymphs Abs: 1.2 10*3/uL (ref 0.7–4.0)
MCH: 27.5 pg (ref 26.0–34.0)
MCHC: 29.6 g/dL — ABNORMAL LOW (ref 30.0–36.0)
MCV: 93 fL (ref 80.0–100.0)
Monocytes Absolute: 1 10*3/uL (ref 0.1–1.0)
Monocytes Relative: 13 %
Neutro Abs: 5 10*3/uL (ref 1.7–7.7)
Neutrophils Relative %: 66 %
Platelets: 286 10*3/uL (ref 150–400)
RBC: 3.13 MIL/uL — ABNORMAL LOW (ref 3.87–5.11)
RDW: 15.1 % (ref 11.5–15.5)
WBC: 7.4 10*3/uL (ref 4.0–10.5)
nRBC: 0 % (ref 0.0–0.2)

## 2019-10-17 LAB — COMPREHENSIVE METABOLIC PANEL
ALT: 7 U/L (ref 0–44)
AST: 14 U/L — ABNORMAL LOW (ref 15–41)
Albumin: 2.7 g/dL — ABNORMAL LOW (ref 3.5–5.0)
Alkaline Phosphatase: 60 U/L (ref 38–126)
Anion gap: 11 (ref 5–15)
BUN: 50 mg/dL — ABNORMAL HIGH (ref 8–23)
CO2: 33 mmol/L — ABNORMAL HIGH (ref 22–32)
Calcium: 8.6 mg/dL — ABNORMAL LOW (ref 8.9–10.3)
Chloride: 94 mmol/L — ABNORMAL LOW (ref 98–111)
Creatinine, Ser: 3.05 mg/dL — ABNORMAL HIGH (ref 0.44–1.00)
GFR calc Af Amer: 18 mL/min — ABNORMAL LOW (ref >60)
GFR calc non Af Amer: 15 mL/min — ABNORMAL LOW (ref >60)
Glucose, Bld: 103 mg/dL — ABNORMAL HIGH (ref 70–99)
Potassium: 5.8 mmol/L — ABNORMAL HIGH (ref 3.5–5.1)
Sodium: 138 mmol/L (ref 135–145)
Total Bilirubin: 1 mg/dL (ref 0.3–1.2)
Total Protein: 7.8 g/dL (ref 6.5–8.1)

## 2019-10-17 LAB — FERRITIN: Ferritin: 76 ng/mL (ref 11–307)

## 2019-10-17 LAB — IRON AND TIBC
Iron: 33 ug/dL (ref 28–170)
Saturation Ratios: 17 % (ref 10.4–31.8)
TIBC: 192 ug/dL — ABNORMAL LOW (ref 250–450)
UIBC: 159 ug/dL

## 2019-10-17 LAB — FOLATE: Folate: 16.1 ng/mL (ref >5.9)

## 2019-10-17 LAB — TSH: TSH: 3.659 u[IU]/mL (ref 0.350–4.500)

## 2019-10-17 NOTE — Telephone Encounter (Signed)
Dr. Janese Banks wanted me to get in touch with [facility]) because pt was new pt today and when she had labs drawn she had potassium of 5.8. I called and was on hold for 39 min. And no one had picked up after the initial putting me on hold. I used my cell and called back and told person on phone that I had been holding so long and no one coming on the line. I gave them my cell phone and at 5 pm got a call back from [place] a Therapist, sports at facility. I explained that I would fax over the results of the potassium and Dr. Janese Banks wanted pt to have kayexalate 1-2 doses and would need potassium rechecked tom. Ellison Hughs said she would call the on call doctor for the facility and tell him the results and recommendations for the issue. She said maybe the doctors may need to talk and I gave her the cell phone # for Janese Banks and she will talk to MD on call and have that doctor to call Janese Banks. Dr. Janese Banks made aware of this

## 2019-10-17 NOTE — Progress Notes (Signed)
Pt tired and feels weak and states that she feels like chemicals in her system making her feel bad.she is bed bound due to a left leg wound that she has had for 2 1/2 years.

## 2019-10-18 LAB — HAPTOGLOBIN: Haptoglobin: 363 mg/dL — ABNORMAL HIGH (ref 37–355)

## 2019-10-21 ENCOUNTER — Encounter: Payer: Self-pay | Admitting: Oncology

## 2019-10-21 ENCOUNTER — Other Ambulatory Visit: Payer: Self-pay | Admitting: Oncology

## 2019-10-21 DIAGNOSIS — D508 Other iron deficiency anemias: Secondary | ICD-10-CM

## 2019-10-21 DIAGNOSIS — D509 Iron deficiency anemia, unspecified: Secondary | ICD-10-CM | POA: Insufficient documentation

## 2019-10-21 LAB — MULTIPLE MYELOMA PANEL, SERUM
Albumin SerPl Elph-Mcnc: 2.7 g/dL — ABNORMAL LOW (ref 2.9–4.4)
Albumin/Glob SerPl: 0.7 (ref 0.7–1.7)
Alpha 1: 0.4 g/dL (ref 0.0–0.4)
Alpha2 Glob SerPl Elph-Mcnc: 1 g/dL (ref 0.4–1.0)
B-Globulin SerPl Elph-Mcnc: 1.2 g/dL (ref 0.7–1.3)
Gamma Glob SerPl Elph-Mcnc: 1.3 g/dL (ref 0.4–1.8)
Globulin, Total: 4 g/dL — ABNORMAL HIGH (ref 2.2–3.9)
IgA: 594 mg/dL — ABNORMAL HIGH (ref 87–352)
IgG (Immunoglobin G), Serum: 1332 mg/dL (ref 586–1602)
IgM (Immunoglobulin M), Srm: 141 mg/dL (ref 26–217)
Total Protein ELP: 6.7 g/dL (ref 6.0–8.5)

## 2019-10-21 NOTE — Progress Notes (Signed)
Hematology/Oncology Consult note [facility] Telephone:([phone] [phone]  Patient Care Team: [name], MD as PCP - General (Internal Medicine) Karin Golden, MD as Referring Physician (Dermatology) Eppie Gibson, MD as Attending Physician (Radiation Oncology) Malmfelt, Stephani Police, RN as Oncology Nurse Navigator (Oncology)   Name of the patient: Gabrielle [name]  [id number]  04/28/1950    Reason for referral-anemia   Referring physician-Dr. Renold Genta  Date of visit: 10/21/19   History of presenting illness- Patient is a 69 year old female with a past medical history significant for hypertension COPD obstructive sleep apnea and morbid obesity.  She has been a resident of [facility] over the last 3 to 4 years.  She is essentially bedbound and states that she cannot ambulate as her lower extremity wound will open up when she does so.  Despite being bedbound patient states that she was feeling more energetic in the past but over the last 6 months she feels more fatigued.  Appetite is fair and she feels like she has lost weight but we are unable to weigh her today.  She has been referred to [place] for anemia.  Recent blood work from 10/02/2019 showed creatinine of 2.4 potassium of 4.9 and low albumin of 2.9.  Liver numbers were normal.  H&H was 9.6 and has decreased from 13 in March 2019.  Iron studies showed ferritin of 48 and low iron of 16.  Hemoglobin A1c elevated at 8.  ECOG PS- 4  Pain scale- 3   Review of systems- Review of Systems  Constitutional: Positive for malaise/fatigue. Negative for chills, fever and weight loss.  HENT: Negative for congestion, ear discharge and nosebleeds.   Eyes: Negative for blurred vision.  Respiratory: Negative for cough, hemoptysis, sputum production, shortness of breath and wheezing.   Cardiovascular: Negative for chest pain, palpitations, orthopnea and claudication.  Gastrointestinal: Negative for  abdominal pain, blood in stool, constipation, diarrhea, heartburn, melena, nausea and vomiting.  Genitourinary: Negative for dysuria, flank pain, frequency, hematuria and urgency.  Musculoskeletal: Negative for back pain, joint pain and myalgias.       Bilateral lower extremity swelling.  Wound over left lower extremity  Skin: Negative for rash.  Neurological: Negative for dizziness, tingling, focal weakness, seizures, weakness and headaches.  Endo/Heme/Allergies: Does not bruise/bleed easily.  Psychiatric/Behavioral: Negative for depression and suicidal ideas. The patient does not have insomnia.     Allergies  Allergen Reactions  . Lasix [Furosemide]     Numbness and tingling of the face   . Ultram [Tramadol] Other (See Comments)    Pt reports, "makes my head feel funny."  . Erythromycin Rash  . Plasticized Base [Plastibase] Rash  . Tetracyclines & Related Rash    Patient Active Problem List   Diagnosis Date Noted  . COPD with chronic bronchitis ([place]) 08/14/2019  . OSA (obstructive sleep apnea) 08/14/2019  . BMI 50.0-59.9, adult ([place]) 08/14/2019  . UTI (urinary tract infection) 08/14/2019  . Aphasia 08/13/2019  . 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 Cellulitis 04/29/2016  . Acute on chronic kidney failure ([place]) 10/02/2015  . Diabetes ([place]) 10/01/2015  . SOB (shortness of breath) 10/01/2015  . AKI (acute kidney injury) ([place]) 10/01/2015  . Hyperkalemia 07/21/2015  . Uncontrolled hypertension 07/21/2015     Past Medical History:  Diagnosis Date  . Allergy    seasonal allergies  . Anemia    anemia of chronic kidney disease  . Arthritis   . Cellulitis and abscess of left  leg   . Chronic kidney disease    she reports grade 2 kidney disease  . Chronic venous stasis dermatitis of both lower extremities   . Diabetes mellitus without complication ([place])   . Hypertension   . Morbid obesity ([place])   . Osteoarthritis   . Oxygen dependent   . Skin cancer   . Sleep  apnea      Past Surgical History:  Procedure Laterality Date  . NO PAST SURGERIES    . none      Social History   Socioeconomic History  . Marital status: Single    Spouse name: Not on file  . Number of children: Not on file  . Years of education: Not on file  . Highest education level: Not on file  Occupational History  . Occupation: disabled  Tobacco Use  . Smoking status: Never Smoker  . Smokeless tobacco: Never Used  Vaping Use  . Vaping Use: Never used  Substance and Sexual Activity  . Alcohol use: No  . Drug use: No  . Sexual activity: Not Currently  Other Topics Concern  . Not on file  Social History Narrative  . Not on file   Social Determinants of Health   Financial Resource Strain:   . Difficulty of Paying Living Expenses: Not on file  Food Insecurity:   . Worried About Charity fundraiser in the Last Year: Not on file  . Ran Out of Food in the Last Year: Not on file  Transportation Needs:   . Lack of Transportation (Medical): Not on file  . Lack of Transportation (Non-Medical): Not on file  Physical Activity:   . Days of Exercise per Week: Not on file  . Minutes of Exercise per Session: Not on file  Stress:   . Feeling of Stress : Not on file  Social Connections:   . Frequency of Communication with Friends and Family: Not on file  . Frequency of Social Gatherings with Friends and Family: Not on file  . Attends Religious Services: Not on file  . Active Member of Clubs or Organizations: Not on file  . Attends Archivist Meetings: Not on file  . Marital Status: Not on file  Intimate Partner Violence:   . Fear of Current or Ex-Partner: Not on file  . Emotionally Abused: Not on file  . Physically Abused: Not on file  . Sexually Abused: Not on file     Family History  Problem Relation Age of Onset  . Diabetes Mellitus II Father   . CAD Father   . CAD Mother   . Hiatal hernia Mother      Current Outpatient Medications:  .   acetaminophen (TYLENOL) 500 MG tablet, Take 500 mg by mouth 3 (three) times daily., Disp: , Rfl:  .  benztropine (COGENTIN) 1 MG tablet, Take 1 mg by mouth 2 (two) times daily. , Disp: , Rfl:  .  bisacodyl (DULCOLAX) 5 MG EC tablet, Take 10 mg by mouth daily as needed for moderate constipation or severe constipation., Disp: , Rfl:  .  busPIRone (BUSPAR) 10 MG tablet, Take 10 mg by mouth 3 (three) times daily. , Disp: , Rfl:  .  Cholecalciferol (VITAMIN D3) 1000 units CAPS, Take 1,000 Units by mouth daily. , Disp: , Rfl:  .  clobetasol cream (TEMOVATE) 1.44 %, Apply 1 application topically in the morning and at bedtime. (apply to scalp, legs and arms), Disp: , Rfl:  .  clopidogrel (PLAVIX) 75  MG tablet, Take 1 tablet (75 mg total) by mouth daily., Disp: 30 tablet, Rfl: 0 .  docusate sodium (COLACE) 100 MG capsule, Take 100 mg by mouth daily. , Disp: , Rfl:  .  docusate sodium (COLACE) 100 MG capsule, Take 100 mg by mouth every 8 (eight) hours as needed for mild constipation or moderate constipation., Disp: , Rfl:  .  empagliflozin (JARDIANCE) 25 MG TABS tablet, Take 1 tablet (25 mg total) by mouth daily before breakfast., Disp: 30 tablet, Rfl: 0 .  ethacrynic acid (EDECRIN) 25 MG tablet, Take 3 tablets (75 mg total) by mouth 2 (two) times daily., Disp: 60 tablet, Rfl: 0 .  Exenatide ER (BYDUREON BCISE) 2 MG/0.85ML AUIJ, Inject 2 mg into the skin every Wednesday., Disp: , Rfl:  .  fluticasone (FLONASE) 50 MCG/ACT nasal spray, Place 1 spray into both nostrils every 12 (twelve) hours as needed for allergies. , Disp: , Rfl:  .  HUMALOG 100 UNIT/ML cartridge, Inject 5 Units into the skin See admin instructions. Inject 5u under the skin three times daily with meals with sliding scale, Disp: , Rfl:  .  hydrALAZINE (APRESOLINE) 25 MG tablet, Take 25 mg by mouth 4 (four) times daily. , Disp: , Rfl:  .  HYDROcodone-acetaminophen (NORCO/VICODIN) 5-325 MG tablet, Take 1 tablet by mouth every 6 (six) hours as  needed for up to 4 doses for moderate pain or severe pain., Disp: 4 tablet, Rfl: 0 .  insulin glargine (LANTUS) 100 UNIT/ML injection, Inject 0.85 mLs (85 Units total) into the skin daily. (Patient taking differently: Inject 43 Units into the skin 2 (two) times daily. ), Disp: 10 mL, Rfl: 11 .  ipratropium-albuterol (DUONEB) 0.5-2.5 (3) MG/3ML SOLN, Take 3 mLs by nebulization every 4 (four) hours as needed (congestion)., Disp: , Rfl:  .  Melatonin 1 MG TABS, Take 3 mg by mouth at bedtime. , Disp: , Rfl:  .  nabumetone (RELAFEN) 500 MG tablet, Take 500 mg by mouth daily. , Disp: , Rfl:  .  nitroGLYCERIN (NITROSTAT) 0.4 MG SL tablet, Place 0.4 mg under the tongue every 5 (five) minutes as needed for chest pain., Disp: , Rfl:  .  nystatin (MYCOSTATIN/NYSTOP) powder, Apply 1 application topically 2 (two) times daily. (apply to perennial area), Disp: , Rfl:  .  nystatin cream (MYCOSTATIN), Apply 1 application topically 2 (two) times daily. (apply to perennial area), Disp: , Rfl:  .  omeprazole (PRILOSEC) 20 MG capsule, Take 20 mg by mouth every other day., Disp: , Rfl:  .  perphenazine (TRILAFON) 2 MG tablet, Take 1 mg by mouth 2 (two) times daily. , Disp: , Rfl:  .  polyethylene glycol (MIRALAX / GLYCOLAX) packet, Take 17 g by mouth daily., Disp: , Rfl:  .  sodium chloride (OCEAN) 0.65 % SOLN nasal spray, Place 2 sprays into both nostrils every 8 (eight) hours as needed for congestion., Disp: , Rfl:  .  vitamin B-12 (CYANOCOBALAMIN) 1000 MCG tablet, Take 1,000 mcg by mouth every Monday, Wednesday, and Friday., Disp: , Rfl:    Physical exam:  Vitals:   10/17/19 1423  BP: (!) 147/92  Pulse: 86  Resp: 16  Temp: (!) 96.3 F (35.7 C)  TempSrc: Oral   Physical Exam Constitutional:      Comments: Patient is morbidly obese.  She is strapped to a stretcher.  Appears tearful but in no acute distress  Cardiovascular:     Rate and Rhythm: Normal rate and regular rhythm.  Heart sounds: Normal heart  sounds.  Pulmonary:     Effort: Pulmonary effort is normal.     Breath sounds: Normal breath sounds.  Abdominal:     Palpations: Abdomen is soft.     Comments: Obese  Musculoskeletal:     Comments: Dressing in place over left lower extremity  Skin:    General: Skin is warm and dry.  Neurological:     Mental Status: She is alert and oriented to person, place, and time.        CMP Latest Ref Rng & Units 10/17/2019  Glucose 70 - 99 mg/dL 103(H)  BUN 8 - 23 mg/dL 50(H)  Creatinine 0.44 - 1.00 mg/dL 3.05(H)  Sodium 135 - 145 mmol/L 138  Potassium 3.5 - 5.1 mmol/L 5.8(H)  Chloride 98 - 111 mmol/L 94(L)  CO2 22 - 32 mmol/L 33(H)  Calcium 8.9 - 10.3 mg/dL 8.6(L)  Total Protein 6.5 - 8.1 g/dL 7.8  Total Bilirubin 0.3 - 1.2 mg/dL 1.0  Alkaline Phos 38 - 126 U/L 60  AST 15 - 41 U/L 14(L)  ALT 0 - 44 U/L 7   CBC Latest Ref Rng & Units 10/17/2019  WBC 4.0 - 10.5 K/uL 7.4  Hemoglobin 12.0 - 15.0 g/dL 8.6(L)  Hematocrit 36 - 46 % 29.1(L)  Platelets 150 - 400 K/uL 286     Assessment and plan- Patient is a 69 y.o. female with hypertension diabetes and CKD referred for anemia  I will do a complete anemia work-up including CBC with differential, CMP, ferritin and iron studies, folate, TSH, haptoglobin, reticulocyte count and myeloma panel.  Recent B12 levels were normal.  If her iron saturation is less than 20% or ferritin less than 200 I will give her a trial of IV iron before considering EPO for her CKD.  Discussed what erythropoietin is and possible side effects including all but not limited to possible risk of thromboembolic disease when hemoglobin is aimed higher greater than 11.  I will see her back next week and she will receive her first dose of IV iron at that time.  After the patient left our clinic her potassium came back at 5.8.  Due to ongoing Covid pandemic and ERs at their capacity we have reached out to the nursing home to give her Kayexalate daily for the next 5 days.  We  have also conveyed to them to repeat the potassium at the nursing home and we will be repeating it as well when she gets here next week   Thank you for this kind referral and the opportunity to participate in the care of this patient   Visit Diagnosis 1. Normocytic anemia   2. Anemia of chronic kidney failure, unspecified stage   3. Hyperkalemia     Dr. Randa Evens, MD, MPH [facility] [phone] 10/21/2019 8:46 AM

## 2019-10-24 ENCOUNTER — Inpatient Hospital Stay (HOSPITAL_BASED_OUTPATIENT_CLINIC_OR_DEPARTMENT_OTHER): Payer: Medicare PPO | Admitting: Oncology

## 2019-10-24 ENCOUNTER — Other Ambulatory Visit: Payer: Self-pay

## 2019-10-24 ENCOUNTER — Inpatient Hospital Stay: Payer: Medicare PPO

## 2019-10-24 ENCOUNTER — Telehealth: Payer: Self-pay

## 2019-10-24 VITALS — BP 132/59 | HR 74 | Temp 96.9°F | Resp 18

## 2019-10-24 VITALS — BP 144/73 | HR 75 | Resp 18

## 2019-10-24 DIAGNOSIS — D509 Iron deficiency anemia, unspecified: Secondary | ICD-10-CM

## 2019-10-24 DIAGNOSIS — D631 Anemia in chronic kidney disease: Secondary | ICD-10-CM | POA: Diagnosis not present

## 2019-10-24 DIAGNOSIS — N189 Chronic kidney disease, unspecified: Secondary | ICD-10-CM | POA: Diagnosis not present

## 2019-10-24 DIAGNOSIS — D508 Other iron deficiency anemias: Secondary | ICD-10-CM

## 2019-10-24 MED ORDER — SODIUM CHLORIDE 0.9 % IV SOLN
510.0000 mg | Freq: Once | INTRAVENOUS | Status: AC
  Administered 2019-10-24: 510 mg via INTRAVENOUS
  Filled 2019-10-24: qty 510

## 2019-10-24 MED ORDER — SODIUM CHLORIDE 0.9 % IV SOLN
Freq: Once | INTRAVENOUS | Status: AC
  Filled 2019-10-24: qty 250

## 2019-10-24 NOTE — Telephone Encounter (Signed)
Per the nurse at  [facility] patient's potassium level is 4.0

## 2019-10-24 NOTE — Progress Notes (Signed)
Patient monitored x 30 minutes post Fereheme infusion. Patient tolerated well. Patient and VSS. Discharged home via EMS transport.

## 2019-10-26 ENCOUNTER — Encounter: Payer: Self-pay | Admitting: Oncology

## 2019-10-26 NOTE — Progress Notes (Signed)
Hematology/Oncology Consult note [facility]  [phone] [phone]  Patient Care Team: [name], MD as PCP - General (Internal Medicine) Karin Golden, MD as Referring Physician (Dermatology) Eppie Gibson, MD as Attending Physician (Radiation Oncology) Malmfelt, Stephani Police, RN as Oncology Nurse Navigator (Oncology)   Name of the patient: Gabrielle [name]  [id number]  1950/10/02   Date of visit: 10/26/19  Diagnosis-anemia of chronic kidney disease along with a component of iron deficiency  Chief complaint/ Reason for visit-discuss results of blood work and receive IV iron  Heme/Onc history: Patient is a 69 year old female with a past medical history significant for hypertension COPD obstructive sleep apnea and morbid obesity.  She has been a resident of [facility] over the last 3 to 4 years.  She is essentially bedbound and states that she cannot ambulate as her lower extremity wound will open up when she does so.  Despite being bedbound patient states that she was feeling more energetic in the past but over the last 6 months she feels more fatigued.  Appetite is fair and she feels like she has lost weight but we are unable to weigh her today.  She has been referred to [place] for anemia.  Recent blood work from 10/02/2019 showed creatinine of 2.4 potassium of 4.9 and low albumin of 2.9.  Liver numbers were normal.  H&H was 9.6 and has decreased from 13 in March 2019.  Iron studies showed ferritin of 48 and low iron of 16.  Hemoglobin A1c elevated at 8.  Results of blood work from 10/17/2019 were as follows: CBC showed white count of 7.4, H&H of 8.6/29.1 with an MCV of 93 and a platelet count of 286.  CMP showed elevated serum creatinine of 3 and elevated potassium of 5.8 folate levels were normal.  Ferritin was 76 with an iron saturation of 17%.  Reticulocyte count was low for the degree of anemia at 1.7.  Multiple myeloma panel showed  polyclonal increase in proteins.  Haptoglobin was elevated at 363 and TSH was normal.  Interval history-patient continues to feel fatigued.  She did complete her course of Kayexalate given for hyperkalemia in the nursing home  ECOG PS- 4 Pain scale- 3   Review of systems- Review of Systems  Constitutional: Positive for malaise/fatigue. Negative for chills, fever and weight loss.  HENT: Negative for congestion, ear discharge and nosebleeds.   Eyes: Negative for blurred vision.  Respiratory: Negative for cough, hemoptysis, sputum production, shortness of breath and wheezing.   Cardiovascular: Positive for leg swelling. Negative for chest pain, palpitations, orthopnea and claudication.  Gastrointestinal: Negative for abdominal pain, blood in stool, constipation, diarrhea, heartburn, melena, nausea and vomiting.  Genitourinary: Negative for dysuria, flank pain, frequency, hematuria and urgency.  Musculoskeletal: Negative for back pain, joint pain and myalgias.  Skin: Negative for rash.  Neurological: Negative for dizziness, tingling, focal weakness, seizures, weakness and headaches.  Endo/Heme/Allergies: Does not bruise/bleed easily.  Psychiatric/Behavioral: Negative for depression and suicidal ideas. The patient does not have insomnia.        Allergies  Allergen Reactions  . Lasix [Furosemide]     Numbness and tingling of the face   . Ultram [Tramadol] Other (See Comments)    Pt reports, "makes my head feel funny."  . Erythromycin Rash  . Plasticized Base [Plastibase] Rash  . Tetracyclines & Related Rash     Past Medical History:  Diagnosis Date  . Allergy    seasonal allergies  .  Anemia    anemia of chronic kidney disease  . Arthritis   . Cellulitis and abscess of left leg   . Chronic kidney disease    she reports grade 2 kidney disease  . Chronic venous stasis dermatitis of both lower extremities   . Diabetes mellitus without complication ([place])   . Hypertension   .  Morbid obesity ([place])   . Osteoarthritis   . Oxygen dependent   . Skin cancer   . Sleep apnea      Past Surgical History:  Procedure Laterality Date  . NO PAST SURGERIES    . none      Social History   Socioeconomic History  . Marital status: Single    Spouse name: Not on file  . Number of children: Not on file  . Years of education: Not on file  . Highest education level: Not on file  Occupational History  . Occupation: disabled  Tobacco Use  . Smoking status: Never Smoker  . Smokeless tobacco: Never Used  Vaping Use  . Vaping Use: Never used  Substance and Sexual Activity  . Alcohol use: No  . Drug use: No  . Sexual activity: Not Currently  Other Topics Concern  . Not on file  Social History Narrative  . Not on file   Social Determinants of Health   Financial Resource Strain:   . Difficulty of Paying Living Expenses: Not on file  Food Insecurity:   . Worried About Charity fundraiser in the Last Year: Not on file  . Ran Out of Food in the Last Year: Not on file  Transportation Needs:   . Lack of Transportation (Medical): Not on file  . Lack of Transportation (Non-Medical): Not on file  Physical Activity:   . Days of Exercise per Week: Not on file  . Minutes of Exercise per Session: Not on file  Stress:   . Feeling of Stress : Not on file  Social Connections:   . Frequency of Communication with Friends and Family: Not on file  . Frequency of Social Gatherings with Friends and Family: Not on file  . Attends Religious Services: Not on file  . Active Member of Clubs or Organizations: Not on file  . Attends Archivist Meetings: Not on file  . Marital Status: Not on file  Intimate Partner Violence:   . Fear of Current or Ex-Partner: Not on file  . Emotionally Abused: Not on file  . Physically Abused: Not on file  . Sexually Abused: Not on file    Family History  Problem Relation Age of Onset  . Diabetes Mellitus II Father   . CAD Father   .  CAD Mother   . Hiatal hernia Mother      Current Outpatient Medications:  .  acetaminophen (TYLENOL) 500 MG tablet, Take 500 mg by mouth 3 (three) times daily., Disp: , Rfl:  .  benztropine (COGENTIN) 1 MG tablet, Take 1 mg by mouth 2 (two) times daily. , Disp: , Rfl:  .  bisacodyl (DULCOLAX) 5 MG EC tablet, Take 10 mg by mouth daily as needed for moderate constipation or severe constipation., Disp: , Rfl:  .  busPIRone (BUSPAR) 10 MG tablet, Take 10 mg by mouth 3 (three) times daily. , Disp: , Rfl:  .  Cholecalciferol (VITAMIN D3) 1000 units CAPS, Take 1,000 Units by mouth daily. , Disp: , Rfl:  .  clobetasol cream (TEMOVATE) 4.09 %, Apply 1 application topically in the  morning and at bedtime. (apply to scalp, legs and arms), Disp: , Rfl:  .  clopidogrel (PLAVIX) 75 MG tablet, Take 1 tablet (75 mg total) by mouth daily., Disp: 30 tablet, Rfl: 0 .  docusate sodium (COLACE) 100 MG capsule, Take 100 mg by mouth daily. , Disp: , Rfl:  .  docusate sodium (COLACE) 100 MG capsule, Take 100 mg by mouth every 8 (eight) hours as needed for mild constipation or moderate constipation., Disp: , Rfl:  .  empagliflozin (JARDIANCE) 25 MG TABS tablet, Take 1 tablet (25 mg total) by mouth daily before breakfast., Disp: 30 tablet, Rfl: 0 .  ethacrynic acid (EDECRIN) 25 MG tablet, Take 3 tablets (75 mg total) by mouth 2 (two) times daily., Disp: 60 tablet, Rfl: 0 .  Exenatide ER (BYDUREON BCISE) 2 MG/0.85ML AUIJ, Inject 2 mg into the skin every Wednesday., Disp: , Rfl:  .  fluticasone (FLONASE) 50 MCG/ACT nasal spray, Place 1 spray into both nostrils every 12 (twelve) hours as needed for allergies. , Disp: , Rfl:  .  HUMALOG 100 UNIT/ML cartridge, Inject 5 Units into the skin See admin instructions. Inject 5u under the skin three times daily with meals with sliding scale, Disp: , Rfl:  .  hydrALAZINE (APRESOLINE) 25 MG tablet, Take 25 mg by mouth 4 (four) times daily. , Disp: , Rfl:  .  HYDROcodone-acetaminophen  (NORCO/VICODIN) 5-325 MG tablet, Take 1 tablet by mouth every 6 (six) hours as needed for up to 4 doses for moderate pain or severe pain., Disp: 4 tablet, Rfl: 0 .  insulin glargine (LANTUS) 100 UNIT/ML injection, Inject 0.85 mLs (85 Units total) into the skin daily. (Patient taking differently: Inject 43 Units into the skin 2 (two) times daily. ), Disp: 10 mL, Rfl: 11 .  ipratropium-albuterol (DUONEB) 0.5-2.5 (3) MG/3ML SOLN, Take 3 mLs by nebulization every 4 (four) hours as needed (congestion)., Disp: , Rfl:  .  Melatonin 1 MG TABS, Take 3 mg by mouth at bedtime. , Disp: , Rfl:  .  nabumetone (RELAFEN) 500 MG tablet, Take 500 mg by mouth daily. , Disp: , Rfl:  .  nitroGLYCERIN (NITROSTAT) 0.4 MG SL tablet, Place 0.4 mg under the tongue every 5 (five) minutes as needed for chest pain., Disp: , Rfl:  .  nystatin (MYCOSTATIN/NYSTOP) powder, Apply 1 application topically 2 (two) times daily. (apply to perennial area), Disp: , Rfl:  .  nystatin cream (MYCOSTATIN), Apply 1 application topically 2 (two) times daily. (apply to perennial area), Disp: , Rfl:  .  omeprazole (PRILOSEC) 20 MG capsule, Take 20 mg by mouth every other day., Disp: , Rfl:  .  perphenazine (TRILAFON) 2 MG tablet, Take 1 mg by mouth 2 (two) times daily. , Disp: , Rfl:  .  polyethylene glycol (MIRALAX / GLYCOLAX) packet, Take 17 g by mouth daily., Disp: , Rfl:  .  sodium chloride (OCEAN) 0.65 % SOLN nasal spray, Place 2 sprays into both nostrils every 8 (eight) hours as needed for congestion., Disp: , Rfl:  .  vitamin B-12 (CYANOCOBALAMIN) 1000 MCG tablet, Take 1,000 mcg by mouth every Monday, Wednesday, and Friday., Disp: , Rfl:   Physical exam:  Vitals:   10/24/19 1122  BP: (!) 132/59  Pulse: 74  Resp: 18  Temp: (!) 96.9 F (36.1 C)  TempSrc: Tympanic  SpO2: 100%   Physical Exam Constitutional:      Comments: Patient is bedbound and strapped to a stretcher.  Obese.  Appears fatigued.  On  oxygen  Pulmonary:     Effort:  Pulmonary effort is normal.  Neurological:     Mental Status: She is alert and oriented to person, place, and time.      CMP Latest Ref Rng & Units 10/17/2019  Glucose 70 - 99 mg/dL 103(H)  BUN 8 - 23 mg/dL 50(H)  Creatinine 0.44 - 1.00 mg/dL 3.05(H)  Sodium 135 - 145 mmol/L 138  Potassium 3.5 - 5.1 mmol/L 5.8(H)  Chloride 98 - 111 mmol/L 94(L)  CO2 22 - 32 mmol/L 33(H)  Calcium 8.9 - 10.3 mg/dL 8.6(L)  Total Protein 6.5 - 8.1 g/dL 7.8  Total Bilirubin 0.3 - 1.2 mg/dL 1.0  Alkaline Phos 38 - 126 U/L 60  AST 15 - 41 U/L 14(L)  ALT 0 - 44 U/L 7   CBC Latest Ref Rng & Units 10/17/2019  WBC 4.0 - 10.5 K/uL 7.4  Hemoglobin 12.0 - 15.0 g/dL 8.6(L)  Hematocrit 36 - 46 % 29.1(L)  Platelets 150 - 400 K/uL 286    No images are attached to the encounter.  No results found.   Assessment and plan- Patient is a 69 y.o. female referred for anemia likely a combination of anemia of chronic kidney disease and iron deficiency  Discussed results of blood work with the patient which showed a ferritin of less than 200 and iron saturation of less than 20%.  Would therefore be reasonable to give her IV iron prior to attempting EPO for chronic kidney disease.  I would recommend 2 doses of Feraheme 510 mg IV weekly with the first dose being given today.  Discussed risks and benefits of Feraheme including all but not limited to headache, leg swelling and possible risk of infusion reaction.  Patient understands and agrees to proceed as planned.  I will repeat CBC ferritin and iron studies in 2 months.  At that time if her iron studies are normal and anemia persists consideration will be given to EPO every 3 weeks.  I have also informed the patient overall her kidney numbers are getting worse. Her creatinine is up to 3 she does see nephrology Dr. Percell Miller with [facility] and will continue to follow-up with them.  Hyperkalemia.  She received 3-4 doses of Kayexalate and repeat potassium check at the nursing home was  normal.    Visit Diagnosis 1. Anemia of chronic kidney failure, unspecified stage   2. Iron deficiency anemia, unspecified iron deficiency anemia type      Dr. Randa Evens, MD, MPH [facility] at [facility] [phone] 10/26/2019 4:43 PM

## 2019-10-31 ENCOUNTER — Inpatient Hospital Stay: Payer: Medicare PPO

## 2019-10-31 ENCOUNTER — Other Ambulatory Visit: Payer: Self-pay

## 2019-10-31 VITALS — BP 116/75 | HR 85 | Temp 97.6°F | Resp 18

## 2019-10-31 DIAGNOSIS — D509 Iron deficiency anemia, unspecified: Secondary | ICD-10-CM | POA: Diagnosis not present

## 2019-10-31 DIAGNOSIS — D508 Other iron deficiency anemias: Secondary | ICD-10-CM

## 2019-10-31 MED ORDER — SODIUM CHLORIDE 0.9 % IV SOLN
Freq: Once | INTRAVENOUS | Status: AC
  Filled 2019-10-31: qty 250

## 2019-10-31 MED ORDER — SODIUM CHLORIDE 0.9 % IV SOLN
510.0000 mg | Freq: Once | INTRAVENOUS | Status: AC
  Administered 2019-10-31: 510 mg via INTRAVENOUS
  Filled 2019-10-31: qty 510

## 2019-10-31 NOTE — Progress Notes (Signed)
Patient tolerated infusion well. Discharged home via EMS.

## 2019-12-17 ENCOUNTER — Telehealth: Payer: Self-pay | Admitting: *Deleted

## 2019-12-17 NOTE — Telephone Encounter (Signed)
Pt called and wanted to have labs done at [facility] so she would not have to come by ambulance. She then wants her appt with rao to a video. I spoke to [employer]. And told her that I would send orders in today. I faxed them to [phone] and got fax confirmation. I asked for the labs to be done no later than.11/15 and fax them to the number on fax sheet. I then called pt and let her know that we have cancelled her coming into cancer center and [facility] will draw labs. I kept her appt 11/18 10:45 but it will be video visit and we will contact pt on her cell phone [phone]

## 2019-12-25 ENCOUNTER — Telehealth: Payer: Self-pay | Admitting: *Deleted

## 2019-12-25 NOTE — Telephone Encounter (Signed)
I was sent a secure chat from [place] the scheduler that pt . Called and said that [facility] did not draw the labs that I faxed over and therefore she needs the video visit to be rescheduled and to call and speak to staff at [facility] health care center) to see when the labs came be done. I called pt and told her that I will call [facility] and see if we can get labs done and then reschedule her video appt. She is ok with this. I called [place] spoke to pt;s nurse Pamala Hurry and she says she did not know about her needing labs. I had spoke to nurse [place] on 11/8 and they would be glad to draw the labs and fax results to [place]. Pamala Hurry asked me to send request again and she will try to take care of it. I was out of the office today afer 1 pm so I will send it in am. Pamala Hurry says she will be at [facility] tomorrow so it is ok to send it then.

## 2019-12-26 ENCOUNTER — Ambulatory Visit: Payer: Medicare PPO | Admitting: Oncology

## 2019-12-26 ENCOUNTER — Other Ambulatory Visit: Payer: Medicare PPO

## 2019-12-26 ENCOUNTER — Inpatient Hospital Stay: Payer: Medicare PPO | Admitting: Oncology

## 2020-01-16 ENCOUNTER — Other Ambulatory Visit: Payer: Self-pay

## 2020-01-16 DIAGNOSIS — E871 Hypo-osmolality and hyponatremia: Secondary | ICD-10-CM | POA: Diagnosis present

## 2020-01-16 DIAGNOSIS — Z7401 Bed confinement status: Secondary | ICD-10-CM

## 2020-01-16 DIAGNOSIS — I5022 Chronic systolic (congestive) heart failure: Secondary | ICD-10-CM | POA: Diagnosis present

## 2020-01-16 DIAGNOSIS — G4733 Obstructive sleep apnea (adult) (pediatric): Secondary | ICD-10-CM | POA: Diagnosis present

## 2020-01-16 DIAGNOSIS — Z8249 Family history of ischemic heart disease and other diseases of the circulatory system: Secondary | ICD-10-CM

## 2020-01-16 DIAGNOSIS — E860 Dehydration: Secondary | ICD-10-CM | POA: Diagnosis present

## 2020-01-16 DIAGNOSIS — Z7984 Long term (current) use of oral hypoglycemic drugs: Secondary | ICD-10-CM

## 2020-01-16 DIAGNOSIS — E861 Hypovolemia: Secondary | ICD-10-CM | POA: Diagnosis present

## 2020-01-16 DIAGNOSIS — L89893 Pressure ulcer of other site, stage 3: Secondary | ICD-10-CM | POA: Diagnosis present

## 2020-01-16 DIAGNOSIS — J449 Chronic obstructive pulmonary disease, unspecified: Secondary | ICD-10-CM | POA: Diagnosis present

## 2020-01-16 DIAGNOSIS — Z833 Family history of diabetes mellitus: Secondary | ICD-10-CM

## 2020-01-16 DIAGNOSIS — L89152 Pressure ulcer of sacral region, stage 2: Secondary | ICD-10-CM | POA: Diagnosis present

## 2020-01-16 DIAGNOSIS — Z20822 Contact with and (suspected) exposure to covid-19: Secondary | ICD-10-CM | POA: Diagnosis present

## 2020-01-16 DIAGNOSIS — Z79890 Hormone replacement therapy (postmenopausal): Secondary | ICD-10-CM

## 2020-01-16 DIAGNOSIS — L309 Dermatitis, unspecified: Secondary | ICD-10-CM | POA: Diagnosis present

## 2020-01-16 DIAGNOSIS — N179 Acute kidney failure, unspecified: Principal | ICD-10-CM | POA: Diagnosis present

## 2020-01-16 DIAGNOSIS — Z79899 Other long term (current) drug therapy: Secondary | ICD-10-CM

## 2020-01-16 DIAGNOSIS — I130 Hypertensive heart and chronic kidney disease with heart failure and stage 1 through stage 4 chronic kidney disease, or unspecified chronic kidney disease: Secondary | ICD-10-CM | POA: Diagnosis present

## 2020-01-16 DIAGNOSIS — E1122 Type 2 diabetes mellitus with diabetic chronic kidney disease: Secondary | ICD-10-CM | POA: Diagnosis present

## 2020-01-16 DIAGNOSIS — Z9981 Dependence on supplemental oxygen: Secondary | ICD-10-CM

## 2020-01-16 DIAGNOSIS — Z6841 Body Mass Index (BMI) 40.0 and over, adult: Secondary | ICD-10-CM

## 2020-01-16 DIAGNOSIS — Z7902 Long term (current) use of antithrombotics/antiplatelets: Secondary | ICD-10-CM

## 2020-01-16 DIAGNOSIS — Z85828 Personal history of other malignant neoplasm of skin: Secondary | ICD-10-CM

## 2020-01-16 DIAGNOSIS — Z888 Allergy status to other drugs, medicaments and biological substances status: Secondary | ICD-10-CM

## 2020-01-16 DIAGNOSIS — N184 Chronic kidney disease, stage 4 (severe): Secondary | ICD-10-CM | POA: Diagnosis present

## 2020-01-16 DIAGNOSIS — J9611 Chronic respiratory failure with hypoxia: Secondary | ICD-10-CM | POA: Diagnosis present

## 2020-01-16 DIAGNOSIS — D631 Anemia in chronic kidney disease: Secondary | ICD-10-CM | POA: Diagnosis present

## 2020-01-16 DIAGNOSIS — R197 Diarrhea, unspecified: Secondary | ICD-10-CM | POA: Diagnosis present

## 2020-01-16 DIAGNOSIS — Z881 Allergy status to other antibiotic agents status: Secondary | ICD-10-CM

## 2020-01-16 DIAGNOSIS — E875 Hyperkalemia: Secondary | ICD-10-CM | POA: Diagnosis present

## 2020-01-16 DIAGNOSIS — L89892 Pressure ulcer of other site, stage 2: Secondary | ICD-10-CM | POA: Diagnosis present

## 2020-01-16 DIAGNOSIS — Z794 Long term (current) use of insulin: Secondary | ICD-10-CM

## 2020-01-16 LAB — CBC WITH DIFFERENTIAL/PLATELET
Abs Immature Granulocytes: 0.1 10*3/uL — ABNORMAL HIGH (ref 0.00–0.07)
Basophils Absolute: 0 10*3/uL (ref 0.0–0.1)
Basophils Relative: 0 %
Eosinophils Absolute: 0.5 10*3/uL (ref 0.0–0.5)
Eosinophils Relative: 5 %
HCT: 28.2 % — ABNORMAL LOW (ref 36.0–46.0)
Hemoglobin: 9.4 g/dL — ABNORMAL LOW (ref 12.0–15.0)
Immature Granulocytes: 1 %
Lymphocytes Relative: 17 %
Lymphs Abs: 1.8 10*3/uL (ref 0.7–4.0)
MCH: 30.7 pg (ref 26.0–34.0)
MCHC: 33.3 g/dL (ref 30.0–36.0)
MCV: 92.2 fL (ref 80.0–100.0)
Monocytes Absolute: 1.1 10*3/uL — ABNORMAL HIGH (ref 0.1–1.0)
Monocytes Relative: 10 %
Neutro Abs: 7.4 10*3/uL (ref 1.7–7.7)
Neutrophils Relative %: 67 %
Platelets: 479 10*3/uL — ABNORMAL HIGH (ref 150–400)
RBC: 3.06 MIL/uL — ABNORMAL LOW (ref 3.87–5.11)
RDW: 17.3 % — ABNORMAL HIGH (ref 11.5–15.5)
WBC: 11 10*3/uL — ABNORMAL HIGH (ref 4.0–10.5)
nRBC: 0 % (ref 0.0–0.2)

## 2020-01-16 LAB — COMPREHENSIVE METABOLIC PANEL
ALT: 11 U/L (ref 0–44)
AST: 15 U/L (ref 15–41)
Albumin: 1.6 g/dL — ABNORMAL LOW (ref 3.5–5.0)
Alkaline Phosphatase: 114 U/L (ref 38–126)
Anion gap: 15 (ref 5–15)
BUN: 95 mg/dL — ABNORMAL HIGH (ref 8–23)
CO2: 22 mmol/L (ref 22–32)
Calcium: 8.1 mg/dL — ABNORMAL LOW (ref 8.9–10.3)
Chloride: 89 mmol/L — ABNORMAL LOW (ref 98–111)
Creatinine, Ser: 4.59 mg/dL — ABNORMAL HIGH (ref 0.44–1.00)
GFR, Estimated: 10 mL/min — ABNORMAL LOW (ref >60)
Glucose, Bld: 194 mg/dL — ABNORMAL HIGH (ref 70–99)
Potassium: 5.1 mmol/L (ref 3.5–5.1)
Sodium: 126 mmol/L — ABNORMAL LOW (ref 135–145)
Total Bilirubin: 1 mg/dL (ref 0.3–1.2)
Total Protein: 7 g/dL (ref 6.5–8.1)

## 2020-01-16 LAB — RESP PANEL BY RT-PCR (FLU A&B, COVID) ARPGX2
Influenza A by PCR: NEGATIVE
Influenza B by PCR: NEGATIVE
SARS Coronavirus 2 by RT PCR: NEGATIVE

## 2020-01-16 LAB — TROPONIN I (HIGH SENSITIVITY): Troponin I (High Sensitivity): 14 ng/L (ref <18)

## 2020-01-16 LAB — BRAIN NATRIURETIC PEPTIDE: B Natriuretic Peptide: 219.3 pg/mL — ABNORMAL HIGH (ref 0.0–100.0)

## 2020-01-16 NOTE — ED Triage Notes (Signed)
Pt arrives [facility] from [facility] care w cc of abnormal labs. Pt reports not feeling well for last week. Labs checked this am, BUN was 45 first draw 95 second draw, creatinine 4, GFR 17 per EMS. Pt not dialysis pt. Hx chronic kidney disease.

## 2020-01-17 ENCOUNTER — Emergency Department: Payer: Medicare PPO

## 2020-01-17 ENCOUNTER — Inpatient Hospital Stay
Admission: EM | Admit: 2020-01-17 | Discharge: 2020-01-20 | DRG: 682 | Disposition: A | Discharge status: Skilled Nursing Facility | Payer: Medicare PPO | Source: Skilled Nursing Facility | Attending: Family Medicine | Admitting: Family Medicine

## 2020-01-17 ENCOUNTER — Inpatient Hospital Stay: Payer: Medicare PPO

## 2020-01-17 DIAGNOSIS — R197 Diarrhea, unspecified: Secondary | ICD-10-CM | POA: Diagnosis present

## 2020-01-17 DIAGNOSIS — Z20822 Contact with and (suspected) exposure to covid-19: Secondary | ICD-10-CM | POA: Diagnosis present

## 2020-01-17 DIAGNOSIS — Z7902 Long term (current) use of antithrombotics/antiplatelets: Secondary | ICD-10-CM | POA: Diagnosis not present

## 2020-01-17 DIAGNOSIS — L89159 Pressure ulcer of sacral region, unspecified stage: Secondary | ICD-10-CM

## 2020-01-17 DIAGNOSIS — J4489 Other specified chronic obstructive pulmonary disease: Secondary | ICD-10-CM | POA: Diagnosis present

## 2020-01-17 DIAGNOSIS — I5022 Chronic systolic (congestive) heart failure: Secondary | ICD-10-CM | POA: Diagnosis present

## 2020-01-17 DIAGNOSIS — E66813 Obesity, class 3: Secondary | ICD-10-CM

## 2020-01-17 DIAGNOSIS — Z833 Family history of diabetes mellitus: Secondary | ICD-10-CM | POA: Diagnosis not present

## 2020-01-17 DIAGNOSIS — Z7984 Long term (current) use of oral hypoglycemic drugs: Secondary | ICD-10-CM | POA: Diagnosis not present

## 2020-01-17 DIAGNOSIS — R531 Weakness: Secondary | ICD-10-CM

## 2020-01-17 DIAGNOSIS — E871 Hypo-osmolality and hyponatremia: Secondary | ICD-10-CM | POA: Diagnosis present

## 2020-01-17 DIAGNOSIS — G4733 Obstructive sleep apnea (adult) (pediatric): Secondary | ICD-10-CM | POA: Diagnosis present

## 2020-01-17 DIAGNOSIS — L89893 Pressure ulcer of other site, stage 3: Secondary | ICD-10-CM | POA: Diagnosis present

## 2020-01-17 DIAGNOSIS — Z85828 Personal history of other malignant neoplasm of skin: Secondary | ICD-10-CM | POA: Diagnosis not present

## 2020-01-17 DIAGNOSIS — N189 Chronic kidney disease, unspecified: Secondary | ICD-10-CM | POA: Diagnosis not present

## 2020-01-17 DIAGNOSIS — N184 Chronic kidney disease, stage 4 (severe): Secondary | ICD-10-CM

## 2020-01-17 DIAGNOSIS — Z8249 Family history of ischemic heart disease and other diseases of the circulatory system: Secondary | ICD-10-CM | POA: Diagnosis not present

## 2020-01-17 DIAGNOSIS — E1122 Type 2 diabetes mellitus with diabetic chronic kidney disease: Secondary | ICD-10-CM | POA: Diagnosis present

## 2020-01-17 DIAGNOSIS — L89152 Pressure ulcer of sacral region, stage 2: Secondary | ICD-10-CM | POA: Diagnosis present

## 2020-01-17 DIAGNOSIS — Z79899 Other long term (current) drug therapy: Secondary | ICD-10-CM | POA: Diagnosis not present

## 2020-01-17 DIAGNOSIS — D631 Anemia in chronic kidney disease: Secondary | ICD-10-CM

## 2020-01-17 DIAGNOSIS — I130 Hypertensive heart and chronic kidney disease with heart failure and stage 1 through stage 4 chronic kidney disease, or unspecified chronic kidney disease: Secondary | ICD-10-CM | POA: Diagnosis present

## 2020-01-17 DIAGNOSIS — Z794 Long term (current) use of insulin: Secondary | ICD-10-CM | POA: Diagnosis not present

## 2020-01-17 DIAGNOSIS — J9611 Chronic respiratory failure with hypoxia: Secondary | ICD-10-CM | POA: Diagnosis present

## 2020-01-17 DIAGNOSIS — Z888 Allergy status to other drugs, medicaments and biological substances status: Secondary | ICD-10-CM | POA: Diagnosis not present

## 2020-01-17 DIAGNOSIS — N179 Acute kidney failure, unspecified: Principal | ICD-10-CM

## 2020-01-17 DIAGNOSIS — Z881 Allergy status to other antibiotic agents status: Secondary | ICD-10-CM | POA: Diagnosis not present

## 2020-01-17 DIAGNOSIS — Z79890 Hormone replacement therapy (postmenopausal): Secondary | ICD-10-CM | POA: Diagnosis not present

## 2020-01-17 DIAGNOSIS — J449 Chronic obstructive pulmonary disease, unspecified: Secondary | ICD-10-CM | POA: Diagnosis present

## 2020-01-17 DIAGNOSIS — Z6841 Body Mass Index (BMI) 40.0 and over, adult: Secondary | ICD-10-CM | POA: Diagnosis not present

## 2020-01-17 LAB — GASTROINTESTINAL PANEL BY PCR, STOOL (REPLACES STOOL CULTURE)

## 2020-01-17 LAB — URINALYSIS, COMPLETE (UACMP) WITH MICROSCOPIC
Bilirubin Urine: NEGATIVE
Glucose, UA: NEGATIVE mg/dL
Ketones, ur: NEGATIVE mg/dL
Nitrite: NEGATIVE
Protein, ur: 100 mg/dL — AB
Specific Gravity, Urine: 1.014 (ref 1.005–1.030)
Squamous Epithelial / HPF: NONE SEEN (ref 0–5)
WBC, UA: >50 WBC/hpf — ABNORMAL HIGH (ref 0–5)
pH: 5 (ref 5.0–8.0)

## 2020-01-17 LAB — BASIC METABOLIC PANEL
Anion gap: 16 — ABNORMAL HIGH (ref 5–15)
BUN: 97 mg/dL — ABNORMAL HIGH (ref 8–23)
CO2: 20 mmol/L — ABNORMAL LOW (ref 22–32)
Calcium: 7.9 mg/dL — ABNORMAL LOW (ref 8.9–10.3)
Chloride: 88 mmol/L — ABNORMAL LOW (ref 98–111)
Creatinine, Ser: 4.72 mg/dL — ABNORMAL HIGH (ref 0.44–1.00)
GFR, Estimated: 10 mL/min — ABNORMAL LOW (ref >60)
Glucose, Bld: 214 mg/dL — ABNORMAL HIGH (ref 70–99)
Potassium: 5 mmol/L (ref 3.5–5.1)
Sodium: 124 mmol/L — ABNORMAL LOW (ref 135–145)

## 2020-01-17 LAB — GLUCOSE, CAPILLARY
Glucose-Capillary: 163 mg/dL — ABNORMAL HIGH (ref 70–99)
Glucose-Capillary: 200 mg/dL — ABNORMAL HIGH (ref 70–99)
Glucose-Capillary: 148 mg/dL — ABNORMAL HIGH (ref 70–99)

## 2020-01-17 LAB — URINALYSIS, ROUTINE W REFLEX MICROSCOPIC
Bilirubin Urine: NEGATIVE
Glucose, UA: NEGATIVE mg/dL
Ketones, ur: NEGATIVE mg/dL
Nitrite: NEGATIVE
Protein, ur: 100 mg/dL — AB
Specific Gravity, Urine: 1.013 (ref 1.005–1.030)
WBC, UA: >50 WBC/hpf — ABNORMAL HIGH (ref 0–5)
pH: 5 (ref 5.0–8.0)

## 2020-01-17 LAB — CBC
HCT: 28.6 % — ABNORMAL LOW (ref 36.0–46.0)
Hemoglobin: 9.1 g/dL — ABNORMAL LOW (ref 12.0–15.0)
MCH: 30 pg (ref 26.0–34.0)
MCHC: 31.8 g/dL (ref 30.0–36.0)
MCV: 94.4 fL (ref 80.0–100.0)
Platelets: 467 K/uL — ABNORMAL HIGH (ref 150–400)
RBC: 3.03 MIL/uL — ABNORMAL LOW (ref 3.87–5.11)
RDW: 17.1 % — ABNORMAL HIGH (ref 11.5–15.5)
WBC: 11.3 K/uL — ABNORMAL HIGH (ref 4.0–10.5)
nRBC: 0 % (ref 0.0–0.2)

## 2020-01-17 LAB — HEMOGLOBIN A1C
Hgb A1c MFr Bld: 5.7 % — ABNORMAL HIGH (ref 4.8–5.6)
Mean Plasma Glucose: 116.89 mg/dL

## 2020-01-17 LAB — SODIUM
Sodium: 127 mmol/L — ABNORMAL LOW (ref 135–145)
Sodium: 127 mmol/L — ABNORMAL LOW (ref 135–145)

## 2020-01-17 LAB — C DIFFICILE QUICK SCREEN W PCR REFLEX
C Diff antigen: NEGATIVE
C Diff interpretation: NOT DETECTED
C Diff toxin: NEGATIVE

## 2020-01-17 LAB — LACTIC ACID, PLASMA: Lactic Acid, Venous: 1.2 mmol/L (ref 0.5–1.9)

## 2020-01-17 MED ORDER — ENOXAPARIN SODIUM 30 MG/0.3ML ~~LOC~~ SOLN: 30.0000 mg | SUBCUTANEOUS | Status: DC

## 2020-01-17 MED ORDER — MORPHINE SULFATE (PF) 2 MG/ML IV SOLN
INTRAVENOUS | Status: AC
  Filled 2020-01-17: qty 1

## 2020-01-17 MED ORDER — ACETAMINOPHEN 650 MG RE SUPP: 650.0000 mg | Freq: Four times a day (QID) | RECTAL | Status: DC | PRN

## 2020-01-17 MED ORDER — GERHARDT'S BUTT CREAM
TOPICAL_CREAM | Freq: Two times a day (BID) | CUTANEOUS | Status: DC
  Filled 2020-01-17 (×2): qty 1

## 2020-01-17 MED ORDER — SODIUM CHLORIDE 0.9 % IV BOLUS
1000.0000 mL | Freq: Once | INTRAVENOUS | Status: AC
  Administered 2020-01-17: 1000 mL via INTRAVENOUS

## 2020-01-17 MED ORDER — INSULIN ASPART 100 UNIT/ML ~~LOC~~ SOLN: 0.0000 [IU] | Freq: Every day | SUBCUTANEOUS | Status: DC

## 2020-01-17 MED ORDER — ONDANSETRON HCL 4 MG PO TABS: 4.0000 mg | ORAL_TABLET | Freq: Four times a day (QID) | ORAL | Status: DC | PRN

## 2020-01-17 MED ORDER — CHLORHEXIDINE GLUCONATE CLOTH 2 % EX PADS
6.0000 | MEDICATED_PAD | Freq: Every day | CUTANEOUS | Status: DC
  Administered 2020-01-18 – 2020-01-19 (×2): 6 via TOPICAL

## 2020-01-17 MED ORDER — ACETAMINOPHEN 325 MG PO TABS
650.0000 mg | ORAL_TABLET | Freq: Four times a day (QID) | ORAL | Status: DC | PRN
  Administered 2020-01-17: 650 mg via ORAL
  Filled 2020-01-17: qty 2

## 2020-01-17 MED ORDER — PROMETHAZINE HCL 25 MG PO TABS
25.0000 mg | ORAL_TABLET | Freq: Once | ORAL | Status: AC
  Administered 2020-01-17: 25 mg via ORAL
  Filled 2020-01-17: qty 1

## 2020-01-17 MED ORDER — PERPHENAZINE 2 MG PO TABS
1.0000 mg | ORAL_TABLET | Freq: Two times a day (BID) | ORAL | Status: DC
  Administered 2020-01-18 – 2020-01-20 (×5): 1 mg via ORAL
  Filled 2020-01-17 (×8): qty 1

## 2020-01-17 MED ORDER — ONDANSETRON HCL 4 MG/2ML IJ SOLN
4.0000 mg | Freq: Four times a day (QID) | INTRAMUSCULAR | Status: DC | PRN
  Filled 2020-01-17: qty 2

## 2020-01-17 MED ORDER — IOHEXOL 9 MG/ML PO SOLN
500.0000 mL | ORAL | Status: AC
  Administered 2020-01-17 (×2): 500 mL via ORAL

## 2020-01-17 MED ORDER — INSULIN ASPART 100 UNIT/ML ~~LOC~~ SOLN
0.0000 [IU] | Freq: Three times a day (TID) | SUBCUTANEOUS | Status: DC
  Administered 2020-01-17 – 2020-01-19 (×5): 4 [IU] via SUBCUTANEOUS
  Administered 2020-01-19: 08:00:00 3 [IU] via SUBCUTANEOUS
  Administered 2020-01-20: 13:00:00 11 [IU] via SUBCUTANEOUS
  Administered 2020-01-20: 10:00:00 3 [IU] via SUBCUTANEOUS
  Filled 2020-01-17 (×9): qty 1

## 2020-01-17 MED ORDER — HYDROCODONE-ACETAMINOPHEN 5-325 MG PO TABS
1.0000 | ORAL_TABLET | Freq: Once | ORAL | Status: AC
  Administered 2020-01-17: 1 via ORAL
  Filled 2020-01-17: qty 1

## 2020-01-17 MED ORDER — COLLAGENASE 250 UNIT/GM EX OINT
TOPICAL_OINTMENT | Freq: Every day | CUTANEOUS | Status: DC
  Filled 2020-01-17: qty 30

## 2020-01-17 MED ORDER — SODIUM CHLORIDE 0.9 % IV SOLN: INTRAVENOUS | Status: DC

## 2020-01-17 NOTE — Progress Notes (Signed)
PROGRESS NOTE    Gabrielle [name]  IZT:[id number] DOB: 10-07-1950 DOA: 01/17/2020 PCP: System, Provider Not In   Brief Narrative:  HPI: Gabrielle [name] is a 69 y.o. female with medical history significant for COPD, chronic respiratory failure on home O2 at 2 L, OSA, systolic heart failure, last EF 30 to 35%, CKD4, type 2 diabetes, HTN, morbid obesity, bedbound at baseline, who was sent from the nursing home for evaluation of abnormal labs.  Patient had been having diarrhea and generalized malaise and blood work was concerning for worsening renal function.  Patient had recently been on Keflex to treat cellulitis of buttocks.  She has had no fever or chills.  Has had no cough or shortness of breath beyond baseline ED Course: On arrival in the ED, BP 114/63, pulse 107, temp 98.2, O2 sat 100% on O2 at home flow rate.  Blood work significant for creatinine of 4.59, up from baseline around 2.15 in the past 6 months, with normal potassium and bicarb, sodium 126 hemoglobin 9.4, around her baseline for CKD.  WBC 11,000.  Covid and flu test negative.  Stool tests pending EKG as reviewed by me : Sinus tachycardia at 105 with no acute ST-T wave changes Imaging: Chest x-ray clear Patient was started on IV hydration.  Hospitalist consulted for admission.   Assessment & Plan:   Principal Problem:   Acute kidney injury superimposed on CKD ([place]) Active Problems:   AKI (acute kidney injury) ([place])   COPD with chronic bronchitis (HCC)   OSA (obstructive sleep apnea)   Obesity, Class III, BMI 40-49.9 (morbid obesity) (HCC)   Hyponatremia   Anemia of chronic kidney failure, stage 4 (severe) (HCC)   Diarrhea   Sacral decubitus ulcer     Acute kidney injury superimposed on CKD stage IV: -Creatinine 4.59, up from baseline of 2.15 (her baseline) -Likely prerenal related to recent diarrheal illness Creatinine jumped slightly more.  We will continue IV hydration and repeat labs in the morning.     Diarrhea: -Patient with history of recent antibiotic use for cellulitis of buttocks GI pathogen panel and C. difficile negative.  Diarrhea has improved.  If needed, we can consider antimotility agents.  Hypovolemic hyponatremia -Sodium of 126 upon presentation, likely secondary to dehydration.  Now down to 124.  Continue normal saline and check sodium frequently.    COPD with chronic bronchitis ([place]): Stable.  At baseline.  Continue home medications.    OSA (obstructive sleep apnea): Noted.  Continue nightly CPAP.  Chronic respiratory failure with hypoxia: At baseline. -Chronic and stable -Continue home inhalers with duo nebs as needed -Supplemental oxygen to continue -Patient intolerant of CPAP    Obesity, Class III, BMI 40-49.9 (morbid obesity) (HCC) Bedbound -This complicates overall prognosis and care -Increase nursing care -Decubitus precautions    Anemia of chronic kidney failure, stage 4 (severe) (HCC) -Hemoglobin at baseline, slightly up from baseline likely secondary to hemoconcentration  Dermatitis buttocks -Suspect contact dermatitis/candidal yeast -Barrier precautions, zinc oxide cream -Avoid antibiotics for right now.  Patient was recently on Keflex -Wound care consult  DVT prophylaxis:    Code Status: Full Code  Family Communication: None present at bedside.  Plan of care discussed with patient in length and he verbalized understanding and agreed with it.  Status is: Inpatient  Remains inpatient appropriate because:Inpatient level of care appropriate due to severity of illness   Dispo: The patient is from: SNF  Anticipated d/c is to: SNF              Anticipated d/c date is: 1 day              Patient currently is not medically stable to d/c.        Estimated body mass index is 43.8 kg/m as calculated from the following:   Height as of this encounter: 5\' 6"  (1.676 m).   Weight as of this encounter: 123.1 kg.      Nutritional  status:               Consultants:   None  Procedures:   None  Antimicrobials:  Anti-infectives (From admission, onward)   None         Subjective: Patient seen and examined.  She feels better than yesterday.  Diarrhea has subsided.  She has no other complaint.  Objective: Vitals:   01/17/20 0230 01/17/20 0330 01/17/20 0857 01/17/20 0953  BP: 115/66 118/70 116/70 105/68  Pulse: (!) 104 (!) 102 (!) 107 95  Resp: 20 14 14 18   Temp:   98.1 F (36.7 C) 98.2 F (36.8 C)  TempSrc:   Oral   SpO2: 100% 100% 100% 100%  Weight:      Height:       No intake or output data in the 24 hours ending 01/17/20 1054 Filed Weights   01/16/20 2206  Weight: 123.1 kg    Examination:  General exam: Appears calm and comfortable, morbidly obese Respiratory system: Diminished breath sounds. Respiratory effort normal. Cardiovascular system: S1 & S2 heard, RRR. No JVD, murmurs, rubs, gallops or clicks. No pedal edema. Gastrointestinal system: Abdomen is nondistended, soft and nontender. No organomegaly or masses felt. Normal bowel sounds heard. Central nervous system: Alert and oriented.  Generalized weakness with decreased strength in all 4 extremities, more in the lower extremities.  This is chronic. Extremities: Symmetric 5 x 5 power. Skin: Ulcer at the lateral side of the left lower extremity.  Dressing in place.  Direct visualization deferred to wound care. Psychiatry: Judgement and insight appear normal. Mood & affect appropriate.    Data Reviewed: I have personally reviewed following labs and imaging studies  CBC: Recent Labs  Lab 01/16/20 2233 01/17/20 0432  WBC 11.0* 11.3*  NEUTROABS 7.4  --   HGB 9.4* 9.1*  HCT 28.2* 28.6*  MCV 92.2 94.4  PLT 479* 154*   Basic Metabolic Panel: Recent Labs  Lab 01/16/20 2233 01/17/20 0432  NA 126* 124*  K 5.1 5.0  CL 89* 88*  CO2 22 20*  GLUCOSE 194* 214*  BUN 95* 97*  CREATININE 4.59* 4.72*  CALCIUM 8.1* 7.9*    GFR: Estimated Creatinine Clearance: 15.5 mL/min (A) (by C-G formula based on SCr of 4.72 mg/dL (H)). Liver Function Tests: Recent Labs  Lab 01/16/20 2233  AST 15  ALT 11  ALKPHOS 114  BILITOT 1.0  PROT 7.0  ALBUMIN 1.6*   No results for input(s): LIPASE, AMYLASE in the last 168 hours. No results for input(s): AMMONIA in the last 168 hours. Coagulation Profile: No results for input(s): INR, PROTIME in the last 168 hours. Cardiac Enzymes: No results for input(s): CKTOTAL, CKMB, CKMBINDEX, TROPONINI in the last 168 hours. BNP (last 3 results) No results for input(s): PROBNP in the last 8760 hours. HbA1C: Recent Labs    01/17/20 0432  HGBA1C 5.7*   CBG: No results for input(s): GLUCAP in the last 168 hours. Lipid Profile: No  results for input(s): CHOL, HDL, LDLCALC, TRIG, CHOLHDL, LDLDIRECT in the last 72 hours. Thyroid Function Tests: No results for input(s): TSH, T4TOTAL, FREET4, T3FREE, THYROIDAB in the last 72 hours. Anemia Panel: No results for input(s): VITAMINB12, FOLATE, FERRITIN, TIBC, IRON, RETICCTPCT in the last 72 hours. Sepsis Labs: Recent Labs  Lab 01/17/20 0030  LATICACIDVEN 1.2    Recent Results (from the past 240 hour(s))  Resp Panel by RT-PCR (Flu A&B, Covid) Nasopharyngeal Swab     Status: None   Collection Time: 01/16/20 10:33 PM   Specimen: Nasopharyngeal Swab; Nasopharyngeal(NP) swabs in vial transport medium  Result Value Ref Range Status   SARS Coronavirus 2 by RT PCR NEGATIVE NEGATIVE Final    Comment: (NOTE) SARS-CoV-2 target nucleic acids are NOT DETECTED.  The SARS-CoV-2 RNA is generally detectable in upper respiratory specimens during the acute phase of infection. The lowest concentration of SARS-CoV-2 viral copies this assay can detect is 138 copies/mL. A negative result does not preclude SARS-Cov-2 infection and should not be used as the sole basis for treatment or other patient management decisions. A negative result may occur  with  improper specimen collection/handling, submission of specimen other than nasopharyngeal swab, presence of viral mutation(s) within the areas targeted by this assay, and inadequate number of viral copies(<138 copies/mL). A negative result must be combined with clinical observations, patient history, and epidemiological information. The expected result is Negative.  Fact Sheet for Patients:  EntrepreneurPulse.com.au  Fact Sheet for Healthcare Providers:  IncredibleEmployment.be  This test is no t yet approved or cleared by the [place] FDA and  has been authorized for detection and/or diagnosis of SARS-CoV-2 by FDA under an Emergency Use Authorization (EUA). This EUA will remain  in effect (meaning this test can be used) for the duration of the COVID-19 declaration under Section 564(b)(1) of the Act, 21 U.S.C.section 360bbb-3(b)(1), unless the authorization is terminated  or revoked sooner.       Influenza A by PCR NEGATIVE NEGATIVE Final   Influenza B by PCR NEGATIVE NEGATIVE Final    Comment: (NOTE) The Xpert Xpress SARS-CoV-2/FLU/RSV plus assay is intended as an aid in the diagnosis of influenza from Nasopharyngeal swab specimens and should not be used as a sole basis for treatment. Nasal washings and aspirates are unacceptable for Xpert Xpress SARS-CoV-2/FLU/RSV testing.  Fact Sheet for Patients: EntrepreneurPulse.com.au  Fact Sheet for Healthcare Providers: IncredibleEmployment.be  This test is not yet approved or cleared by the [place] FDA and has been authorized for detection and/or diagnosis of SARS-CoV-2 by FDA under an Emergency Use Authorization (EUA). This EUA will remain in effect (meaning this test can be used) for the duration of the COVID-19 declaration under Section 564(b)(1) of the Act, 21 U.S.C. section 360bbb-3(b)(1), unless the authorization is terminated  or revoked.  Performed at [facility], [address] [place], [place] [address]   Culture, blood (routine x 2)     Status: None (Preliminary result)   Collection Time: 01/17/20 12:30 AM   Specimen: BLOOD  Result Value Ref Range Status   Specimen Description BLOOD RIGHT ANTECUBITAL  Final   Special Requests   Final    BOTTLES DRAWN AEROBIC AND ANAEROBIC Blood Culture adequate volume   Culture   Final    NO GROWTH < 12 HOURS Performed at [facility], [address] [place], [place] [address]    Report Status PENDING  Incomplete  Culture, blood (routine x 2)     Status: None (Preliminary  result)   Collection Time: 01/17/20 12:30 AM   Specimen: BLOOD  Result Value Ref Range Status   Specimen Description   Final    BLOOD Blood Culture results may not be optimal due to an excessive volume of blood received in culture bottles   Special Requests   Final    BOTTLES DRAWN AEROBIC AND ANAEROBIC LEFT ANTECUBITAL   Culture   Final    NO GROWTH < 12 HOURS Performed at [facility], [address] [place], [place] [address]    Report Status PENDING  Incomplete  C Difficile Quick Screen w PCR reflex     Status: None   Collection Time: 01/17/20 12:30 AM   Specimen: STOOL  Result Value Ref Range Status   C Diff antigen NEGATIVE NEGATIVE Final   C Diff toxin NEGATIVE NEGATIVE Final   C Diff interpretation No C. difficile detected.  Final    Comment: Performed at [facility], [address] [place], [place] [address]  Gastrointestinal Panel by PCR , Stool     Status: None   Collection Time: 01/17/20 12:30 AM   Specimen: Stool  Result Value Ref Range Status   Campylobacter species NOT DETECTED NOT DETECTED Final   Plesimonas shigelloides NOT DETECTED NOT DETECTED Final   Salmonella species NOT DETECTED NOT DETECTED Final   Yersinia enterocolitica NOT DETECTED NOT DETECTED Final   Vibrio species NOT DETECTED NOT DETECTED Final   Vibrio  cholerae NOT DETECTED NOT DETECTED Final   Enteroaggregative E coli (EAEC) NOT DETECTED NOT DETECTED Final   Enteropathogenic E coli (EPEC) NOT DETECTED NOT DETECTED Final   Enterotoxigenic E coli (ETEC) NOT DETECTED NOT DETECTED Final   Shiga like toxin producing E coli (STEC) NOT DETECTED NOT DETECTED Final   Shigella/Enteroinvasive E coli (EIEC) NOT DETECTED NOT DETECTED Final   Cryptosporidium NOT DETECTED NOT DETECTED Final   Cyclospora cayetanensis NOT DETECTED NOT DETECTED Final   Entamoeba histolytica NOT DETECTED NOT DETECTED Final   Giardia lamblia NOT DETECTED NOT DETECTED Final   Adenovirus F40/41 NOT DETECTED NOT DETECTED Final   Astrovirus NOT DETECTED NOT DETECTED Final   Norovirus GI/GII NOT DETECTED NOT DETECTED Final   Rotavirus A NOT DETECTED NOT DETECTED Final   Sapovirus (I, II, IV, and V) NOT DETECTED NOT DETECTED Final    Comment: Performed at [facility], [address], [place], [place] [address]      Radiology Studies: DG Chest Port 1 View  Result Date: 01/17/2020 CLINICAL DATA:  Weakness EXAM: PORTABLE CHEST 1 VIEW COMPARISON:  None. FINDINGS: The heart size and mediastinal contours are within normal limits. Both lungs are clear. The visualized skeletal structures are unremarkable. IMPRESSION: No active disease. Electronically Signed   By: Prudencio Pair M.D.   On: 01/17/2020 00:42    Scheduled Meds: . insulin aspart  0-20 Units Subcutaneous TID WC  . insulin aspart  0-5 Units Subcutaneous QHS  . morphine       Continuous Infusions: . sodium chloride 100 mL/hr at 01/17/20 0626     LOS: 0 days   Time spent: 35 minutes   Darliss Cheney, MD [facility]  01/17/2020, 10:54 AM   To contact the attending provider between 7A-7P or the covering provider during after hours 7P-7A, please log into the web site [email or web address].

## 2020-01-17 NOTE — ED Provider Notes (Signed)
[facility] Emergency Department Provider Note   ____________________________________________   Event Date/Time   First MD Initiated Contact with Patient 01/17/20 [phone]     (approximate)  I have reviewed the triage vital signs and the nursing notes.   HISTORY  Chief Complaint Abnormal Lab    HPI Gabrielle [name] is a 69 y.o. female brought to the ED via EMS from SNF with a chief complaint of abnormal labs.  Patient with a history of morbid obesity, CKD, diabetes, hypertension, oxygen dependency who states she has been feeling unwell for the past week.  Blood work was checked this morning which showed worsening renal function.  Patient complains of generalized weakness, pain due to a sore on her buttocks, nausea and diarrhea.  States she was placed on Keflex last week for the sore on her buttocks.  Denies fever, chest pain, shortness of breath, abdominal pain, vomiting. Patient is vaccinated plus booster against COVID-19.     Past Medical History:  Diagnosis Date  . Allergy    seasonal allergies  . Anemia    anemia of chronic kidney disease  . Arthritis   . Cellulitis and abscess of left leg   . Chronic kidney disease    she reports grade 2 kidney disease  . Chronic venous stasis dermatitis of both lower extremities   . Diabetes mellitus without complication ([place])   . Hypertension   . Morbid obesity ([place])   . Osteoarthritis   . Oxygen dependent   . Skin cancer   . Sleep apnea     Patient Active Problem List   Diagnosis Date Noted  . Hyponatremia 01/17/2020  . Anemia of chronic kidney failure, stage 4 (severe) ([place]) 01/17/2020  . Diarrhea 01/17/2020  . Sacral decubitus ulcer 01/17/2020  . Iron deficiency anemia 10/21/2019  . 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 OSA (obstructive sleep apnea) 08/14/2019  . Obesity, Class III, BMI 40-49.9 (morbid obesity) ([place]) 08/14/2019  . UTI (urinary tract infection) 08/14/2019  . Aphasia  08/13/2019  . 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 Cellulitis 04/29/2016  . Acute kidney injury superimposed on CKD ([place]) 10/02/2015  . Diabetes ([place]) 10/01/2015  . SOB (shortness of breath) 10/01/2015  . AKI (acute kidney injury) ([place]) 10/01/2015  . Hyperkalemia 07/21/2015  . Uncontrolled hypertension 07/21/2015    Past Surgical History:  Procedure Laterality Date  . NO PAST SURGERIES    . none      Prior to Admission medications   Medication Sig Start Date End Date Taking? Authorizing Provider  acetaminophen (TYLENOL) 500 MG tablet Take 500 mg by mouth 3 (three) times daily.    [provider]  benztropine (COGENTIN) 1 MG tablet Take 1 mg by mouth 2 (two) times daily.  04/26/17   [provider]  bisacodyl (DULCOLAX) 5 MG EC tablet Take 10 mg by mouth daily as needed for moderate constipation or severe constipation.    [provider]  busPIRone (BUSPAR) 10 MG tablet Take 10 mg by mouth 3 (three) times daily.     [provider]  Cholecalciferol (VITAMIN D3) 1000 units CAPS Take 1,000 Units by mouth daily.     [provider]  clobetasol cream (TEMOVATE) 0.99 % Apply 1 application topically in the morning and at bedtime. (apply to scalp, legs and arms)    [provider]  clopidogrel (PLAVIX) 75 MG tablet Take 1 tablet (75 mg total) by mouth daily. 08/15/19 08/14/20  Max Sane,  MD  docusate sodium (COLACE) 100 MG capsule Take 100 mg by mouth daily.     [provider]  docusate sodium (COLACE) 100 MG capsule Take 100 mg by mouth every 8 (eight) hours as needed for mild constipation or moderate constipation.    [provider]  empagliflozin (JARDIANCE) 25 MG TABS tablet Take 1 tablet (25 mg total) by mouth daily before breakfast. 08/15/19   Max Sane, MD  ethacrynic acid (EDECRIN) 25 MG tablet Take 3 tablets (75 mg total) by mouth 2 (two) times daily. 05/04/16   Vaughan Basta, MD  Exenatide ER  (BYDUREON BCISE) 2 MG/0.85ML AUIJ Inject 2 mg into the skin every Wednesday.    [provider]  fluticasone (FLONASE) 50 MCG/ACT nasal spray Place 1 spray into both nostrils every 12 (twelve) hours as needed for allergies.     [provider]  HUMALOG 100 UNIT/ML cartridge Inject 5 Units into the skin See admin instructions. Inject 5u under the skin three times daily with meals with sliding scale    [provider]  hydrALAZINE (APRESOLINE) 25 MG tablet Take 25 mg by mouth 4 (four) times daily.     [provider]  HYDROcodone-acetaminophen (NORCO/VICODIN) 5-325 MG tablet Take 1 tablet by mouth every 6 (six) hours as needed for up to 4 doses for moderate pain or severe pain. 08/15/19   Max Sane, MD  insulin glargine (LANTUS) 100 UNIT/ML injection Inject 0.85 mLs (85 Units total) into the skin daily. Patient taking differently: Inject 43 Units into the skin 2 (two) times daily.  05/04/16   Vaughan Basta, MD  ipratropium-albuterol (DUONEB) 0.5-2.5 (3) MG/3ML SOLN Take 3 mLs by nebulization every 4 (four) hours as needed (congestion).    [provider]  Melatonin 1 MG TABS Take 3 mg by mouth at bedtime.     [provider]  nabumetone (RELAFEN) 500 MG tablet Take 500 mg by mouth daily.     [provider]  nitroGLYCERIN (NITROSTAT) 0.4 MG SL tablet Place 0.4 mg under the tongue every 5 (five) minutes as needed for chest pain.    [provider]  nystatin (MYCOSTATIN/NYSTOP) powder Apply 1 application topically 2 (two) times daily. (apply to perennial area)    [provider]  nystatin cream (MYCOSTATIN) Apply 1 application topically 2 (two) times daily. (apply to perennial area)    [provider]  omeprazole (PRILOSEC) 20 MG capsule Take 20 mg by mouth every other day.    [provider]  perphenazine (TRILAFON) 2 MG tablet Take 1 mg by mouth 2 (two) times daily.     [provider]   polyethylene glycol (MIRALAX / GLYCOLAX) packet Take 17 g by mouth daily.    [provider]  sodium chloride (OCEAN) 0.65 % SOLN nasal spray Place 2 sprays into both nostrils every 8 (eight) hours as needed for congestion.    [provider]  vitamin B-12 (CYANOCOBALAMIN) 1000 MCG tablet Take 1,000 mcg by mouth every Monday, Wednesday, and Friday.    [provider]    Allergies Lasix [furosemide], Ultram [tramadol], Erythromycin, Plasticized base [plastibase], and Tetracyclines & related  Family History  Problem Relation Age of Onset  . Diabetes Mellitus II Father   . CAD Father   . CAD Mother   . Hiatal hernia Mother     Social History Social History   Tobacco Use  . Smoking status: Never Smoker  . Smokeless tobacco: Never Used  Vaping Use  .  Vaping Use: Never used  Substance Use Topics  . Alcohol use: No  . Drug use: No    Review of Systems  Constitutional: Positive for generalized weakness.  No fever/chills Eyes: No visual changes. ENT: No sore throat. Cardiovascular: Denies chest pain. Respiratory: Denies shortness of breath. Gastrointestinal: No abdominal pain.  Positive for nausea, no vomiting.  Positive for diarrhea.  No constipation. Genitourinary: Negative for dysuria. Musculoskeletal: Negative for back pain. Skin: Positive for sacral decubitus.  Negative for rash. Neurological: Negative for headaches, focal weakness or numbness.   ____________________________________________   PHYSICAL EXAM:  VITAL SIGNS: ED Triage Vitals [01/16/20 2206]  Enc Vitals Group     BP 114/63     Pulse Rate (!) 107     Resp 18     Temp 98.2 F (36.8 C)     Temp Source Oral     SpO2 100 %     Weight 271 lb 6.2 oz (123.1 kg)     Height 5\' 6"  (1.676 m)     Head Circumference      Peak Flow      Pain Score 7     Pain Loc      Pain Edu?      Excl. in [place]?     Constitutional: Alert and oriented.  Chronically ill appearing and in mild acute  distress. Eyes: Conjunctivae are normal. PERRL. EOMI. Head: Atraumatic. Nose: No congestion/rhinnorhea. Mouth/Throat: Mucous membranes are dry.   Neck: No stridor.   Cardiovascular: Normal rate, regular rhythm. Grossly normal heart sounds.  Good peripheral circulation. Respiratory: Normal respiratory effort.  No retractions. Lungs CTAB. Gastrointestinal: Morbidly obese.  Soft and nontender to light or deep palpation. No distention. No abdominal bruits. No CVA tenderness. Musculoskeletal: No lower extremity tenderness nor edema.  No joint effusions. Neurologic:  Normal speech and language. No gross focal neurologic deficits are appreciated.  Skin:  Skin is warm, dry and intact. No rash noted. Sacral decubiti as noted:    Psychiatric: Mood and affect are normal. Speech and behavior are normal.  ____________________________________________   LABS (all labs ordered are listed, but only abnormal results are displayed)  Labs Reviewed  CBC WITH DIFFERENTIAL/PLATELET - Abnormal; Notable for the following components:      Result Value   WBC 11.0 (*)    RBC 3.06 (*)    Hemoglobin 9.4 (*)    HCT 28.2 (*)    RDW 17.3 (*)    Platelets 479 (*)    Monocytes Absolute 1.1 (*)    Abs Immature Granulocytes 0.10 (*)    All other components within normal limits  BRAIN NATRIURETIC PEPTIDE - Abnormal; Notable for the following components:   B Natriuretic Peptide 219.3 (*)    All other components within normal limits  COMPREHENSIVE METABOLIC PANEL - Abnormal; Notable for the following components:   Sodium 126 (*)    Chloride 89 (*)    Glucose, Bld 194 (*)    BUN 95 (*)    Creatinine, Ser 4.59 (*)    Calcium 8.1 (*)    Albumin 1.6 (*)    GFR, Estimated 10 (*)    All other components within normal limits  URINALYSIS, COMPLETE (UACMP) WITH MICROSCOPIC - Abnormal; Notable for the following components:   Color, Urine YELLOW (*)    APPearance TURBID (*)    Hgb urine dipstick SMALL (*)     Protein, ur 100 (*)    Leukocytes,Ua SMALL (*)    WBC, UA >50 (*)  Bacteria, UA MANY (*)    All other components within normal limits  BASIC METABOLIC PANEL - Abnormal; Notable for the following components:   Sodium 124 (*)    Chloride 88 (*)    CO2 20 (*)    Glucose, Bld 214 (*)    BUN 97 (*)    Creatinine, Ser 4.72 (*)    Calcium 7.9 (*)    GFR, Estimated 10 (*)    Anion gap 16 (*)    All other components within normal limits  CBC - Abnormal; Notable for the following components:   WBC 11.3 (*)    RBC 3.03 (*)    Hemoglobin 9.1 (*)    HCT 28.6 (*)    RDW 17.1 (*)    Platelets 467 (*)    All other components within normal limits  RESP PANEL BY RT-PCR (FLU A&B, COVID) [id number]  GASTROINTESTINAL PANEL BY PCR, STOOL (REPLACES STOOL CULTURE)  CULTURE, BLOOD (ROUTINE X 2)  CULTURE, BLOOD (ROUTINE X 2)  C DIFFICILE QUICK SCREEN W PCR REFLEX  C DIFFICILE QUICK SCREEN W PCR REFLEX  LACTIC ACID, PLASMA  HEMOGLOBIN A1C  TROPONIN I (HIGH SENSITIVITY)   ____________________________________________  EKG  ED ECG REPORT I, Marion Rosenberry J, the attending physician, personally viewed and interpreted this ECG.   Date: 01/17/2020  EKG Time: 2233  Rate: 105  Rhythm: sinus tachycardia  Axis: Normal  Intervals:none  ST&T Change: Nonspecific  ____________________________________________  RADIOLOGY I, Alexandrina Fiorini J, personally viewed and evaluated these images (plain radiographs) as part of my medical decision making, as well as reviewing the written report by the radiologist.  ED MD interpretation: No acute cardiopulmonary process  Official radiology report(s): DG Chest Port 1 View  Result Date: 01/17/2020 CLINICAL DATA:  Weakness EXAM: PORTABLE CHEST 1 VIEW COMPARISON:  None. FINDINGS: The heart size and mediastinal contours are within normal limits. Both lungs are clear. The visualized skeletal structures are unremarkable. IMPRESSION: No active disease. Electronically Signed    By: Prudencio Pair M.D.   On: 01/17/2020 00:42    ____________________________________________   PROCEDURES  Procedure(s) performed (including Critical Care):  .1-3 Lead EKG Interpretation Performed by: Paulette Blanch, MD Authorized by: Paulette Blanch, MD     Interpretation: normal     ECG rate:  99   ECG rate assessment: normal     Rhythm: sinus rhythm     Ectopy: none     Conduction: normal   Comments:     Patient placed on cardiac monitor to evaluate for arrhythmias     ____________________________________________   INITIAL IMPRESSION / ASSESSMENT AND PLAN / ED COURSE  As part of my medical decision making, I reviewed the following data within the [facility] notes reviewed and incorporated, Labs reviewed, EKG interpreted, Old chart reviewed (AKI visits at [facility]), Radiograph reviewed, Discussed with admitting physician and Notes from prior ED visits     69 year old female sent for worsening renal function.  Differential diagnosis includes but is not limited to dehydration, infectious, metabolic etiologies, etc.  Lab work significant for hyponatremia which is new from prior, acute on chronic renal failure.  Liquid stool found on patient on examination.  Since she has recently been on antibiotics, will collect for C. difficile and bio fire.  Check blood cultures, lactic acid.  Initiate IV fluid resuscitation.  Will discuss with hospital services for admission.      ____________________________________________   FINAL CLINICAL IMPRESSION(S) / ED DIAGNOSES  Final diagnoses:  Weakness generalized  Hyponatremia  AKI (acute kidney injury) ([place])  Chronic kidney disease, unspecified CKD stage  Sacral decubitus ulcer, stage II [facility])     ED Discharge Orders    None      *Please note:  Nickie Warwick was evaluated in Emergency Department on 01/17/2020 for the symptoms described in the history of present illness. She was evaluated in the context of the  global COVID-19 pandemic, which necessitated consideration that the patient might be at risk for infection with the SARS-CoV-2 virus that causes COVID-19. Institutional protocols and algorithms that pertain to the evaluation of patients at risk for COVID-19 are in a state of rapid change based on information released by regulatory bodies including the CDC and federal and state organizations. These policies and algorithms were followed during the patient's care in the ED.  Some ED evaluations and interventions may be delayed as a result of limited staffing during and the pandemic.*   Note:  This document was prepared using Dragon voice recognition software and may include unintentional dictation errors.   Paulette Blanch, MD 01/17/20 [phone]

## 2020-01-17 NOTE — ED Notes (Signed)
Pt repositioned in bed at this time, purwick in placed, pt provided with a blanket

## 2020-01-17 NOTE — Consult Note (Signed)
[facility] Nurse Consult Note: Reason for Consult:Acute onset diarrhea and moisture associated skin damage, complicated by pressure and bedbound status. Wound type: full thickness tissue loss to bilateral ischial tuberosities and ischium.   Pressure Injury POA: Yes pressure and moisture.  Measurement: 3 cm x 3 cm x 0.2 cm lesions to bilateral ischium Erythema to buttocks and sacrum.  2 cm x 3 cm x 0.3 cm lesion to left lower leg. Chronic nonhealing Wound bed:red and moist Drainage (amount, consistency, odor) scant weeping.  Periwound:erythema and moisture.  Dressing procedure/placement/frequency:NO DISPOSABLE BRIEFS OR UNDERPADS WHILE IN BED>  Low air loss mattress replacement.  Gerhardts butt paste to sacrum and buttocks.  Cleanse left lower leg with NS and pat dry. Apply santyl to open wound.  Cover with NS moist gauze. Secure with dry gauze and kerlix/tape.  Change daily.  Will not follow at this time.  Please re-consult if needed.  Domenic Moras MSN, RN, FNP-BC CWON Wound, Ostomy, Continence Nurse Pager [phone]

## 2020-01-17 NOTE — H&P (Signed)
History and Physical    Gabrielle [name] PIR:[id number] DOB: October 29, 1950 DOA: 01/17/2020  PCP: System, Provider Not In   Patient coming from: SNF  I have personally briefly reviewed patient's old medical records in [place]  Chief Complaint: Abnormal labs  HPI: Gabrielle [name] is a 69 y.o. female with medical history significant for COPD, chronic respiratory failure on home O2 at 2 L, OSA, systolic heart failure, last EF 30 to 35%, CKD4, type 2 diabetes, HTN, morbid obesity, bedbound at baseline, who was sent from the nursing home for evaluation of abnormal labs.  Patient had been having diarrhea and generalized malaise and blood work was concerning for worsening renal function.  Patient had recently been on Keflex to treat cellulitis of buttocks.  She has had no fever or chills.  Has had no cough or shortness of breath beyond baseline ED Course: On arrival in the ED, BP 114/63, pulse 107, temp 98.2, O2 sat 100% on O2 at home flow rate.  Blood work significant for creatinine of 4.59, up from baseline around 2.15 in the past 6 months, with normal potassium and bicarb, sodium 126 hemoglobin 9.4, around her baseline for CKD.  WBC 11,000.  Covid and flu test negative.  Stool tests pending EKG as reviewed by me : Sinus tachycardia at 105 with no acute ST-T wave changes Imaging: Chest x-ray clear Patient was started on IV hydration.  Hospitalist consulted for admission.   Review of Systems: As per HPI otherwise all other systems on review of systems negative.    Past Medical History:  Diagnosis Date  . Allergy    seasonal allergies  . Anemia    anemia of chronic kidney disease  . Arthritis   . Cellulitis and abscess of left leg   . Chronic kidney disease    she reports grade 2 kidney disease  . Chronic venous stasis dermatitis of both lower extremities   . Diabetes mellitus without complication ([place])   . Hypertension   . Morbid obesity ([place])   . Osteoarthritis   . Oxygen dependent    . Skin cancer   . Sleep apnea     Past Surgical History:  Procedure Laterality Date  . NO PAST SURGERIES    . none       reports that she has never smoked. She has never used smokeless tobacco. She reports that she does not drink alcohol and does not use drugs.  Allergies  Allergen Reactions  . Lasix [Furosemide]     Numbness and tingling of the face   . Ultram [Tramadol] Other (See Comments)    Pt reports, "makes my head feel funny."  . Erythromycin Rash  . Plasticized Base [Plastibase] Rash  . Tetracyclines & Related Rash    Family History  Problem Relation Age of Onset  . Diabetes Mellitus II Father   . CAD Father   . CAD Mother   . Hiatal hernia Mother       Prior to Admission medications   Medication Sig Start Date End Date Taking? Authorizing Provider  acetaminophen (TYLENOL) 500 MG tablet Take 500 mg by mouth 3 (three) times daily.    [provider]  benztropine (COGENTIN) 1 MG tablet Take 1 mg by mouth 2 (two) times daily.  04/26/17   [provider]  bisacodyl (DULCOLAX) 5 MG EC tablet Take 10 mg by mouth daily as needed for moderate constipation or severe constipation.    [provider]  busPIRone (BUSPAR) 10 MG  tablet Take 10 mg by mouth 3 (three) times daily.     [provider]  Cholecalciferol (VITAMIN D3) 1000 units CAPS Take 1,000 Units by mouth daily.     [provider]  clobetasol cream (TEMOVATE) 7.10 % Apply 1 application topically in the morning and at bedtime. (apply to scalp, legs and arms)    [provider]  clopidogrel (PLAVIX) 75 MG tablet Take 1 tablet (75 mg total) by mouth daily. 08/15/19 08/14/20  Max Sane, MD  docusate sodium (COLACE) 100 MG capsule Take 100 mg by mouth daily.     [provider]  docusate sodium (COLACE) 100 MG capsule Take 100 mg by mouth every 8 (eight) hours as needed for mild constipation or moderate constipation.    [provider]  empagliflozin  (JARDIANCE) 25 MG TABS tablet Take 1 tablet (25 mg total) by mouth daily before breakfast. 08/15/19   Max Sane, MD  ethacrynic acid (EDECRIN) 25 MG tablet Take 3 tablets (75 mg total) by mouth 2 (two) times daily. 05/04/16   Vaughan Basta, MD  Exenatide ER (BYDUREON BCISE) 2 MG/0.85ML AUIJ Inject 2 mg into the skin every Wednesday.    [provider]  fluticasone (FLONASE) 50 MCG/ACT nasal spray Place 1 spray into both nostrils every 12 (twelve) hours as needed for allergies.     [provider]  HUMALOG 100 UNIT/ML cartridge Inject 5 Units into the skin See admin instructions. Inject 5u under the skin three times daily with meals with sliding scale    [provider]  hydrALAZINE (APRESOLINE) 25 MG tablet Take 25 mg by mouth 4 (four) times daily.     [provider]  HYDROcodone-acetaminophen (NORCO/VICODIN) 5-325 MG tablet Take 1 tablet by mouth every 6 (six) hours as needed for up to 4 doses for moderate pain or severe pain. 08/15/19   Max Sane, MD  insulin glargine (LANTUS) 100 UNIT/ML injection Inject 0.85 mLs (85 Units total) into the skin daily. Patient taking differently: Inject 43 Units into the skin 2 (two) times daily.  05/04/16   Vaughan Basta, MD  ipratropium-albuterol (DUONEB) 0.5-2.5 (3) MG/3ML SOLN Take 3 mLs by nebulization every 4 (four) hours as needed (congestion).    [provider]  Melatonin 1 MG TABS Take 3 mg by mouth at bedtime.     [provider]  nabumetone (RELAFEN) 500 MG tablet Take 500 mg by mouth daily.     [provider]  nitroGLYCERIN (NITROSTAT) 0.4 MG SL tablet Place 0.4 mg under the tongue every 5 (five) minutes as needed for chest pain.    [provider]  nystatin (MYCOSTATIN/NYSTOP) powder Apply 1 application topically 2 (two) times daily. (apply to perennial area)    [provider]  nystatin cream (MYCOSTATIN) Apply 1 application topically 2 (two) times daily.  (apply to perennial area)    [provider]  omeprazole (PRILOSEC) 20 MG capsule Take 20 mg by mouth every other day.    [provider]  perphenazine (TRILAFON) 2 MG tablet Take 1 mg by mouth 2 (two) times daily.     [provider]  polyethylene glycol (MIRALAX / GLYCOLAX) packet Take 17 g by mouth daily.    [provider]  sodium chloride (OCEAN) 0.65 % SOLN nasal spray Place 2 sprays into both nostrils every 8 (eight) hours as needed for congestion.    [provider]  vitamin B-12 (CYANOCOBALAMIN) 1000 MCG tablet Take 1,000 mcg by mouth  every Monday, Wednesday, and Friday.    [provider]    Physical Exam: Vitals:   01/16/20 2206 01/17/20 0030  BP: 114/63 115/67  Pulse: (!) 107 (!) 104  Resp: 18 17  Temp: 98.2 F (36.8 C)   TempSrc: Oral   SpO2: 100% 100%  Weight: 123.1 kg   Height: 5\' 6"  (1.676 m)      Vitals:   01/16/20 2206 01/17/20 0030  BP: 114/63 115/67  Pulse: (!) 107 (!) 104  Resp: 18 17  Temp: 98.2 F (36.8 C)   TempSrc: Oral   SpO2: 100% 100%  Weight: 123.1 kg   Height: 5\' 6"  (1.676 m)       Constitutional: Alert and oriented x 3 . Not in any apparent distress HEENT:      Head: Normocephalic and atraumatic.         Eyes: PERLA, EOMI, Conjunctivae are normal. Sclera is non-icteric.       Mouth/Throat: Mucous membranes are moist.       Neck: Supple with no signs of meningismus. Cardiovascular: Regular rate and rhythm. No murmurs, gallops, or rubs. 2+ symmetrical distal pulses are present . No JVD. No LE edema Respiratory: Respiratory effort normal .Lungs sounds clear bilaterally. No wheezes, crackles, or rhonchi.  Gastrointestinal: Soft, non tender, and non distended with positive bowel sounds. No rebound or guarding. Genitourinary: No CVA tenderness. Musculoskeletal: Nontender with normal range of motion in all extremities, disuse atrophy lower extremities. No cyanosis, or erythema of  extremities. Neurologic:  Face is symmetric. Moving all extremities. No gross focal neurologic deficits . Skin: Skin is warm, dry. Erythematous rash over sacrum Psychiatric: Depressed affect   Labs on Admission: I have personally reviewed following labs and imaging studies  CBC: Recent Labs  Lab 01/16/20 2233  WBC 11.0*  NEUTROABS 7.4  HGB 9.4*  HCT 28.2*  MCV 92.2  PLT 948*   Basic Metabolic Panel: Recent Labs  Lab 01/16/20 2233  NA 126*  K 5.1  CL 89*  CO2 22  GLUCOSE 194*  BUN 95*  CREATININE 4.59*  CALCIUM 8.1*   GFR: Estimated Creatinine Clearance: 15.9 mL/min (A) (by C-G formula based on SCr of 4.59 mg/dL (H)). Liver Function Tests: Recent Labs  Lab 01/16/20 2233  AST 15  ALT 11  ALKPHOS 114  BILITOT 1.0  PROT 7.0  ALBUMIN 1.6*   No results for input(s): LIPASE, AMYLASE in the last 168 hours. No results for input(s): AMMONIA in the last 168 hours. Coagulation Profile: No results for input(s): INR, PROTIME in the last 168 hours. Cardiac Enzymes: No results for input(s): CKTOTAL, CKMB, CKMBINDEX, TROPONINI in the last 168 hours. BNP (last 3 results) No results for input(s): PROBNP in the last 8760 hours. HbA1C: No results for input(s): HGBA1C in the last 72 hours. CBG: No results for input(s): GLUCAP in the last 168 hours. Lipid Profile: No results for input(s): CHOL, HDL, LDLCALC, TRIG, CHOLHDL, LDLDIRECT in the last 72 hours. Thyroid Function Tests: No results for input(s): TSH, T4TOTAL, FREET4, T3FREE, THYROIDAB in the last 72 hours. Anemia Panel: No results for input(s): VITAMINB12, FOLATE, FERRITIN, TIBC, IRON, RETICCTPCT in the last 72 hours. Urine analysis:    Component Value Date/Time   COLORURINE YELLOW (A) 08/13/2019 2328   APPEARANCEUR TURBID (A) 08/13/2019 2328   LABSPEC 1.005 08/13/2019 2328   PHURINE 5.0 08/13/2019 2328   GLUCOSEU >=500 (A) 08/13/2019 2328   HGBUR LARGE (A) 08/13/2019 [place] 08/13/2019 2328  [place] NEGATIVE 08/13/2019 2328   PROTEINUR 100 (A) 08/13/2019 2328   NITRITE NEGATIVE 08/13/2019 2328   LEUKOCYTESUR LARGE (A) 08/13/2019 2328    Radiological Exams on Admission: DG Chest Port 1 View  Result Date: 01/17/2020 CLINICAL DATA:  Weakness EXAM: PORTABLE CHEST 1 VIEW COMPARISON:  None. FINDINGS: The heart size and mediastinal contours are within normal limits. Both lungs are clear. The visualized skeletal structures are unremarkable. IMPRESSION: No active disease. Electronically Signed   By: Prudencio Pair M.D.   On: 01/17/2020 00:42     Assessment/Plan 69 year old female with history of COPD, chronic respiratory failure on home O2 at 2 L, OSA, systolic heart failure, last EF 30 to 35%, CKD4, type 2 diabetes, HTN, morbid obesity, presenting with diarrhea and abnormal labs.      Acute kidney injury superimposed on CKD (HCC) -Creatinine 4.59, up from baseline of 2.15 -Likely prerenal related to recent diarrheal illness -IV hydration, monitor renal function and avoid nephrotoxins    Diarrhea -Patient with history of recent antibiotic use for cellulitis of buttocks -Follow stool for C. difficile and GI panel -IV hydration    Hyponatremia -Sodium of 126, likely secondary to dehydration -IV hydration with normal saline    COPD with chronic bronchitis (HCC)   OSA (obstructive sleep apnea) Chronic respiratory failure -Chronic and stable -Continue home inhalers with duo nebs as needed -Supplemental oxygen to continue -Patient intolerant of CPAP    Obesity, Class III, BMI 40-49.9 (morbid obesity) (HCC) Bedbound -This complicates overall prognosis and care -Increase nursing care -Decubitus precautions    Anemia of chronic kidney failure, stage 4 (severe) (HCC) -Hemoglobin at baseline, slightly up from baseline likely secondary to hemoconcentration  Dermatitis buttocks -Suspect contact dermatitis/candidal yeast -Barrier precautions, zinc oxide cream -Avoid  antibiotics for right now.  Patient was recently on Keflex -Wound care consult    DVT prophylaxis: Lovenox  Code Status: full code  Family Communication:  none  Disposition Plan: Back to previous home environment Consults called: none  Status:At the time of admission, it appears that the appropriate admission status for this patient is INPATIENT. This is judged to be reasonable and necessary in order to provide the required intensity of service to ensure the patient's safety given the presenting symptoms, physical exam findings, and initial radiographic and laboratory data in the context of their  Comorbid conditions.   Patient requires inpatient status due to high intensity of service, high risk for further deterioration and high frequency of surveillance required.   I certify that at the point of admission it is my clinical judgment that the patient will require inpatient hospital care spanning beyond [place] MD [facility]     01/17/2020, 1:29 AM

## 2020-01-17 NOTE — Progress Notes (Signed)
PHARMACIST - PHYSICIAN COMMUNICATION  CONCERNING:  Enoxaparin (Lovenox) for DVT Prophylaxis    RECOMMENDATION: Patient was prescribed enoxaprin 40mg  q24 hours for VTE prophylaxis.   Filed Weights   01/16/20 2206  Weight: 123.1 kg (271 lb 6.2 oz)    Body mass index is 43.8 kg/m.  Estimated Creatinine Clearance: 15.5 mL/min (A) (by C-G formula based on SCr of 4.72 mg/dL (H)).   Patient is candidate for enoxaparin 30mg  every 24 hours based on CrCl <15ml/min or Weight <45kg  DESCRIPTION: Pharmacy has adjusted enoxaparin dose per [facility] policy.  Patient is now receiving enoxaparin 30 mg every 24 hours    Ena Dawley, PharmD Clinical Pharmacist  01/17/2020 6:18 AM

## 2020-01-18 LAB — BASIC METABOLIC PANEL
Anion gap: 13 (ref 5–15)
BUN: 92 mg/dL — ABNORMAL HIGH (ref 8–23)
CO2: 21 mmol/L — ABNORMAL LOW (ref 22–32)
Calcium: 7.3 mg/dL — ABNORMAL LOW (ref 8.9–10.3)
Chloride: 93 mmol/L — ABNORMAL LOW (ref 98–111)
Creatinine, Ser: 3.83 mg/dL — ABNORMAL HIGH (ref 0.44–1.00)
GFR, Estimated: 12 mL/min — ABNORMAL LOW (ref >60)
Glucose, Bld: 103 mg/dL — ABNORMAL HIGH (ref 70–99)
Potassium: 5.2 mmol/L — ABNORMAL HIGH (ref 3.5–5.1)
Sodium: 127 mmol/L — ABNORMAL LOW (ref 135–145)

## 2020-01-18 LAB — CBC WITH DIFFERENTIAL/PLATELET
Abs Immature Granulocytes: 0.14 10*3/uL — ABNORMAL HIGH (ref 0.00–0.07)
Basophils Absolute: 0 10*3/uL (ref 0.0–0.1)
Basophils Relative: 0 %
Eosinophils Absolute: 0.5 10*3/uL (ref 0.0–0.5)
Eosinophils Relative: 4 %
HCT: 25.7 % — ABNORMAL LOW (ref 36.0–46.0)
Hemoglobin: 8.3 g/dL — ABNORMAL LOW (ref 12.0–15.0)
Immature Granulocytes: 1 %
Lymphocytes Relative: 16 %
Lymphs Abs: 2.1 10*3/uL (ref 0.7–4.0)
MCH: 30.4 pg (ref 26.0–34.0)
MCHC: 32.3 g/dL (ref 30.0–36.0)
MCV: 94.1 fL (ref 80.0–100.0)
Monocytes Absolute: 0.9 10*3/uL (ref 0.1–1.0)
Monocytes Relative: 7 %
Neutro Abs: 9.6 10*3/uL — ABNORMAL HIGH (ref 1.7–7.7)
Neutrophils Relative %: 72 %
Platelets: 410 10*3/uL — ABNORMAL HIGH (ref 150–400)
RBC: 2.73 MIL/uL — ABNORMAL LOW (ref 3.87–5.11)
RDW: 17 % — ABNORMAL HIGH (ref 11.5–15.5)
WBC: 13.2 10*3/uL — ABNORMAL HIGH (ref 4.0–10.5)
nRBC: 0 % (ref 0.0–0.2)

## 2020-01-18 LAB — C DIFFICILE QUICK SCREEN W PCR REFLEX
C Diff antigen: NEGATIVE
C Diff interpretation: NOT DETECTED
C Diff toxin: NEGATIVE

## 2020-01-18 LAB — GLUCOSE, CAPILLARY
Glucose-Capillary: 91 mg/dL (ref 70–99)
Glucose-Capillary: 168 mg/dL — ABNORMAL HIGH (ref 70–99)
Glucose-Capillary: 195 mg/dL — ABNORMAL HIGH (ref 70–99)
Glucose-Capillary: 199 mg/dL — ABNORMAL HIGH (ref 70–99)

## 2020-01-18 LAB — SODIUM
Sodium: 127 mmol/L — ABNORMAL LOW (ref 135–145)
Sodium: 130 mmol/L — ABNORMAL LOW (ref 135–145)

## 2020-01-18 MED ORDER — SODIUM CHLORIDE 0.9 % IV SOLN
1.0000 g | INTRAVENOUS | Status: DC
  Administered 2020-01-18 – 2020-01-20 (×3): 1 g via INTRAVENOUS
  Filled 2020-01-18: qty 10
  Filled 2020-01-18 (×2): qty 1

## 2020-01-18 MED ORDER — CLOPIDOGREL BISULFATE 75 MG PO TABS
75.0000 mg | ORAL_TABLET | Freq: Every day | ORAL | Status: DC
  Administered 2020-01-18 – 2020-01-20 (×3): 75 mg via ORAL
  Filled 2020-01-18 (×3): qty 1

## 2020-01-18 MED ORDER — BENZTROPINE MESYLATE 1 MG PO TABS
1.0000 mg | ORAL_TABLET | Freq: Two times a day (BID) | ORAL | Status: DC
  Administered 2020-01-18 – 2020-01-20 (×5): 1 mg via ORAL
  Filled 2020-01-18 (×6): qty 1

## 2020-01-18 NOTE — Progress Notes (Signed)
PROGRESS NOTE    Gabrielle [name]  EXN:[id number] DOB: 1950-03-26 DOA: 01/17/2020 PCP: System, Provider Not In   Brief Narrative:  HPI: Gabrielle [name] is a 69 y.o. female with medical history significant for COPD, chronic respiratory failure on home O2 at 2 L, OSA, systolic heart failure, last EF 30 to 35%, CKD4, type 2 diabetes, HTN, morbid obesity, bedbound at baseline, who was sent from the nursing home for evaluation of abnormal labs.  Patient had been having diarrhea and generalized malaise and blood work was concerning for worsening renal function.  Patient had recently been on Keflex to treat cellulitis of buttocks.  She has had no fever or chills.  Has had no cough or shortness of breath beyond baseline ED Course: On arrival in the ED, BP 114/63, pulse 107, temp 98.2, O2 sat 100% on O2 at home flow rate.  Blood work significant for creatinine of 4.59, up from baseline around 2.15 in the past 6 months, with normal potassium and bicarb, sodium 126 hemoglobin 9.4, around her baseline for CKD.  WBC 11,000.  Covid and flu test negative.  Stool tests pending EKG as reviewed by me : Sinus tachycardia at 105 with no acute ST-T wave changes Imaging: Chest x-ray clear Patient was started on IV hydration.  Hospitalist consulted for admission.   Assessment & Plan:   Principal Problem:   Acute kidney injury superimposed on CKD ([place]) Active Problems:   AKI (acute kidney injury) ([place])   COPD with chronic bronchitis (HCC)   OSA (obstructive sleep apnea)   Obesity, Class III, BMI 40-49.9 (morbid obesity) (HCC)   Hyponatremia   Anemia of chronic kidney failure, stage 4 (severe) (HCC)   Diarrhea   Sacral decubitus ulcer     Acute kidney injury superimposed on CKD stage IV: -Creatinine 4.59, up from baseline of 2.15 (her baseline) -Likely prerenal related to recent diarrheal illness Some improvement.  Down to 3.8.  Continue IV hydration.  Repeat labs in the morning.  UTI/acute urinary retention:  Was complaining of abdominal pain yesterday and had acute urinary retention.  UA was checked and Foley catheter was placed.  CT abdomen was also done which indicates cystitis but no acute pathology.  Symptoms also suggestive of UTI and UA also indicative of UTI.  Will start on Rocephin.  Send urine culture.    Diarrhea: -Patient with history of recent antibiotic use for cellulitis of buttocks GI pathogen panel and C. difficile negative.  Diarrhea has improved.   Hypovolemic hyponatremia -Sodium of 126 upon presentation, likely secondary to dehydration.  Now improved to 127.  Continue normal saline and monitor frequently.  Hyperkalemia: 5.2.  Hopefully this will improve with hydration.  Repeat labs in the morning.    COPD with chronic bronchitis ([place]): Stable.  At baseline.  Continue home medications.    OSA (obstructive sleep apnea): Noted.  Continue nightly CPAP.  Chronic respiratory failure with hypoxia: At baseline. -Chronic and stable -Continue home inhalers with duo nebs as needed -Supplemental oxygen to continue -Patient intolerant of CPAP    Obesity, Class III, BMI 40-49.9 (morbid obesity) (HCC) Bedbound -This complicates overall prognosis and care -Increase nursing care -Decubitus precautions    Anemia of chronic kidney failure, stage 4 (severe) (HCC) -Hemoglobin at baseline, slightly up from baseline likely secondary to hemoconcentration  Dermatitis buttocks -Suspect contact dermatitis/candidal yeast -Barrier precautions, zinc oxide cream -Avoid antibiotics for right now.  Patient was recently on Keflex -Wound care consult  DVT prophylaxis:    Code  Status: Full Code  Family Communication: None present at bedside.  Plan of care discussed with patient in length and he verbalized understanding and agreed with it.  Status is: Inpatient  Remains inpatient appropriate because:Inpatient level of care appropriate due to severity of illness   Dispo: The patient is  from: SNF              Anticipated d/c is to: SNF              Anticipated d/c date is: 1 day              Patient currently is not medically stable to d/c.        Estimated body mass index is 71.17 kg/m as calculated from the following:   Height as of this encounter: 5\' 6"  (1.676 m).   Weight as of this encounter: 200 kg.  Pressure Injury 01/17/20 Thigh Posterior;Proximal;Right Stage 2 -  Partial thickness loss of dermis presenting as a shallow open injury with a red, pink wound bed without slough. slough (Active)  01/17/20 1246  Location: Thigh  Location Orientation: Posterior;Proximal;Right  Staging: Stage 2 -  Partial thickness loss of dermis presenting as a shallow open injury with a red, pink wound bed without slough.  Wound Description (Comments): slough  Present on Admission: Yes     Pressure Injury 01/17/20 Ischial tuberosity Right;Left Stage 3 -  Full thickness tissue loss. Subcutaneous fat may be visible but bone, tendon or muscle are NOT exposed. (Active)  01/17/20 1247  Location: Ischial tuberosity  Location Orientation: Right;Left  Staging: Stage 3 -  Full thickness tissue loss. Subcutaneous fat may be visible but bone, tendon or muscle are NOT exposed.  Wound Description (Comments):   Present on Admission: Yes     Pressure Injury 01/17/20 Ischial tuberosity Right;Left Stage 3 -  Full thickness tissue loss. Subcutaneous fat may be visible but bone, tendon or muscle are NOT exposed. (Active)  01/17/20 1255  Location: Ischial tuberosity  Location Orientation: Right;Left  Staging: Stage 3 -  Full thickness tissue loss. Subcutaneous fat may be visible but bone, tendon or muscle are NOT exposed.  Wound Description (Comments):   Present on Admission:      Nutritional status:               Consultants:   None  Procedures:   None  Antimicrobials:  Anti-infectives (From admission, onward)   Start     Dose/Rate Route Frequency Ordered Stop    01/18/20 1145  cefTRIAXone (ROCEPHIN) 1 g in sodium chloride 0.9 % 100 mL IVPB        1 g 200 mL/hr over 30 Minutes Intravenous Every 24 hours 01/18/20 1059           Subjective: Patient seen and examined.  Much more alert and brighter today.  Still complains of some abdominal discomfort but does not come up with any specific location.  No other complaint.  Was able to gather more history from her and she confirmed that prior to coming, she was having some urinary hesitation but no dysuria.  Objective: Vitals:   01/17/20 1935 01/18/20 0018 01/18/20 0601 01/18/20 0807  BP: (!) 143/80 101/83 (!) 141/94 (!) 100/52  Pulse: (!) 104 90 (!) 102 81  Resp: 16 16 17 16   Temp: (!) 97.5 F (36.4 C) 97.7 F (36.5 C) 97.7 F (36.5 C) 97.6 F (36.4 C)  TempSrc: Oral Oral Oral   SpO2: 100% 100% 100% 100%  Weight:      Height:        Intake/Output Summary (Last 24 hours) at 01/18/2020 1059 Last data filed at 01/18/2020 1025 Gross per 24 hour  Intake 430.4 ml  Output 800 ml  Net -369.6 ml   Filed Weights   01/16/20 2206 01/17/20 1700  Weight: 123.1 kg (!) 200 kg    Examination:  General exam: Appears calm and comfortable, morbidly obese Respiratory system: Clear to auscultation. Respiratory effort normal. Cardiovascular system: S1 & S2 heard, RRR. No JVD, murmurs, rubs, gallops or clicks.  +1 pitting edema bilateral lower extremity Gastrointestinal system: Abdomen is nondistended, soft and nontender. No organomegaly or masses felt. Normal bowel sounds heard. Central nervous system: Alert and oriented. No focal neurological deficits. Extremities: Symmetric 5 x 5 power. Skin: No rashes, lesions or ulcers.  Psychiatry: Judgement and insight appear normal. Mood & affect appropriate.    Data Reviewed: I have personally reviewed following labs and imaging studies  CBC: Recent Labs  Lab 01/16/20 2233 01/17/20 0432 01/18/20 0336  WBC 11.0* 11.3* 13.2*  NEUTROABS 7.4  --  9.6*  HGB  9.4* 9.1* 8.3*  HCT 28.2* 28.6* 25.7*  MCV 92.2 94.4 94.1  PLT 479* 467* 712*   Basic Metabolic Panel: Recent Labs  Lab 01/16/20 2233 01/17/20 0432 01/17/20 1326 01/17/20 2001 01/18/20 0336  NA 126* 124* 127* 127* 127*  K 5.1 5.0  --   --  5.2*  CL 89* 88*  --   --  93*  CO2 22 20*  --   --  21*  GLUCOSE 194* 214*  --   --  103*  BUN 95* 97*  --   --  92*  CREATININE 4.59* 4.72*  --   --  3.83*  CALCIUM 8.1* 7.9*  --   --  7.3*   GFR: Estimated Creatinine Clearance: 26 mL/min (A) (by C-G formula based on SCr of 3.83 mg/dL (H)). Liver Function Tests: Recent Labs  Lab 01/16/20 2233  AST 15  ALT 11  ALKPHOS 114  BILITOT 1.0  PROT 7.0  ALBUMIN 1.6*   No results for input(s): LIPASE, AMYLASE in the last 168 hours. No results for input(s): AMMONIA in the last 168 hours. Coagulation Profile: No results for input(s): INR, PROTIME in the last 168 hours. Cardiac Enzymes: No results for input(s): CKTOTAL, CKMB, CKMBINDEX, TROPONINI in the last 168 hours. BNP (last 3 results) No results for input(s): PROBNP in the last 8760 hours. HbA1C: Recent Labs    01/17/20 0432  HGBA1C 5.7*   CBG: Recent Labs  Lab 01/17/20 1210 01/17/20 1640 01/17/20 2119 01/18/20 0807  GLUCAP 163* 200* 148* 91   Lipid Profile: No results for input(s): CHOL, HDL, LDLCALC, TRIG, CHOLHDL, LDLDIRECT in the last 72 hours. Thyroid Function Tests: No results for input(s): TSH, T4TOTAL, FREET4, T3FREE, THYROIDAB in the last 72 hours. Anemia Panel: No results for input(s): VITAMINB12, FOLATE, FERRITIN, TIBC, IRON, RETICCTPCT in the last 72 hours. Sepsis Labs: Recent Labs  Lab 01/17/20 0030  LATICACIDVEN 1.2    Recent Results (from the past 240 hour(s))  Resp Panel by RT-PCR (Flu A&B, Covid) Nasopharyngeal Swab     Status: None   Collection Time: 01/16/20 10:33 PM   Specimen: Nasopharyngeal Swab; Nasopharyngeal(NP) swabs in vial transport medium  Result Value Ref Range Status   SARS  Coronavirus 2 by RT PCR NEGATIVE NEGATIVE Final    Comment: (NOTE) SARS-CoV-2 target nucleic acids are NOT DETECTED.  The SARS-CoV-2 RNA  is generally detectable in upper respiratory specimens during the acute phase of infection. The lowest concentration of SARS-CoV-2 viral copies this assay can detect is 138 copies/mL. A negative result does not preclude SARS-Cov-2 infection and should not be used as the sole basis for treatment or other patient management decisions. A negative result may occur with  improper specimen collection/handling, submission of specimen other than nasopharyngeal swab, presence of viral mutation(s) within the areas targeted by this assay, and inadequate number of viral copies(<138 copies/mL). A negative result must be combined with clinical observations, patient history, and epidemiological information. The expected result is Negative.  Fact Sheet for Patients:  EntrepreneurPulse.com.au  Fact Sheet for Healthcare Providers:  IncredibleEmployment.be  This test is no t yet approved or cleared by the [place] FDA and  has been authorized for detection and/or diagnosis of SARS-CoV-2 by FDA under an Emergency Use Authorization (EUA). This EUA will remain  in effect (meaning this test can be used) for the duration of the COVID-19 declaration under Section 564(b)(1) of the Act, 21 U.S.C.section 360bbb-3(b)(1), unless the authorization is terminated  or revoked sooner.       Influenza A by PCR NEGATIVE NEGATIVE Final   Influenza B by PCR NEGATIVE NEGATIVE Final    Comment: (NOTE) The Xpert Xpress SARS-CoV-2/FLU/RSV plus assay is intended as an aid in the diagnosis of influenza from Nasopharyngeal swab specimens and should not be used as a sole basis for treatment. Nasal washings and aspirates are unacceptable for Xpert Xpress SARS-CoV-2/FLU/RSV testing.  Fact Sheet for  Patients: EntrepreneurPulse.com.au  Fact Sheet for Healthcare Providers: IncredibleEmployment.be  This test is not yet approved or cleared by the [place] FDA and has been authorized for detection and/or diagnosis of SARS-CoV-2 by FDA under an Emergency Use Authorization (EUA). This EUA will remain in effect (meaning this test can be used) for the duration of the COVID-19 declaration under Section 564(b)(1) of the Act, 21 U.S.C. section 360bbb-3(b)(1), unless the authorization is terminated or revoked.  Performed at [facility], [address] [place], [place] [address]   Culture, blood (routine x 2)     Status: None (Preliminary result)   Collection Time: 01/17/20 12:30 AM   Specimen: BLOOD  Result Value Ref Range Status   Specimen Description BLOOD RIGHT ANTECUBITAL  Final   Special Requests   Final    BOTTLES DRAWN AEROBIC AND ANAEROBIC Blood Culture adequate volume   Culture   Final    NO GROWTH 1 DAY Performed at [facility], [address] [place], [place] [address]    Report Status PENDING  Incomplete  Culture, blood (routine x 2)     Status: None (Preliminary result)   Collection Time: 01/17/20 12:30 AM   Specimen: BLOOD  Result Value Ref Range Status   Specimen Description   Final    BLOOD Blood Culture results may not be optimal due to an excessive volume of blood received in culture bottles   Special Requests   Final    BOTTLES DRAWN AEROBIC AND ANAEROBIC LEFT ANTECUBITAL   Culture   Final    NO GROWTH 1 DAY Performed at [facility], [address] [place], [place] [address]    Report Status PENDING  Incomplete  C Difficile Quick Screen w PCR reflex     Status: None   Collection Time: 01/17/20 12:30 AM   Specimen: STOOL  Result Value Ref Range Status   C Diff antigen NEGATIVE NEGATIVE Final  C Diff toxin NEGATIVE NEGATIVE Final   C Diff interpretation No C. difficile detected.   Final    Comment: Performed at [facility], [address] [place], [place] [address]  Gastrointestinal Panel by PCR , Stool     Status: None   Collection Time: 01/17/20 12:30 AM   Specimen: Stool  Result Value Ref Range Status   Campylobacter species NOT DETECTED NOT DETECTED Final   Plesimonas shigelloides NOT DETECTED NOT DETECTED Final   Salmonella species NOT DETECTED NOT DETECTED Final   Yersinia enterocolitica NOT DETECTED NOT DETECTED Final   Vibrio species NOT DETECTED NOT DETECTED Final   Vibrio cholerae NOT DETECTED NOT DETECTED Final   Enteroaggregative E coli (EAEC) NOT DETECTED NOT DETECTED Final   Enteropathogenic E coli (EPEC) NOT DETECTED NOT DETECTED Final   Enterotoxigenic E coli (ETEC) NOT DETECTED NOT DETECTED Final   Shiga like toxin producing E coli (STEC) NOT DETECTED NOT DETECTED Final   Shigella/Enteroinvasive E coli (EIEC) NOT DETECTED NOT DETECTED Final   Cryptosporidium NOT DETECTED NOT DETECTED Final   Cyclospora cayetanensis NOT DETECTED NOT DETECTED Final   Entamoeba histolytica NOT DETECTED NOT DETECTED Final   Giardia lamblia NOT DETECTED NOT DETECTED Final   Adenovirus F40/41 NOT DETECTED NOT DETECTED Final   Astrovirus NOT DETECTED NOT DETECTED Final   Norovirus GI/GII NOT DETECTED NOT DETECTED Final   Rotavirus A NOT DETECTED NOT DETECTED Final   Sapovirus (I, II, IV, and V) NOT DETECTED NOT DETECTED Final    Comment: Performed at [facility], [address] [place], [place] [address]      Radiology Studies: CT ABDOMEN PELVIS WO CONTRAST  Result Date: 01/17/2020 CLINICAL DATA:  Abdominal pain, acute, nonlocalized. Diarrhea, generalized malaise, labs indicating worsening renal function. EXAM: CT ABDOMEN AND PELVIS WITHOUT CONTRAST TECHNIQUE: Multidetector CT imaging of the abdomen and pelvis was performed following the standard protocol without IV contrast. COMPARISON:  CT abdomen pelvis 08/17/2019 FINDINGS: Lower chest:  Hyperdense myocardium compared to the cardiac chambers likely representative of anemia. Coronary artery calcifications. Possible trace mitral annular calcification. Persistent trace left pleural effusion. Otherwise no acute abnormality. Hepatobiliary: The hepatic parenchyma is diffusely hypodense compared to the splenic parenchyma consistent with fatty infiltration. No focal liver abnormality. Calcified gallstones within the gallbladder lumen. Otherwise no gallbladder wall thickening or pericholecystic fluid. No biliary dilatation. Pancreas: No focal lesion. Normal pancreatic contour. No surrounding inflammatory changes. No main pancreatic ductal dilatation. Spleen: Normal in size without focal abnormality. Adrenals/Urinary Tract: No adrenal nodule bilaterally. Bilateral renal cortical scarring. No nephrolithiasis, no hydronephrosis, and no contour-deforming renal mass. No ureterolithiasis or hydroureter. The urinary bladder is distended with urine. There is mild urinary bladder wall thickening and trace perivesicular fat stranding. A Foley catheter is noted to terminate within the lumen of the urinary bladder. Stomach/Bowel: PO contrast reaches the distal transverse colon. Stomach is within normal limits. No evidence of bowel wall thickening or dilatation. Appendix appears normal. Vascular/Lymphatic: No abdominal aorta or iliac aneurysm. Mild atherosclerotic plaque of the aorta and its branches. No abdominal, pelvic, or inguinal lymphadenopathy. Redemonstration of hepatoduodenal nodal calcifications. Reproductive: Uterus and bilateral adnexa are unremarkable. Other: No intraperitoneal free fluid. No intraperitoneal free gas. No organized fluid collection. Musculoskeletal: No abdominal wall hernia or abnormality. No suspicious lytic or blastic osseous lesions. Slightly heterogeneous osseous structures could be due to chronic renal disease. No acute displaced fracture. 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 Multilevel  degenerative changes of the spine. IMPRESSION: 1. Foley catheter iwithin an incompletely decompressed urinary bladder. Mild urinary bladder wall thickening and trace perivesicular fat stranding could represent infection. Correlate with urinalysis. 2. Cholelithiasis with no acute cholecystitis. 3. Hepatic steatosis. 4. Stable trace left pleural effusion. 5.  Aortic Atherosclerosis (ICD10-I70.0). Electronically Signed   By: Iven Finn M.D.   On: 01/17/2020 20:59   DG Chest Port 1 View  Result Date: 01/17/2020 CLINICAL DATA:  Weakness EXAM: PORTABLE CHEST 1 VIEW COMPARISON:  None. FINDINGS: The heart size and mediastinal contours are within normal limits. Both lungs are clear. The visualized skeletal structures are unremarkable. IMPRESSION: No active disease. Electronically Signed   By: Prudencio Pair M.D.   On: 01/17/2020 00:42    Scheduled Meds: . benztropine  1 mg Oral BID  . Chlorhexidine Gluconate Cloth  6 each Topical Daily  . clopidogrel  75 mg Oral Daily  . collagenase   Topical Daily  . Gerhardt's butt cream   Topical BID  . insulin aspart  0-20 Units Subcutaneous TID WC  . insulin aspart  0-5 Units Subcutaneous QHS  . perphenazine  1 mg Oral BID   Continuous Infusions: . sodium chloride 100 mL/hr at 01/17/20 0626  . cefTRIAXone (ROCEPHIN)  IV       LOS: 1 day   Time spent: 30 minutes   Darliss Cheney, MD [facility]  01/18/2020, 10:59 AM   To contact the attending provider between 7A-7P or the covering provider during after hours 7P-7A, please log into the web site [email or web address].

## 2020-01-18 NOTE — TOC Progression Note (Signed)
Transition of Care [facility]) - Progression Note    Patient Details  Name: Gabrielle [name] MRN: [id number] Date of Birth: 01-27-1951  Transition of Care [facility]) CM/SW Contact  Izola Price, RN Phone Number: 01/18/2020, 11:43 AM  Clinical Narrative:   12/11 Provider notes indicate W/U for UTI/Retention. Foley catheter placed and cultures obtained. IV fluids today for hydration per this am provider notes. Noted to be a [facility] long term resident. CM will monitor for discharge needs. Simmie Davies RN CM         Expected Discharge Plan and Services                                                 Social Determinants of Health (SDOH) Interventions    Readmission Risk Interventions No flowsheet data found.

## 2020-01-19 LAB — CBC WITH DIFFERENTIAL/PLATELET
Abs Immature Granulocytes: 0.25 10*3/uL — ABNORMAL HIGH (ref 0.00–0.07)
Basophils Absolute: 0.1 10*3/uL (ref 0.0–0.1)
Basophils Relative: 0 %
Eosinophils Absolute: 0.6 10*3/uL — ABNORMAL HIGH (ref 0.0–0.5)
Eosinophils Relative: 4 %
HCT: 26.5 % — ABNORMAL LOW (ref 36.0–46.0)
Hemoglobin: 8.6 g/dL — ABNORMAL LOW (ref 12.0–15.0)
Immature Granulocytes: 2 %
Lymphocytes Relative: 19 %
Lymphs Abs: 2.4 10*3/uL (ref 0.7–4.0)
MCH: 30.4 pg (ref 26.0–34.0)
MCHC: 32.5 g/dL (ref 30.0–36.0)
MCV: 93.6 fL (ref 80.0–100.0)
Monocytes Absolute: 1 10*3/uL (ref 0.1–1.0)
Monocytes Relative: 8 %
Neutro Abs: 8.6 10*3/uL — ABNORMAL HIGH (ref 1.7–7.7)
Neutrophils Relative %: 67 %
Platelets: 438 10*3/uL — ABNORMAL HIGH (ref 150–400)
RBC: 2.83 MIL/uL — ABNORMAL LOW (ref 3.87–5.11)
RDW: 17.2 % — ABNORMAL HIGH (ref 11.5–15.5)
WBC: 12.8 10*3/uL — ABNORMAL HIGH (ref 4.0–10.5)
nRBC: 0 % (ref 0.0–0.2)

## 2020-01-19 LAB — BASIC METABOLIC PANEL WITH GFR
Anion gap: 14 (ref 5–15)
BUN: 85 mg/dL — ABNORMAL HIGH (ref 8–23)
CO2: 21 mmol/L — ABNORMAL LOW (ref 22–32)
Calcium: 7.5 mg/dL — ABNORMAL LOW (ref 8.9–10.3)
Chloride: 96 mmol/L — ABNORMAL LOW (ref 98–111)
Creatinine, Ser: 3.12 mg/dL — ABNORMAL HIGH (ref 0.44–1.00)
GFR, Estimated: 16 mL/min — ABNORMAL LOW
Glucose, Bld: 126 mg/dL — ABNORMAL HIGH (ref 70–99)
Potassium: 4.4 mmol/L (ref 3.5–5.1)
Sodium: 131 mmol/L — ABNORMAL LOW (ref 135–145)

## 2020-01-19 LAB — GLUCOSE, CAPILLARY
Glucose-Capillary: 137 mg/dL — ABNORMAL HIGH (ref 70–99)
Glucose-Capillary: 181 mg/dL — ABNORMAL HIGH (ref 70–99)
Glucose-Capillary: 203 mg/dL — ABNORMAL HIGH (ref 70–99)
Glucose-Capillary: 161 mg/dL — ABNORMAL HIGH (ref 70–99)

## 2020-01-19 LAB — RESP PANEL BY RT-PCR (FLU A&B, COVID) ARPGX2
Influenza A by PCR: NEGATIVE
Influenza B by PCR: NEGATIVE
SARS Coronavirus 2 by RT PCR: NEGATIVE

## 2020-01-19 MED ORDER — LACTULOSE ENEMA
300.0000 mL | Freq: Once | ORAL | Status: AC
  Administered 2020-01-19: 15:00:00 300 mL via RECTAL
  Filled 2020-01-19: qty 300

## 2020-01-19 NOTE — Progress Notes (Signed)
PROGRESS NOTE    Gabrielle [name]  WHQ:[id number] DOB: [birth year] DOA: 01/17/2020 PCP: System, Provider Not In   Brief Narrative:  HPI: Gabrielle [name] is a 69 y.o. female with medical history significant for COPD, chronic respiratory failure on home O2 at 2 L, OSA, systolic heart failure, last EF 30 to 35%, CKD4, type 2 diabetes, HTN, morbid obesity, bedbound at baseline, who was sent from the nursing home for evaluation of abnormal labs.  Patient had been having diarrhea and generalized malaise and blood work was concerning for worsening renal function.  Patient had recently been on Keflex to treat cellulitis of buttocks.  She has had no fever or chills.  Has had no cough or shortness of breath beyond baseline ED Course: On arrival in the ED, BP 114/63, pulse 107, temp 98.2, O2 sat 100% on O2 at home flow rate.  Blood work significant for creatinine of 4.59, up from baseline around 2.15 in the past 6 months, with normal potassium and bicarb, sodium 126 hemoglobin 9.4, around her baseline for CKD.  WBC 11,000.  Covid and flu test negative.  Stool tests pending EKG as reviewed by me : Sinus tachycardia at 105 with no acute ST-T wave changes Imaging: Chest x-ray clear Patient was started on IV hydration.  Hospitalist consulted for admission.   Assessment & Plan:   Principal Problem:   Acute kidney injury superimposed on CKD ([place]) Active Problems:   AKI (acute kidney injury) ([place])   COPD with chronic bronchitis (HCC)   OSA (obstructive sleep apnea)   Obesity, Class III, BMI 40-49.9 (morbid obesity) (HCC)   Hyponatremia   Anemia of chronic kidney failure, stage 4 (severe) (HCC)   Diarrhea   Sacral decubitus ulcer     Acute kidney injury superimposed on CKD stage IV: -Creatinine 4.59, up from baseline of 2.15 (her baseline) -Likely prerenal related to recent diarrheal illness Some improvement.  Down to 3.2.  Continue IV hydration.  Repeat labs in the morning.  UTI/acute urinary retention:  Continue Rocephin.  Follow culture which is negative to date.  Remove Foley catheter today.  Allow time for voiding trial.    Diarrhea: -Patient with history of recent antibiotic use for cellulitis of buttocks GI pathogen panel and C. difficile negative.  Diarrhea has improved.   Hypovolemic hyponatremia -Sodium of 126 upon presentation, likely secondary to dehydration.  Now improved to 131.  Continue normal saline and monitor frequently.  Hyperkalemia: Resolved.    COPD with chronic bronchitis ([place]): Stable.  At baseline.  Continue home medications.    OSA (obstructive sleep apnea): Noted.  Continue nightly CPAP.  Chronic respiratory failure with hypoxia: At baseline. -Chronic and stable -Continue home inhalers with duo nebs as needed -Supplemental oxygen to continue -Patient intolerant of CPAP    Obesity, Class III, BMI 40-49.9 (morbid obesity) (HCC) Bedbound -This complicates overall prognosis and care -Increase nursing care -Decubitus precautions    Anemia of chronic kidney failure, stage 4 (severe) (HCC) -Hemoglobin at baseline, slightly up from baseline likely secondary to hemoconcentration  Dermatitis buttocks -Suspect contact dermatitis/candidal yeast -Barrier precautions, zinc oxide cream -Avoid antibiotics for right now.  Patient was recently on Keflex -Wound care consult  DVT prophylaxis:    Code Status: Full Code  Family Communication: None present at bedside.  Plan of care discussed with patient in length and he verbalized understanding and agreed with it.  Status is: Inpatient  Remains inpatient appropriate because:Inpatient level of care appropriate due to severity of illness  Dispo: The patient is from: SNF              Anticipated d/c is to: SNF              Anticipated d/c date is: 1 day              Patient currently is not medically stable to d/c.        Estimated body mass index is 71.17 kg/m as calculated from the following:    Height as of this encounter: 5\' 6"  (1.676 m).   Weight as of this encounter: 200 kg.  Pressure Injury 01/17/20 Thigh Posterior;Proximal;Right Stage 2 -  Partial thickness loss of dermis presenting as a shallow open injury with a red, pink wound bed without slough. slough (Active)  01/17/20 1246  Location: Thigh  Location Orientation: Posterior;Proximal;Right  Staging: Stage 2 -  Partial thickness loss of dermis presenting as a shallow open injury with a red, pink wound bed without slough.  Wound Description (Comments): slough  Present on Admission: Yes     Pressure Injury 01/17/20 Ischial tuberosity Right;Left Stage 3 -  Full thickness tissue loss. Subcutaneous fat may be visible but bone, tendon or muscle are NOT exposed. (Active)  01/17/20 1247  Location: Ischial tuberosity  Location Orientation: Right;Left  Staging: Stage 3 -  Full thickness tissue loss. Subcutaneous fat may be visible but bone, tendon or muscle are NOT exposed.  Wound Description (Comments):   Present on Admission: Yes     Pressure Injury 01/17/20 Ischial tuberosity Right;Left Stage 3 -  Full thickness tissue loss. Subcutaneous fat may be visible but bone, tendon or muscle are NOT exposed. (Active)  01/17/20 1255  Location: Ischial tuberosity  Location Orientation: Right;Left  Staging: Stage 3 -  Full thickness tissue loss. Subcutaneous fat may be visible but bone, tendon or muscle are NOT exposed.  Wound Description (Comments):   Present on Admission:      Nutritional status:               Consultants:   None  Procedures:   None  Antimicrobials:  Anti-infectives (From admission, onward)   Start     Dose/Rate Route Frequency Ordered Stop   01/18/20 1145  cefTRIAXone (ROCEPHIN) 1 g in sodium chloride 0.9 % 100 mL IVPB        1 g 200 mL/hr over 30 Minutes Intravenous Every 24 hours 01/18/20 1059           Subjective: Patient seen and examined.  Feels better.  No new complaint.  She feels  she has a lot of stool burden and is requesting some enema.  She is very apprehensive about going back to the SNF facility where she has been for last 3 years due to her concerns that she does not get good care there.  I have related her concerns to the case manager to relay this to the facility.  Objective: Vitals:   01/18/20 2251 01/19/20 0419 01/19/20 0751 01/19/20 1142  BP: (!) 114/59 123/66 (!) 106/58 (!) 98/40  Pulse: 95 87 80 80  Resp: 16 17 15 16   Temp: 97.7 F (36.5 C) 97.6 F (36.4 C) (!) 97.4 F (36.3 C) 98 F (36.7 C)  TempSrc: Oral Oral    SpO2: 100% 100% 100% 100%  Weight:      Height:        Intake/Output Summary (Last 24 hours) at 01/19/2020 1331 Last data filed at 01/19/2020 1020 Gross per 24  hour  Intake 100.06 ml  Output 1325 ml  Net -1224.94 ml   Filed Weights   01/16/20 2206 01/17/20 1700  Weight: 123.1 kg (!) 200 kg    Examination:  General exam: Appears calm and comfortable, morbidly obese Respiratory system: Clear to auscultation. Respiratory effort normal. Cardiovascular system: S1 & S2 heard, RRR. No JVD, murmurs, rubs, gallops or clicks.  +1 pitting edema bilateral lower extremity Gastrointestinal system: Abdomen is nondistended, soft and nontender. No organomegaly or masses felt. Normal bowel sounds heard. Central nervous system: Alert and oriented.  Skin: No rashes, lesions or ulcers.  Psychiatry: Judgement and insight appear normal. Mood & affect appropriate.     Data Reviewed: I have personally reviewed following labs and imaging studies  CBC: Recent Labs  Lab 01/16/20 2233 01/17/20 0432 01/18/20 0336 01/19/20 0425  WBC 11.0* 11.3* 13.2* 12.8*  NEUTROABS 7.4  --  9.6* 8.6*  HGB 9.4* 9.1* 8.3* 8.6*  HCT 28.2* 28.6* 25.7* 26.5*  MCV 92.2 94.4 94.1 93.6  PLT 479* 467* 410* 409*   Basic Metabolic Panel: Recent Labs  Lab 01/16/20 2233 01/17/20 0432 01/17/20 1326 01/17/20 2001 01/18/20 0336 01/18/20 1341 01/18/20 1942  01/19/20 0425  NA 126* 124*   < > 127* 127* 127* 130* 131*  K 5.1 5.0  --   --  5.2*  --   --  4.4  CL 89* 88*  --   --  93*  --   --  96*  CO2 22 20*  --   --  21*  --   --  21*  GLUCOSE 194* 214*  --   --  103*  --   --  126*  BUN 95* 97*  --   --  92*  --   --  85*  CREATININE 4.59* 4.72*  --   --  3.83*  --   --  3.12*  CALCIUM 8.1* 7.9*  --   --  7.3*  --   --  7.5*   < > = values in this interval not displayed.   GFR: Estimated Creatinine Clearance: 31.9 mL/min (A) (by C-G formula based on SCr of 3.12 mg/dL (H)). Liver Function Tests: Recent Labs  Lab 01/16/20 2233  AST 15  ALT 11  ALKPHOS 114  BILITOT 1.0  PROT 7.0  ALBUMIN 1.6*   No results for input(s): LIPASE, AMYLASE in the last 168 hours. No results for input(s): AMMONIA in the last 168 hours. Coagulation Profile: No results for input(s): INR, PROTIME in the last 168 hours. Cardiac Enzymes: No results for input(s): CKTOTAL, CKMB, CKMBINDEX, TROPONINI in the last 168 hours. BNP (last 3 results) No results for input(s): PROBNP in the last 8760 hours. HbA1C: Recent Labs    01/17/20 0432  HGBA1C 5.7*   CBG: Recent Labs  Lab 01/18/20 1203 01/18/20 1637 01/18/20 2144 01/19/20 0753 01/19/20 1143  GLUCAP 168* 195* 199* 137* 181*   Lipid Profile: No results for input(s): CHOL, HDL, LDLCALC, TRIG, CHOLHDL, LDLDIRECT in the last 72 hours. Thyroid Function Tests: No results for input(s): TSH, T4TOTAL, FREET4, T3FREE, THYROIDAB in the last 72 hours. Anemia Panel: No results for input(s): VITAMINB12, FOLATE, FERRITIN, TIBC, IRON, RETICCTPCT in the last 72 hours. Sepsis Labs: Recent Labs  Lab 01/17/20 0030  LATICACIDVEN 1.2    Recent Results (from the past 240 hour(s))  Resp Panel by RT-PCR (Flu A&B, Covid) Nasopharyngeal Swab     Status: None   Collection Time: 01/16/20 10:33 PM  Specimen: Nasopharyngeal Swab; Nasopharyngeal(NP) swabs in vial transport medium  Result Value Ref Range Status   SARS  Coronavirus 2 by RT PCR NEGATIVE NEGATIVE Final    Comment: (NOTE) SARS-CoV-2 target nucleic acids are NOT DETECTED.  The SARS-CoV-2 RNA is generally detectable in upper respiratory specimens during the acute phase of infection. The lowest concentration of SARS-CoV-2 viral copies this assay can detect is 138 copies/mL. A negative result does not preclude SARS-Cov-2 infection and should not be used as the sole basis for treatment or other patient management decisions. A negative result may occur with  improper specimen collection/handling, submission of specimen other than nasopharyngeal swab, presence of viral mutation(s) within the areas targeted by this assay, and inadequate number of viral copies(<138 copies/mL). A negative result must be combined with clinical observations, patient history, and epidemiological information. The expected result is Negative.  Fact Sheet for Patients:  EntrepreneurPulse.com.au  Fact Sheet for Healthcare Providers:  IncredibleEmployment.be  This test is no t yet approved or cleared by the [place] FDA and  has been authorized for detection and/or diagnosis of SARS-CoV-2 by FDA under an Emergency Use Authorization (EUA). This EUA will remain  in effect (meaning this test can be used) for the duration of the COVID-19 declaration under Section 564(b)(1) of the Act, 21 U.S.C.section 360bbb-3(b)(1), unless the authorization is terminated  or revoked sooner.       Influenza A by PCR NEGATIVE NEGATIVE Final   Influenza B by PCR NEGATIVE NEGATIVE Final    Comment: (NOTE) The Xpert Xpress SARS-CoV-2/FLU/RSV plus assay is intended as an aid in the diagnosis of influenza from Nasopharyngeal swab specimens and should not be used as a sole basis for treatment. Nasal washings and aspirates are unacceptable for Xpert Xpress SARS-CoV-2/FLU/RSV testing.  Fact Sheet for  Patients: EntrepreneurPulse.com.au  Fact Sheet for Healthcare Providers: IncredibleEmployment.be  This test is not yet approved or cleared by the [place] FDA and has been authorized for detection and/or diagnosis of SARS-CoV-2 by FDA under an Emergency Use Authorization (EUA). This EUA will remain in effect (meaning this test can be used) for the duration of the COVID-19 declaration under Section 564(b)(1) of the Act, 21 U.S.C. section 360bbb-3(b)(1), unless the authorization is terminated or revoked.  Performed at [facility], [address] [place], [place] [address]   Culture, blood (routine x 2)     Status: None (Preliminary result)   Collection Time: 01/17/20 12:30 AM   Specimen: BLOOD  Result Value Ref Range Status   Specimen Description BLOOD RIGHT ANTECUBITAL  Final   Special Requests   Final    BOTTLES DRAWN AEROBIC AND ANAEROBIC Blood Culture adequate volume   Culture   Final    NO GROWTH 2 DAYS Performed at [facility], [address] [place], [place] [address]    Report Status PENDING  Incomplete  Culture, blood (routine x 2)     Status: None (Preliminary result)   Collection Time: 01/17/20 12:30 AM   Specimen: BLOOD  Result Value Ref Range Status   Specimen Description   Final    BLOOD Blood Culture results may not be optimal due to an excessive volume of blood received in culture bottles   Special Requests   Final    BOTTLES DRAWN AEROBIC AND ANAEROBIC LEFT ANTECUBITAL   Culture   Final    NO GROWTH 2 DAYS Performed at [facility], [address] [place], [place] [address]    Report Status PENDING  Incomplete  C Difficile Quick Screen w PCR reflex     Status: None   Collection Time: 01/17/20 12:30 AM   Specimen: STOOL  Result Value Ref Range Status   C Diff antigen NEGATIVE NEGATIVE Final   C Diff toxin NEGATIVE NEGATIVE Final   C Diff interpretation No C. difficile detected.   Final    Comment: Performed at [facility], [address] [place], [place] [address]  Gastrointestinal Panel by PCR , Stool     Status: None   Collection Time: 01/17/20 12:30 AM   Specimen: Stool  Result Value Ref Range Status   Campylobacter species NOT DETECTED NOT DETECTED Final   Plesimonas shigelloides NOT DETECTED NOT DETECTED Final   Salmonella species NOT DETECTED NOT DETECTED Final   Yersinia enterocolitica NOT DETECTED NOT DETECTED Final   Vibrio species NOT DETECTED NOT DETECTED Final   Vibrio cholerae NOT DETECTED NOT DETECTED Final   Enteroaggregative E coli (EAEC) NOT DETECTED NOT DETECTED Final   Enteropathogenic E coli (EPEC) NOT DETECTED NOT DETECTED Final   Enterotoxigenic E coli (ETEC) NOT DETECTED NOT DETECTED Final   Shiga like toxin producing E coli (STEC) NOT DETECTED NOT DETECTED Final   Shigella/Enteroinvasive E coli (EIEC) NOT DETECTED NOT DETECTED Final   Cryptosporidium NOT DETECTED NOT DETECTED Final   Cyclospora cayetanensis NOT DETECTED NOT DETECTED Final   Entamoeba histolytica NOT DETECTED NOT DETECTED Final   Giardia lamblia NOT DETECTED NOT DETECTED Final   Adenovirus F40/41 NOT DETECTED NOT DETECTED Final   Astrovirus NOT DETECTED NOT DETECTED Final   Norovirus GI/GII NOT DETECTED NOT DETECTED Final   Rotavirus A NOT DETECTED NOT DETECTED Final   Sapovirus (I, II, IV, and V) NOT DETECTED NOT DETECTED Final    Comment: Performed at [facility], [address] [place], [place] [address]  C Difficile Quick Screen w PCR reflex     Status: None   Collection Time: 01/17/20 10:55 AM   Specimen: STOOL  Result Value Ref Range Status   C Diff antigen NEGATIVE NEGATIVE Final   C Diff toxin NEGATIVE NEGATIVE Final   C Diff interpretation No C. difficile detected.  Final    Comment: Performed at [facility], [address] [place], [place] [address]  Resp Panel by RT-PCR (Flu A&B, Covid) Nasopharyngeal Swab     Status:  None   Collection Time: 01/19/20 11:40 AM   Specimen: Nasopharyngeal Swab; Nasopharyngeal(NP) swabs in vial transport medium  Result Value Ref Range Status   SARS Coronavirus 2 by RT PCR NEGATIVE NEGATIVE Final    Comment: (NOTE) SARS-CoV-2 target nucleic acids are NOT DETECTED.  The SARS-CoV-2 RNA is generally detectable in upper respiratory specimens during the acute phase of infection. The lowest concentration of SARS-CoV-2 viral copies this assay can detect is 138 copies/mL. A negative result does not preclude SARS-Cov-2 infection and should not be used as the sole basis for treatment or other patient management decisions. A negative result may occur with  improper specimen collection/handling, submission of specimen other than nasopharyngeal swab, presence of viral mutation(s) within the areas targeted by this assay, and inadequate number of viral copies(<138 copies/mL). A negative result must be combined with clinical observations, patient history, and epidemiological information. The expected result is Negative.  Fact Sheet for Patients:  EntrepreneurPulse.com.au  Fact Sheet for Healthcare Providers:  IncredibleEmployment.be  This test is no t yet approved or cleared by the [place] and  has been authorized for  detection and/or diagnosis of SARS-CoV-2 by FDA under an Emergency Use Authorization (EUA). This EUA will remain  in effect (meaning this test can be used) for the duration of the COVID-19 declaration under Section 564(b)(1) of the Act, 21 U.S.C.section 360bbb-3(b)(1), unless the authorization is terminated  or revoked sooner.       Influenza A by PCR NEGATIVE NEGATIVE Final   Influenza B by PCR NEGATIVE NEGATIVE Final    Comment: (NOTE) The Xpert Xpress SARS-CoV-2/FLU/RSV plus assay is intended as an aid in the diagnosis of influenza from Nasopharyngeal swab specimens and should not be used as a sole basis for  treatment. Nasal washings and aspirates are unacceptable for Xpert Xpress SARS-CoV-2/FLU/RSV testing.  Fact Sheet for Patients: EntrepreneurPulse.com.au  Fact Sheet for Healthcare Providers: IncredibleEmployment.be  This test is not yet approved or cleared by the [place] FDA and has been authorized for detection and/or diagnosis of SARS-CoV-2 by FDA under an Emergency Use Authorization (EUA). This EUA will remain in effect (meaning this test can be used) for the duration of the COVID-19 declaration under Section 564(b)(1) of the Act, 21 U.S.C. section 360bbb-3(b)(1), unless the authorization is terminated or revoked.  Performed at [facility], [address], [place], [place] [address]       Radiology Studies: CT ABDOMEN PELVIS WO CONTRAST  Result Date: 01/17/2020 CLINICAL DATA:  Abdominal pain, acute, nonlocalized. Diarrhea, generalized malaise, labs indicating worsening renal function. EXAM: CT ABDOMEN AND PELVIS WITHOUT CONTRAST TECHNIQUE: Multidetector CT imaging of the abdomen and pelvis was performed following the standard protocol without IV contrast. COMPARISON:  CT abdomen pelvis 08/17/2019 FINDINGS: Lower chest: Hyperdense myocardium compared to the cardiac chambers likely representative of anemia. Coronary artery calcifications. Possible trace mitral annular calcification. Persistent trace left pleural effusion. Otherwise no acute abnormality. Hepatobiliary: The hepatic parenchyma is diffusely hypodense compared to the splenic parenchyma consistent with fatty infiltration. No focal liver abnormality. Calcified gallstones within the gallbladder lumen. Otherwise no gallbladder wall thickening or pericholecystic fluid. No biliary dilatation. Pancreas: No focal lesion. Normal pancreatic contour. No surrounding inflammatory changes. No main pancreatic ductal dilatation. Spleen: Normal in size without focal abnormality.  Adrenals/Urinary Tract: No adrenal nodule bilaterally. Bilateral renal cortical scarring. No nephrolithiasis, no hydronephrosis, and no contour-deforming renal mass. No ureterolithiasis or hydroureter. The urinary bladder is distended with urine. There is mild urinary bladder wall thickening and trace perivesicular fat stranding. A Foley catheter is noted to terminate within the lumen of the urinary bladder. Stomach/Bowel: PO contrast reaches the distal transverse colon. Stomach is within normal limits. No evidence of bowel wall thickening or dilatation. Appendix appears normal. Vascular/Lymphatic: No abdominal aorta or iliac aneurysm. Mild atherosclerotic plaque of the aorta and its branches. No abdominal, pelvic, or inguinal lymphadenopathy. Redemonstration of hepatoduodenal nodal calcifications. Reproductive: Uterus and bilateral adnexa are unremarkable. Other: No intraperitoneal free fluid. No intraperitoneal free gas. No organized fluid collection. Musculoskeletal: No abdominal wall hernia or abnormality. No suspicious lytic or blastic osseous lesions. Slightly heterogeneous osseous structures could be due to chronic renal disease. No acute displaced fracture. 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 Multilevel degenerative changes of the spine. IMPRESSION: 1. Foley catheter iwithin an incompletely decompressed urinary bladder. Mild urinary bladder wall thickening and trace perivesicular fat stranding could represent infection. Correlate with urinalysis. 2. Cholelithiasis with no acute cholecystitis. 3. Hepatic steatosis. 4. Stable trace left pleural effusion. 5.  Aortic Atherosclerosis (ICD10-I70.0). Electronically Signed   By: Clelia Croft.D.  On: 01/17/2020 20:59    Scheduled Meds:  benztropine  1 mg Oral BID   Chlorhexidine Gluconate Cloth  6 each Topical Daily   clopidogrel  75 mg Oral Daily   collagenase   Topical Daily   Gerhardt's butt cream    Topical BID   insulin aspart  0-20 Units Subcutaneous TID WC   insulin aspart  0-5 Units Subcutaneous QHS   lactulose  300 mL Rectal Once   perphenazine  1 mg Oral BID   Continuous Infusions:  sodium chloride 100 mL/hr at 01/17/20 0626   cefTRIAXone (ROCEPHIN)  IV 1 g (01/19/20 1147)     LOS: 2 days   Time spent: 29 minutes   Darliss Cheney, MD [facility]  01/19/2020, 1:31 PM   To contact the attending provider between 7A-7P or the covering provider during after hours 7P-7A, please log into the web site [email or web address].

## 2020-01-19 NOTE — TOC Progression Note (Signed)
Transition of Care [facility]) - Progression Note    Patient Details  Name: Renny Gunnarson MRN: [id number] Date of Birth: March 01, 1950  Transition of Care [facility]) CM/SW Contact  Izola Price, RN Phone Number: 01/19/2020, 11:06 AM  Clinical Narrative:   12/12 1415 Update:   Discharge pending 12/13 now Covid test result pending.   Spoke with patients regarding her concerns and questions as did provider.  Son lives out of state and she feels very alone and helpless. Simmie Davies RN CM 12/12 1110 am:    Possible discharge from [facility] and transfer back to [facility] where patient was a current resident.   DOA contacted, Monia Pouch, and requested documents faxed to facility.   Pt will need a new Covid test which was relayed to Unit RN and provider.   Provider to talk to patient again as patient has discharge destination concerns.   Secure chat messaged unit RN with hand off information and will deliver document package and arrange transport if orders actually written.   Notified Mickel Baas, hand off nurse, at [facility] of pending transfer to Rm #[id number].   Will Monitor providers orders for pending discharge or any other TOC needs.  Simmie Davies RN CM         Expected Discharge Plan and Services                           DME Arranged: N/A           HH Agency: NA         Social Determinants of Health (SDOH) Interventions    Readmission Risk Interventions No flowsheet data found.

## 2020-01-20 LAB — BASIC METABOLIC PANEL
Anion gap: 13 (ref 5–15)
BUN: 81 mg/dL — ABNORMAL HIGH (ref 8–23)
CO2: 20 mmol/L — ABNORMAL LOW (ref 22–32)
Calcium: 7.5 mg/dL — ABNORMAL LOW (ref 8.9–10.3)
Chloride: 100 mmol/L (ref 98–111)
Creatinine, Ser: 3.05 mg/dL — ABNORMAL HIGH (ref 0.44–1.00)
GFR, Estimated: 16 mL/min — ABNORMAL LOW (ref >60)
Glucose, Bld: 142 mg/dL — ABNORMAL HIGH (ref 70–99)
Potassium: 4.6 mmol/L (ref 3.5–5.1)
Sodium: 133 mmol/L — ABNORMAL LOW (ref 135–145)

## 2020-01-20 LAB — CBC WITH DIFFERENTIAL/PLATELET
Abs Immature Granulocytes: 0.32 10*3/uL — ABNORMAL HIGH (ref 0.00–0.07)
Basophils Absolute: 0.1 10*3/uL (ref 0.0–0.1)
Basophils Relative: 1 %
Eosinophils Absolute: 0.5 10*3/uL (ref 0.0–0.5)
Eosinophils Relative: 4 %
HCT: 25.1 % — ABNORMAL LOW (ref 36.0–46.0)
Hemoglobin: 8 g/dL — ABNORMAL LOW (ref 12.0–15.0)
Immature Granulocytes: 3 %
Lymphocytes Relative: 21 %
Lymphs Abs: 2.2 10*3/uL (ref 0.7–4.0)
MCH: 30.4 pg (ref 26.0–34.0)
MCHC: 31.9 g/dL (ref 30.0–36.0)
MCV: 95.4 fL (ref 80.0–100.0)
Monocytes Absolute: 1 10*3/uL (ref 0.1–1.0)
Monocytes Relative: 10 %
Neutro Abs: 6.2 10*3/uL (ref 1.7–7.7)
Neutrophils Relative %: 61 %
Platelets: 379 10*3/uL (ref 150–400)
RBC: 2.63 MIL/uL — ABNORMAL LOW (ref 3.87–5.11)
RDW: 17.4 % — ABNORMAL HIGH (ref 11.5–15.5)
WBC: 10.2 10*3/uL (ref 4.0–10.5)
nRBC: 0 % (ref 0.0–0.2)

## 2020-01-20 LAB — GLUCOSE, CAPILLARY
Glucose-Capillary: 125 mg/dL — ABNORMAL HIGH (ref 70–99)
Glucose-Capillary: 251 mg/dL — ABNORMAL HIGH (ref 70–99)

## 2020-01-20 MED ORDER — HYDROCODONE-ACETAMINOPHEN 5-325 MG PO TABS: 1.0000 | ORAL_TABLET | Freq: Four times a day (QID) | ORAL | 0 refills | Status: DC | PRN

## 2020-01-20 MED ORDER — AMOXICILLIN 500 MG PO CAPS: 500.0000 mg | ORAL_CAPSULE | Freq: Two times a day (BID) | ORAL | 0 refills | Status: AC

## 2020-01-20 NOTE — Care Management Important Message (Signed)
Important Message  Patient Details  Name: Gray Maugeri MRN: [id number] Date of Birth: 25-Nov-1950   Medicare Important Message Given:  Yes     Juliann Pulse A Kleigh Hoelzer 01/20/2020, 10:10 AM

## 2020-01-20 NOTE — NC FL2 (Signed)
[place] LEVEL OF CARE SCREENING TOOL     IDENTIFICATION  Patient Name: Gabrielle [name] Birthdate: November 16, 1950 Sex: female Admission Date (Current Location): 01/17/2020  [facility] and [place] Number:  Engineering geologist and Address:  [facility], [address], [place], [place] [address]      Provider Number: [id number]  Attending Physician Name and Address:  Darliss Cheney, MD  Relative Name and Phone Number:  Alfonse Flavors [phone]  / [phone]    Current Level of Care: Hospital Recommended Level of Care: [place] Prior Approval Number:    Date Approved/Denied:   PASRR Number: [id number]  Discharge Plan: SNF    Current Diagnoses: Patient Active Problem List   Diagnosis Date Noted   Hyponatremia 01/17/2020   Anemia of chronic kidney failure, stage 4 (severe) ([place]) 01/17/2020   Diarrhea 01/17/2020   Sacral decubitus ulcer 01/17/2020   Iron deficiency anemia 10/21/2019   COPD with chronic bronchitis ([place]) 08/14/2019   OSA (obstructive sleep apnea) 08/14/2019   Obesity, Class III, BMI 40-49.9 (morbid obesity) ([place]) 08/14/2019   UTI (urinary tract infection) 08/14/2019   Aphasia 08/13/2019   Basal cell carcinoma of glabella 03/04/2019   Cellulitis 04/29/2016   Acute kidney injury superimposed on CKD ([place]) 10/02/2015   Diabetes ([place]) 10/01/2015   SOB (shortness of breath) 10/01/2015   AKI (acute kidney injury) ([place]) 10/01/2015   Hyperkalemia 07/21/2015   Uncontrolled hypertension 07/21/2015    Orientation RESPIRATION BLADDER Height & Weight     Self,Time,Situation,Place  O2 Incontinent,External catheter Weight: (!) 440 lb 14.7 oz (200 kg) Height:  5\' 6"  (167.6 cm)  BEHAVIORAL SYMPTOMS/MOOD NEUROLOGICAL BOWEL NUTRITION STATUS      Continent Diet (full liquid)  AMBULATORY STATUS COMMUNICATION OF NEEDS Skin   Extensive Assist Verbally Bruising (stage 2 thigh, ischial tuberisoty  stage 3, venous statsis ulcer pretibial left)                       Personal Care Assistance Level of Assistance  Bathing,Feeding,Dressing Bathing Assistance: Maximum assistance Feeding assistance: Limited assistance Dressing Assistance: Maximum assistance     Functional Limitations Info             SPECIAL CARE FACTORS FREQUENCY                       Contractures      Additional Factors Info  Code Status,Allergies Code Status Info: full code Allergies Info: lasix, ultram, erythromycin, plasticized base, tertracyclines & related           Current Medications (01/20/2020):  This is the current hospital active medication list Current Facility-Administered Medications  Medication Dose Route Frequency Provider Last Rate Last Admin   0.9 %  sodium chloride infusion   Intravenous Continuous Athena Masse, MD 100 mL/hr at 01/17/20 0626 New Bag at 01/17/20 0626   acetaminophen (TYLENOL) tablet 650 mg  650 mg Oral Q6H PRN Athena Masse, MD   650 mg at 01/17/20 1303   Or   acetaminophen (TYLENOL) suppository 650 mg  650 mg Rectal Q6H PRN Athena Masse, MD       benztropine (COGENTIN) tablet 1 mg  1 mg Oral BID Darliss Cheney, MD   1 mg at 01/19/20 2100   cefTRIAXone (ROCEPHIN) 1 g in sodium chloride 0.9 % 100 mL IVPB  1 g Intravenous Q24H Darliss Cheney, MD 200 mL/hr at 01/19/20 1147 1 g at 01/19/20 1147  Chlorhexidine Gluconate Cloth 2 % PADS 6 each  6 each Topical Daily Darliss Cheney, MD   6 each at 01/19/20 0301   clopidogrel (PLAVIX) tablet 75 mg  75 mg Oral Daily Darliss Cheney, MD   75 mg at 01/19/20 3143   collagenase (SANTYL) ointment   Topical Daily Darliss Cheney, MD   Given at 01/19/20 0820   Gerhardt's butt cream   Topical BID Darliss Cheney, MD   Given at 01/19/20 2100   insulin aspart (novoLOG) injection 0-20 Units  0-20 Units Subcutaneous TID WC Judd Gaudier V, MD   4 Units at 01/19/20 1146   insulin aspart (novoLOG) injection 0-5 Units  0-5  Units Subcutaneous QHS Athena Masse, MD       ondansetron [facility]) tablet 4 mg  4 mg Oral Q6H PRN Athena Masse, MD       Or   ondansetron [facility]) injection 4 mg  4 mg Intravenous Q6H PRN Athena Masse, MD       perphenazine (TRILAFON) tablet 1 mg  1 mg Oral BID Darliss Cheney, MD   1 mg at 01/19/20 2100     Discharge Medications: Please see discharge summary for a list of discharge medications.  Relevant Imaging Results:  Relevant Lab Results:   Additional Information SSN: [id number]  [place], LCSW

## 2020-01-20 NOTE — Discharge Instructions (Signed)

## 2020-01-20 NOTE — TOC Transition Note (Addendum)
Transition of Care [facility]) - CM/SW Discharge Note   Patient Details  Name: Gabrielle [name] MRN: [id number] Date of Birth: 07-06-1950  Transition of Care [facility]) CM/SW Contact:  Magnus Ivan, LCSW Phone Number: 01/20/2020, 9:26 AM   Clinical Narrative:   Patient to discharge back to [facility] where she is a long term resident today. COVID test completed yesterday and is negative. CSW spoke with patient and patient's sister who is at bedside. Both are agreeable to patient discharging back to [facility] today. CSW spoke with Lavella Lemons at [facility] who confirmed patient can return there today. CSW updated MD and RN. Asked RN to call report once DC Summary is completed. CSW placed new EMS Paperwork in Discharge Packet. CSW will call [facility] when notified by RN that patient is ready for DC.  12:35- CSW informed by RN Jun that patient is ready for EMS. EMS arranged with [facility], patient is first on the list. Updated RN.   Final next level of care: [place]     Patient Goals and CMS Choice     Choice offered to / list presented to :  (Pt. is returning to this facility as a long term resident.)  Discharge Placement              Patient chooses bed at: [facility] (Returning Resident.)        Discharge Plan and Services                DME Arranged: N/A           HH Agency: NA        Social Determinants of Health (SDOH) Interventions     Readmission Risk Interventions No flowsheet data found.

## 2020-01-20 NOTE — Discharge Summary (Signed)
Physician Discharge Summary  Gabrielle [name] BJS:[id number] DOB: 16-Jan-1951 DOA: 01/17/2020  PCP: System, Provider Not In  Admit date: 01/17/2020 Discharge date: 01/20/2020  Admitted From: SNF Disposition: SNF  Recommendations for Outpatient Follow-up:  1. Follow up with PCP in 1-2 weeks 2. Please obtain BMP/CBC in one week 3. Please follow up with your PCP on the following pending results: Unresulted Labs (From admission, onward)          Start     Ordered   01/18/20 0500  CBC with Differential/Platelet  Daily,   STAT      01/17/20 0821           Home Health: None Equipment/Devices: None  Discharge Condition: Stable CODE STATUS: Full code Diet recommendation: Cardiac/renal  Subjective: Seen and examined.  Sister at the bedside.  Feels much better today.  No specific complaint.  Brief/Interim Summary: Gabrielle [name] a 69 y.o.femalewith medical history significant forCOPD, chronic respiratory failure on home O2 at 2 L, OSA, systolic heart failure, last EF 30 to 35%, CKD4, type 2 diabetes, HTN, morbid obesity, bedbound at baseline, who was sent from the nursing home for evaluation of abnormal labs. Patient had been having diarrhea and generalized malaise and blood work was concerning for worsening renal function. Patient had recently been on Keflex to treat cellulitis of buttocks.She has had no fever or chills. Has had no cough or shortness of breath beyond baseline   ED Course:On arrival in the ED, BP 114/63, pulse 107, temp 98.2, O2 sat 100% on O2 at home flow rate. Blood work significant for creatinine of 4.59, up from baseline around 2.15 in the past 6 months, with normal potassium and bicarb, sodium 126 hemoglobin 9.4, around her baseline for CKD. WBC 11,000. Covid and flu test negative.Stool tests pending EKG as reviewed by me :Sinus tachycardia at 105 with no acute ST-T wave changes Imaging: Chest x-ray clear Patient was started on IV hydration.  Hospitalist consulted for admission.  Patient was subsequently admitted to hospital service with multiple problems including dehydration, acute on chronic kidney disease stage IV, hypovolemic hyponatremia as well as diarrhea.  She was tested negative for GI pathogen panel as well as C. difficile.  Her diarrhea improved.  She was started on IV fluids.  Subsequently she was diagnosed with UTI and was started on IV Rocephin.  Her urine culture is now growing gram-negative rods, final identification and sensitivities pending however patient clinically has improved.  Her sodium has improved and so is her renal function which is very close to her baseline now.  Patient's hydralazine was held and her blood pressure remained stable.  Patient is now stable for discharge.  He is being discharged back to her SNF.  We are holding her hydralazine for the reasons mentioned above and she is going to be prescribed 4 more days of oral amoxicillin to complete the course for UTI.  Patient and her sister in agreement with the plan.  Discharge Diagnoses:  Principal Problem:   Acute kidney injury superimposed on CKD ([place]) Active Problems:   AKI (acute kidney injury) ([place])   COPD with chronic bronchitis (HCC)   OSA (obstructive sleep apnea)   Obesity, Class III, BMI 40-49.9 (morbid obesity) (HCC)   Hyponatremia   Anemia of chronic kidney failure, stage 4 (severe) (HCC)   Diarrhea   Sacral decubitus ulcer    Discharge Instructions   Allergies as of 01/20/2020      Reactions   Lasix [furosemide]    Numbness  and tingling of the face    Ultram [tramadol] Other (See Comments)   Pt reports, "makes my head feel funny."   Erythromycin Rash   Plasticized Base [plastibase] Rash   Tetracyclines & Related Rash      Medication List    STOP taking these medications   hydrALAZINE 25 MG tablet Commonly known as: APRESOLINE     TAKE these medications   acetaminophen 500 MG tablet Commonly known as: TYLENOL Take  500 mg by mouth 3 (three) times daily.   amoxicillin 500 MG capsule Commonly known as: AMOXIL Take 1 capsule (500 mg total) by mouth 2 (two) times daily for 4 days.   benztropine 1 MG tablet Commonly known as: COGENTIN Take 1 mg by mouth 2 (two) times daily.   bisacodyl 5 MG EC tablet Commonly known as: DULCOLAX Take 10 mg by mouth daily as needed for moderate constipation or severe constipation.   busPIRone 10 MG tablet Commonly known as: BUSPAR Take 10 mg by mouth 3 (three) times daily.   Bydureon BCise 2 MG/0.85ML Auij Generic drug: Exenatide ER Inject 2 mg into the skin every Wednesday.   clobetasol cream 0.05 % Commonly known as: TEMOVATE Apply 1 application topically in the morning and at bedtime. (apply to scalp, legs and arms)   clopidogrel 75 MG tablet Commonly known as: Plavix Take 1 tablet (75 mg total) by mouth daily.   docusate sodium 100 MG capsule Commonly known as: COLACE Take 100 mg by mouth daily.   docusate sodium 100 MG capsule Commonly known as: COLACE Take 100 mg by mouth every 8 (eight) hours as needed for mild constipation or moderate constipation.   empagliflozin 25 MG Tabs tablet Commonly known as: Jardiance Take 1 tablet (25 mg total) by mouth daily before breakfast.   ethacrynic acid 25 MG tablet Commonly known as: EDECRIN Take 3 tablets (75 mg total) by mouth 2 (two) times daily.   fluticasone 50 MCG/ACT nasal spray Commonly known as: FLONASE Place 1 spray into both nostrils every 12 (twelve) hours as needed for allergies.   HumaLOG 100 UNIT/ML cartridge Generic drug: insulin lispro Inject 5 Units into the skin See admin instructions. Inject 5u under the skin three times daily with meals with sliding scale   HYDROcodone-acetaminophen 5-325 MG tablet Commonly known as: NORCO/VICODIN Take 1 tablet by mouth every 6 (six) hours as needed for up to 10 doses for moderate pain or severe pain.   insulin glargine 100 UNIT/ML  injection Commonly known as: LANTUS Inject 0.85 mLs (85 Units total) into the skin daily. What changed:   how much to take  when to take this   ipratropium-albuterol 0.5-2.5 (3) MG/3ML Soln Commonly known as: DUONEB Take 3 mLs by nebulization every 4 (four) hours as needed (congestion).   melatonin 1 MG Tabs tablet Take 3 mg by mouth at bedtime.   nabumetone 500 MG tablet Commonly known as: RELAFEN Take 500 mg by mouth daily.   nitroGLYCERIN 0.4 MG SL tablet Commonly known as: NITROSTAT Place 0.4 mg under the tongue every 5 (five) minutes as needed for chest pain.   nystatin cream Commonly known as: MYCOSTATIN Apply 1 application topically 2 (two) times daily. (apply to perennial area)   nystatin powder Commonly known as: MYCOSTATIN/NYSTOP Apply 1 application topically 2 (two) times daily. (apply to perennial area)   omeprazole 20 MG capsule Commonly known as: PRILOSEC Take 20 mg by mouth every other day.   perphenazine 2 MG tablet Commonly known  as: TRILAFON Take 1 mg by mouth 2 (two) times daily.   polyethylene glycol 17 g packet Commonly known as: MIRALAX / GLYCOLAX Take 17 g by mouth daily.   sodium chloride 0.65 % Soln nasal spray Commonly known as: OCEAN Place 2 sprays into both nostrils every 8 (eight) hours as needed for congestion.   vitamin B-12 1000 MCG tablet Commonly known as: CYANOCOBALAMIN Take 1,000 mcg by mouth every Monday, Wednesday, and Friday.   Vitamin D3 25 MCG (1000 UT) Caps Take 1,000 Units by mouth daily.       Allergies  Allergen Reactions  . Lasix [Furosemide]     Numbness and tingling of the face   . Ultram [Tramadol] Other (See Comments)    Pt reports, "makes my head feel funny."  . Erythromycin Rash  . Plasticized Base [Plastibase] Rash  . Tetracyclines & Related Rash    Consultations: None   Procedures/Studies: CT ABDOMEN PELVIS WO CONTRAST  Result Date: 01/17/2020 CLINICAL DATA:  Abdominal pain, acute,  nonlocalized. Diarrhea, generalized malaise, labs indicating worsening renal function. EXAM: CT ABDOMEN AND PELVIS WITHOUT CONTRAST TECHNIQUE: Multidetector CT imaging of the abdomen and pelvis was performed following the standard protocol without IV contrast. COMPARISON:  CT abdomen pelvis 08/17/2019 FINDINGS: Lower chest: Hyperdense myocardium compared to the cardiac chambers likely representative of anemia. Coronary artery calcifications. Possible trace mitral annular calcification. Persistent trace left pleural effusion. Otherwise no acute abnormality. Hepatobiliary: The hepatic parenchyma is diffusely hypodense compared to the splenic parenchyma consistent with fatty infiltration. No focal liver abnormality. Calcified gallstones within the gallbladder lumen. Otherwise no gallbladder wall thickening or pericholecystic fluid. No biliary dilatation. Pancreas: No focal lesion. Normal pancreatic contour. No surrounding inflammatory changes. No main pancreatic ductal dilatation. Spleen: Normal in size without focal abnormality. Adrenals/Urinary Tract: No adrenal nodule bilaterally. Bilateral renal cortical scarring. No nephrolithiasis, no hydronephrosis, and no contour-deforming renal mass. No ureterolithiasis or hydroureter. The urinary bladder is distended with urine. There is mild urinary bladder wall thickening and trace perivesicular fat stranding. A Foley catheter is noted to terminate within the lumen of the urinary bladder. Stomach/Bowel: PO contrast reaches the distal transverse colon. Stomach is within normal limits. No evidence of bowel wall thickening or dilatation. Appendix appears normal. Vascular/Lymphatic: No abdominal aorta or iliac aneurysm. Mild atherosclerotic plaque of the aorta and its branches. No abdominal, pelvic, or inguinal lymphadenopathy. Redemonstration of hepatoduodenal nodal calcifications. Reproductive: Uterus and bilateral adnexa are unremarkable. Other: No intraperitoneal free  fluid. No intraperitoneal free gas. No organized fluid collection. Musculoskeletal: No abdominal wall hernia or abnormality. No suspicious lytic or blastic osseous lesions. Slightly heterogeneous osseous structures could be due to chronic renal disease. No acute displaced fracture. 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 Multilevel degenerative changes of the spine. IMPRESSION: 1. Foley catheter iwithin an incompletely decompressed urinary bladder. Mild urinary bladder wall thickening and trace perivesicular fat stranding could represent infection. Correlate with urinalysis. 2. Cholelithiasis with no acute cholecystitis. 3. Hepatic steatosis. 4. Stable trace left pleural effusion. 5.  Aortic Atherosclerosis (ICD10-I70.0). Electronically Signed   By: Iven Finn M.D.   On: 01/17/2020 20:59   DG Chest Port 1 View  Result Date: 01/17/2020 CLINICAL DATA:  Weakness EXAM: PORTABLE CHEST 1 VIEW COMPARISON:  None. FINDINGS: The heart size and mediastinal contours are within normal limits. Both lungs are clear. The visualized skeletal structures are unremarkable. IMPRESSION: No active disease. Electronically Signed   By: Ebony Cargo.D.  On: 01/17/2020 00:42      Discharge Exam: Vitals:   01/20/20 0352 01/20/20 0740  BP: 115/79 (!) 118/97  Pulse: 84 (!) 109  Resp: 15 18  Temp: 98 F (36.7 C) 98.4 F (36.9 C)  SpO2: 100% 100%   Vitals:   01/19/20 2157 01/20/20 0051 01/20/20 0352 01/20/20 0740  BP: (!) 120/50 116/62 115/79 (!) 118/97  Pulse: 84 80 84 (!) 109  Resp: 16 17 15 18   Temp: 97.8 F (36.6 C) 98.3 F (36.8 C) 98 F (36.7 C) 98.4 F (36.9 C)  TempSrc:      SpO2: 100% 100% 100% 100%  Weight:      Height:        General: Pt is alert, awake, not in acute distress Cardiovascular: RRR, S1/S2 +, no rubs, no gallops Respiratory: CTA bilaterally, no wheezing, no rhonchi Abdominal: Soft, NT, ND, bowel sounds + Extremities: +1 pitting  edema bilateral lower extremity.    The results of significant diagnostics from this hospitalization (including imaging, microbiology, ancillary and laboratory) are listed below for reference.     Microbiology: Recent Results (from the past 240 hour(s))  Resp Panel by RT-PCR (Flu A&B, Covid) Nasopharyngeal Swab     Status: None   Collection Time: 01/16/20 10:33 PM   Specimen: Nasopharyngeal Swab; Nasopharyngeal(NP) swabs in vial transport medium  Result Value Ref Range Status   SARS Coronavirus 2 by RT PCR NEGATIVE NEGATIVE Final    Comment: (NOTE) SARS-CoV-2 target nucleic acids are NOT DETECTED.  The SARS-CoV-2 RNA is generally detectable in upper respiratory specimens during the acute phase of infection. The lowest concentration of SARS-CoV-2 viral copies this assay can detect is 138 copies/mL. A negative result does not preclude SARS-Cov-2 infection and should not be used as the sole basis for treatment or other patient management decisions. A negative result may occur with  improper specimen collection/handling, submission of specimen other than nasopharyngeal swab, presence of viral mutation(s) within the areas targeted by this assay, and inadequate number of viral copies(<138 copies/mL). A negative result must be combined with clinical observations, patient history, and epidemiological information. The expected result is Negative.  Fact Sheet for Patients:  EntrepreneurPulse.com.au  Fact Sheet for Healthcare Providers:  IncredibleEmployment.be  This test is no t yet approved or cleared by the [place] FDA and  has been authorized for detection and/or diagnosis of SARS-CoV-2 by FDA under an Emergency Use Authorization (EUA). This EUA will remain  in effect (meaning this test can be used) for the duration of the COVID-19 declaration under Section 564(b)(1) of the Act, 21 U.S.C.section 360bbb-3(b)(1), unless the authorization is  terminated  or revoked sooner.       Influenza A by PCR NEGATIVE NEGATIVE Final   Influenza B by PCR NEGATIVE NEGATIVE Final    Comment: (NOTE) The Xpert Xpress SARS-CoV-2/FLU/RSV plus assay is intended as an aid in the diagnosis of influenza from Nasopharyngeal swab specimens and should not be used as a sole basis for treatment. Nasal washings and aspirates are unacceptable for Xpert Xpress SARS-CoV-2/FLU/RSV testing.  Fact Sheet for Patients: EntrepreneurPulse.com.au  Fact Sheet for Healthcare Providers: IncredibleEmployment.be  This test is not yet approved or cleared by the [place] FDA and has been authorized for detection and/or diagnosis of SARS-CoV-2 by FDA under an Emergency Use Authorization (EUA). This EUA will remain in effect (meaning this test can be used) for the duration of the COVID-19 declaration under Section 564(b)(1) of the Act, 21 U.S.C. section 360bbb-3(b)(1),  unless the authorization is terminated or revoked.  Performed at [facility], [address] [place], [place] [address]   Culture, blood (routine x 2)     Status: None (Preliminary result)   Collection Time: 01/17/20 12:30 AM   Specimen: BLOOD  Result Value Ref Range Status   Specimen Description BLOOD RIGHT ANTECUBITAL  Final   Special Requests   Final    BOTTLES DRAWN AEROBIC AND ANAEROBIC Blood Culture adequate volume   Culture   Final    NO GROWTH 3 DAYS Performed at [facility], [address], [place], [place] [address]    Report Status PENDING  Incomplete  Culture, blood (routine x 2)     Status: None (Preliminary result)   Collection Time: 01/17/20 12:30 AM   Specimen: BLOOD  Result Value Ref Range Status   Specimen Description   Final    BLOOD Blood Culture results may not be optimal due to an excessive volume of blood received in culture bottles   Special Requests   Final    BOTTLES DRAWN AEROBIC AND ANAEROBIC LEFT  ANTECUBITAL   Culture   Final    NO GROWTH 3 DAYS Performed at [facility], [address], [place], [place] [address]    Report Status PENDING  Incomplete  C Difficile Quick Screen w PCR reflex     Status: None   Collection Time: 01/17/20 12:30 AM   Specimen: STOOL  Result Value Ref Range Status   C Diff antigen NEGATIVE NEGATIVE Final   C Diff toxin NEGATIVE NEGATIVE Final   C Diff interpretation No C. difficile detected.  Final    Comment: Performed at [facility], [address] [place],  [address]  Gastrointestinal Panel by PCR , Stool     Status: None   Collection Time: 01/17/20 12:30 AM   Specimen: Stool  Result Value Ref Range Status   Campylobacter species NOT DETECTED NOT DETECTED Final   Plesimonas shigelloides NOT DETECTED NOT DETECTED Final   Salmonella species NOT DETECTED NOT DETECTED Final   Yersinia enterocolitica NOT DETECTED NOT DETECTED Final   Vibrio species NOT DETECTED NOT DETECTED Final   Vibrio cholerae NOT DETECTED NOT DETECTED Final   Enteroaggregative E coli (EAEC) NOT DETECTED NOT DETECTED Final   Enteropathogenic E coli (EPEC) NOT DETECTED NOT DETECTED Final   Enterotoxigenic E coli (ETEC) NOT DETECTED NOT DETECTED Final   Shiga like toxin producing E coli (STEC) NOT DETECTED NOT DETECTED Final   Shigella/Enteroinvasive E coli (EIEC) NOT DETECTED NOT DETECTED Final   Cryptosporidium NOT DETECTED NOT DETECTED Final   Cyclospora cayetanensis NOT DETECTED NOT DETECTED Final   Entamoeba histolytica NOT DETECTED NOT DETECTED Final   Giardia lamblia NOT DETECTED NOT DETECTED Final   Adenovirus F40/41 NOT DETECTED NOT DETECTED Final   Astrovirus NOT DETECTED NOT DETECTED Final   Norovirus GI/GII NOT DETECTED NOT DETECTED Final   Rotavirus A NOT DETECTED NOT DETECTED Final   Sapovirus (I, II, IV, and V) NOT DETECTED NOT DETECTED Final    Comment: Performed at [facility], [address] [place], [place] [address]   C Difficile Quick Screen w PCR reflex     Status: None   Collection Time: 01/17/20 10:55 AM   Specimen: STOOL  Result Value Ref Range Status   C Diff antigen NEGATIVE NEGATIVE Final   C Diff toxin NEGATIVE NEGATIVE Final   C Diff interpretation No C. difficile detected.  Final  Comment: Performed at [facility], [address] [place], [place] [address]  Urine Culture     Status: Abnormal (Preliminary result)   Collection Time: 01/17/20  3:18 PM   Specimen: Urine, Clean Catch  Result Value Ref Range Status   Specimen Description   Final    URINE, CLEAN CATCH Performed at [facility], [address], [place], [place] [address]    Special Requests   Final    NONE Performed at [facility], [address], [place], [place] [address]    Culture (A)  Final    >=100,000 COLONIES/mL Lonell Grandchild NEGATIVE RODS SUSCEPTIBILITIES TO FOLLOW Performed at [facility], [place] [address] [place], [place] [address]    Report Status PENDING  Incomplete  Resp Panel by RT-PCR (Flu A&B, Covid) Nasopharyngeal Swab     Status: None   Collection Time: 01/19/20 11:40 AM   Specimen: Nasopharyngeal Swab; Nasopharyngeal(NP) swabs in vial transport medium  Result Value Ref Range Status   SARS Coronavirus 2 by RT PCR NEGATIVE NEGATIVE Final    Comment: (NOTE) SARS-CoV-2 target nucleic acids are NOT DETECTED.  The SARS-CoV-2 RNA is generally detectable in upper respiratory specimens during the acute phase of infection. The lowest concentration of SARS-CoV-2 viral copies this assay can detect is 138 copies/mL. A negative result does not preclude SARS-Cov-2 infection and should not be used as the sole basis for treatment or other patient management decisions. A negative result may occur with  improper specimen collection/handling, submission of specimen other than nasopharyngeal swab, presence of viral mutation(s) within the areas targeted by this assay, and inadequate  number of viral copies(<138 copies/mL). A negative result must be combined with clinical observations, patient history, and epidemiological information. The expected result is Negative.  Fact Sheet for Patients:  EntrepreneurPulse.com.au  Fact Sheet for Healthcare Providers:  IncredibleEmployment.be  This test is no t yet approved or cleared by the [place] FDA and  has been authorized for detection and/or diagnosis of SARS-CoV-2 by FDA under an Emergency Use Authorization (EUA). This EUA will remain  in effect (meaning this test can be used) for the duration of the COVID-19 declaration under Section 564(b)(1) of the Act, 21 U.S.C.section 360bbb-3(b)(1), unless the authorization is terminated  or revoked sooner.       Influenza A by PCR NEGATIVE NEGATIVE Final   Influenza B by PCR NEGATIVE NEGATIVE Final    Comment: (NOTE) The Xpert Xpress SARS-CoV-2/FLU/RSV plus assay is intended as an aid in the diagnosis of influenza from Nasopharyngeal swab specimens and should not be used as a sole basis for treatment. Nasal washings and aspirates are unacceptable for Xpert Xpress SARS-CoV-2/FLU/RSV testing.  Fact Sheet for Patients: EntrepreneurPulse.com.au  Fact Sheet for Healthcare Providers: IncredibleEmployment.be  This test is not yet approved or cleared by the [place] FDA and has been authorized for detection and/or diagnosis of SARS-CoV-2 by FDA under an Emergency Use Authorization (EUA). This EUA will remain in effect (meaning this test can be used) for the duration of the COVID-19 declaration under Section 564(b)(1) of the Act, 21 U.S.C. section 360bbb-3(b)(1), unless the authorization is terminated or revoked.  Performed at [facility], [address] [place], [place] [address]      Labs: BNP (last 3 results) Recent Labs    01/16/20 2233  BNP 408.1*   Basic Metabolic  Panel: Recent Labs  Lab 01/16/20 2233 01/17/20 0432 01/17/20 1326 01/18/20 0336 01/18/20 1341 01/18/20 1942 01/19/20 0425 01/20/20  0553  NA 126* 124*   < > 127* 127* 130* 131* 133*  K 5.1 5.0  --  5.2*  --   --  4.4 4.6  CL 89* 88*  --  93*  --   --  96* 100  CO2 22 20*  --  21*  --   --  21* 20*  GLUCOSE 194* 214*  --  103*  --   --  126* 142*  BUN 95* 97*  --  92*  --   --  85* 81*  CREATININE 4.59* 4.72*  --  3.83*  --   --  3.12* 3.05*  CALCIUM 8.1* 7.9*  --  7.3*  --   --  7.5* 7.5*   < > = values in this interval not displayed.   Liver Function Tests: Recent Labs  Lab 01/16/20 2233  AST 15  ALT 11  ALKPHOS 114  BILITOT 1.0  PROT 7.0  ALBUMIN 1.6*   No results for input(s): LIPASE, AMYLASE in the last 168 hours. No results for input(s): AMMONIA in the last 168 hours. CBC: Recent Labs  Lab 01/16/20 2233 01/17/20 0432 01/18/20 0336 01/19/20 0425 01/20/20 0553  WBC 11.0* 11.3* 13.2* 12.8* 10.2  NEUTROABS 7.4  --  9.6* 8.6* 6.2  HGB 9.4* 9.1* 8.3* 8.6* 8.0*  HCT 28.2* 28.6* 25.7* 26.5* 25.1*  MCV 92.2 94.4 94.1 93.6 95.4  PLT 479* 467* 410* 438* 379   Cardiac Enzymes: No results for input(s): CKTOTAL, CKMB, CKMBINDEX, TROPONINI in the last 168 hours. BNP: Invalid input(s): POCBNP CBG: Recent Labs  Lab 01/19/20 0753 01/19/20 1143 01/19/20 1731 01/19/20 2048 01/20/20 0743  GLUCAP 137* 181* 203* 161* 125*   D-Dimer No results for input(s): DDIMER in the last 72 hours. Hgb A1c No results for input(s): HGBA1C in the last 72 hours. Lipid Profile No results for input(s): CHOL, HDL, LDLCALC, TRIG, CHOLHDL, LDLDIRECT in the last 72 hours. Thyroid function studies No results for input(s): TSH, T4TOTAL, T3FREE, THYROIDAB in the last 72 hours.  Invalid input(s): FREET3 Anemia work up No results for input(s): VITAMINB12, FOLATE, FERRITIN, TIBC, IRON, RETICCTPCT in the last 72 hours. Urinalysis    Component Value Date/Time   COLORURINE YELLOW (A)  01/17/2020 1518   APPEARANCEUR TURBID (A) 01/17/2020 1518   LABSPEC 1.013 01/17/2020 1518   PHURINE 5.0 01/17/2020 1518   GLUCOSEU NEGATIVE 01/17/2020 1518   HGBUR MODERATE (A) 01/17/2020 1518   BILIRUBINUR NEGATIVE 01/17/2020 1518   KETONESUR NEGATIVE 01/17/2020 1518   PROTEINUR 100 (A) 01/17/2020 1518   NITRITE NEGATIVE 01/17/2020 1518   LEUKOCYTESUR LARGE (A) 01/17/2020 1518   Sepsis Labs Invalid input(s): PROCALCITONIN,  WBC,  LACTICIDVEN Microbiology Recent Results (from the past 240 hour(s))  Resp Panel by RT-PCR (Flu A&B, Covid) Nasopharyngeal Swab     Status: None   Collection Time: 01/16/20 10:33 PM   Specimen: Nasopharyngeal Swab; Nasopharyngeal(NP) swabs in vial transport medium  Result Value Ref Range Status   SARS Coronavirus 2 by RT PCR NEGATIVE NEGATIVE Final    Comment: (NOTE) SARS-CoV-2 target nucleic acids are NOT DETECTED.  The SARS-CoV-2 RNA is generally detectable in upper respiratory specimens during the acute phase of infection. The lowest concentration of SARS-CoV-2 viral copies this assay can detect is 138 copies/mL. A negative result does not preclude SARS-Cov-2 infection and should not be used as the sole basis for treatment or other patient management decisions. A negative result may occur with  improper specimen collection/handling, submission of specimen  other than nasopharyngeal swab, presence of viral mutation(s) within the areas targeted by this assay, and inadequate number of viral copies(<138 copies/mL). A negative result must be combined with clinical observations, patient history, and epidemiological information. The expected result is Negative.  Fact Sheet for Patients:  EntrepreneurPulse.com.au  Fact Sheet for Healthcare Providers:  IncredibleEmployment.be  This test is no t yet approved or cleared by the [place] FDA and  has been authorized for detection and/or diagnosis of SARS-CoV-2 by FDA  under an Emergency Use Authorization (EUA). This EUA will remain  in effect (meaning this test can be used) for the duration of the COVID-19 declaration under Section 564(b)(1) of the Act, 21 U.S.C.section 360bbb-3(b)(1), unless the authorization is terminated  or revoked sooner.       Influenza A by PCR NEGATIVE NEGATIVE Final   Influenza B by PCR NEGATIVE NEGATIVE Final    Comment: (NOTE) The Xpert Xpress SARS-CoV-2/FLU/RSV plus assay is intended as an aid in the diagnosis of influenza from Nasopharyngeal swab specimens and should not be used as a sole basis for treatment. Nasal washings and aspirates are unacceptable for Xpert Xpress SARS-CoV-2/FLU/RSV testing.  Fact Sheet for Patients: EntrepreneurPulse.com.au  Fact Sheet for Healthcare Providers: IncredibleEmployment.be  This test is not yet approved or cleared by the [place] FDA and has been authorized for detection and/or diagnosis of SARS-CoV-2 by FDA under an Emergency Use Authorization (EUA). This EUA will remain in effect (meaning this test can be used) for the duration of the COVID-19 declaration under Section 564(b)(1) of the Act, 21 U.S.C. section 360bbb-3(b)(1), unless the authorization is terminated or revoked.  Performed at [facility], [address] [place], [place] [address]   Culture, blood (routine x 2)     Status: None (Preliminary result)   Collection Time: 01/17/20 12:30 AM   Specimen: BLOOD  Result Value Ref Range Status   Specimen Description BLOOD RIGHT ANTECUBITAL  Final   Special Requests   Final    BOTTLES DRAWN AEROBIC AND ANAEROBIC Blood Culture adequate volume   Culture   Final    NO GROWTH 3 DAYS Performed at [facility], [address] [place], [place] [address]    Report Status PENDING  Incomplete  Culture, blood (routine x 2)     Status: None (Preliminary result)   Collection Time: 01/17/20 12:30 AM   Specimen: BLOOD   Result Value Ref Range Status   Specimen Description   Final    BLOOD Blood Culture results may not be optimal due to an excessive volume of blood received in culture bottles   Special Requests   Final    BOTTLES DRAWN AEROBIC AND ANAEROBIC LEFT ANTECUBITAL   Culture   Final    NO GROWTH 3 DAYS Performed at [facility], [address] [place], [place] [address]    Report Status PENDING  Incomplete  C Difficile Quick Screen w PCR reflex     Status: None   Collection Time: 01/17/20 12:30 AM   Specimen: STOOL  Result Value Ref Range Status   C Diff antigen NEGATIVE NEGATIVE Final   C Diff toxin NEGATIVE NEGATIVE Final   C Diff interpretation No C. difficile detected.  Final    Comment: Performed at [facility], [address] [place], [place] [address]  Gastrointestinal Panel by PCR , Stool     Status: None   Collection Time: 01/17/20 12:30 AM   Specimen: Stool  Result Value Ref Range Status  Campylobacter species NOT DETECTED NOT DETECTED Final   Plesimonas shigelloides NOT DETECTED NOT DETECTED Final   Salmonella species NOT DETECTED NOT DETECTED Final   Yersinia enterocolitica NOT DETECTED NOT DETECTED Final   Vibrio species NOT DETECTED NOT DETECTED Final   Vibrio cholerae NOT DETECTED NOT DETECTED Final   Enteroaggregative E coli (EAEC) NOT DETECTED NOT DETECTED Final   Enteropathogenic E coli (EPEC) NOT DETECTED NOT DETECTED Final   Enterotoxigenic E coli (ETEC) NOT DETECTED NOT DETECTED Final   Shiga like toxin producing E coli (STEC) NOT DETECTED NOT DETECTED Final   Shigella/Enteroinvasive E coli (EIEC) NOT DETECTED NOT DETECTED Final   Cryptosporidium NOT DETECTED NOT DETECTED Final   Cyclospora cayetanensis NOT DETECTED NOT DETECTED Final   Entamoeba histolytica NOT DETECTED NOT DETECTED Final   Giardia lamblia NOT DETECTED NOT DETECTED Final   Adenovirus F40/41 NOT DETECTED NOT DETECTED Final   Astrovirus NOT DETECTED NOT DETECTED Final    Norovirus GI/GII NOT DETECTED NOT DETECTED Final   Rotavirus A NOT DETECTED NOT DETECTED Final   Sapovirus (I, II, IV, and V) NOT DETECTED NOT DETECTED Final    Comment: Performed at [facility], [address] [place], [place] [address]  C Difficile Quick Screen w PCR reflex     Status: None   Collection Time: 01/17/20 10:55 AM   Specimen: STOOL  Result Value Ref Range Status   C Diff antigen NEGATIVE NEGATIVE Final   C Diff toxin NEGATIVE NEGATIVE Final   C Diff interpretation No C. difficile detected.  Final    Comment: Performed at [facility], [address] [place], [place] [address]  Urine Culture     Status: Abnormal (Preliminary result)   Collection Time: 01/17/20  3:18 PM   Specimen: Urine, Clean Catch  Result Value Ref Range Status   Specimen Description   Final    URINE, CLEAN CATCH Performed at [facility], [address], [place], [place] [address]    Special Requests   Final    NONE Performed at [facility], [address] [place], [place] [address]    Culture (A)  Final    >=100,000 COLONIES/mL Lonell Grandchild NEGATIVE RODS SUSCEPTIBILITIES TO FOLLOW Performed at [facility], [address], [place], [place] [address]    Report Status PENDING  Incomplete  Resp Panel by RT-PCR (Flu A&B, Covid) Nasopharyngeal Swab     Status: None   Collection Time: 01/19/20 11:40 AM   Specimen: Nasopharyngeal Swab; Nasopharyngeal(NP) swabs in vial transport medium  Result Value Ref Range Status   SARS Coronavirus 2 by RT PCR NEGATIVE NEGATIVE Final    Comment: (NOTE) SARS-CoV-2 target nucleic acids are NOT DETECTED.  The SARS-CoV-2 RNA is generally detectable in upper respiratory specimens during the acute phase of infection. The lowest concentration of SARS-CoV-2 viral copies this assay can detect is 138 copies/mL. A negative result does not preclude SARS-Cov-2 infection and should not be used as the sole basis for treatment  or other patient management decisions. A negative result may occur with  improper specimen collection/handling, submission of specimen other than nasopharyngeal swab, presence of viral mutation(s) within the areas targeted by this assay, and inadequate number of viral copies(<138 copies/mL). A negative result must be combined with clinical observations, patient history, and epidemiological information. The expected result is Negative.  Fact Sheet for Patients:  EntrepreneurPulse.com.au  Fact Sheet for Healthcare Providers:  IncredibleEmployment.be  This test is no t yet approved or  cleared by the [place] and  has been authorized for detection and/or diagnosis of SARS-CoV-2 by FDA under an Emergency Use Authorization (EUA). This EUA will remain  in effect (meaning this test can be used) for the duration of the COVID-19 declaration under Section 564(b)(1) of the Act, 21 U.S.C.section 360bbb-3(b)(1), unless the authorization is terminated  or revoked sooner.       Influenza A by PCR NEGATIVE NEGATIVE Final   Influenza B by PCR NEGATIVE NEGATIVE Final    Comment: (NOTE) The Xpert Xpress SARS-CoV-2/FLU/RSV plus assay is intended as an aid in the diagnosis of influenza from Nasopharyngeal swab specimens and should not be used as a sole basis for treatment. Nasal washings and aspirates are unacceptable for Xpert Xpress SARS-CoV-2/FLU/RSV testing.  Fact Sheet for Patients: EntrepreneurPulse.com.au  Fact Sheet for Healthcare Providers: IncredibleEmployment.be  This test is not yet approved or cleared by the [place] FDA and has been authorized for detection and/or diagnosis of SARS-CoV-2 by FDA under an Emergency Use Authorization (EUA). This EUA will remain in effect (meaning this test can be used) for the duration of the COVID-19 declaration under Section 564(b)(1) of the Act, 21 U.S.C. section  360bbb-3(b)(1), unless the authorization is terminated or revoked.  Performed at [facility], [address], [place], [place] [address]      Time coordinating discharge: Over 30 minutes  SIGNED:   Darliss Cheney, MD  [facility] 01/20/2020, 10:11 AM  If 7PM-7AM, please contact night-coverage [email or web address]

## 2020-01-21 LAB — URINE CULTURE: Culture: >=100000 — AB

## 2020-01-22 LAB — CULTURE, BLOOD (ROUTINE X 2)
Culture: NO GROWTH
Culture: NO GROWTH
Special Requests: ADEQUATE

## 2020-01-25 ENCOUNTER — Encounter: Payer: Self-pay | Admitting: Emergency Medicine

## 2020-01-25 ENCOUNTER — Other Ambulatory Visit: Payer: Self-pay

## 2020-01-25 ENCOUNTER — Emergency Department: Payer: Medicare PPO

## 2020-01-25 ENCOUNTER — Observation Stay
Admission: EM | Admit: 2020-01-25 | Discharge: 2020-01-26 | Disposition: A | Discharge status: Home / Self Care | Payer: Medicare PPO | Attending: Internal Medicine | Admitting: Internal Medicine

## 2020-01-25 DIAGNOSIS — I10 Essential (primary) hypertension: Secondary | ICD-10-CM

## 2020-01-25 DIAGNOSIS — N189 Chronic kidney disease, unspecified: Secondary | ICD-10-CM

## 2020-01-25 DIAGNOSIS — Z794 Long term (current) use of insulin: Secondary | ICD-10-CM | POA: Insufficient documentation

## 2020-01-25 DIAGNOSIS — L89159 Pressure ulcer of sacral region, unspecified stage: Secondary | ICD-10-CM | POA: Diagnosis not present

## 2020-01-25 DIAGNOSIS — Z9981 Dependence on supplemental oxygen: Secondary | ICD-10-CM | POA: Insufficient documentation

## 2020-01-25 DIAGNOSIS — K625 Hemorrhage of anus and rectum: Secondary | ICD-10-CM | POA: Diagnosis not present

## 2020-01-25 DIAGNOSIS — J449 Chronic obstructive pulmonary disease, unspecified: Secondary | ICD-10-CM | POA: Insufficient documentation

## 2020-01-25 DIAGNOSIS — Z79899 Other long term (current) drug therapy: Secondary | ICD-10-CM | POA: Diagnosis not present

## 2020-01-25 DIAGNOSIS — I129 Hypertensive chronic kidney disease with stage 1 through stage 4 chronic kidney disease, or unspecified chronic kidney disease: Secondary | ICD-10-CM | POA: Diagnosis not present

## 2020-01-25 DIAGNOSIS — Z20822 Contact with and (suspected) exposure to covid-19: Secondary | ICD-10-CM | POA: Diagnosis not present

## 2020-01-25 DIAGNOSIS — Z85828 Personal history of other malignant neoplasm of skin: Secondary | ICD-10-CM | POA: Diagnosis not present

## 2020-01-25 DIAGNOSIS — E1122 Type 2 diabetes mellitus with diabetic chronic kidney disease: Secondary | ICD-10-CM | POA: Insufficient documentation

## 2020-01-25 DIAGNOSIS — K922 Gastrointestinal hemorrhage, unspecified: Principal | ICD-10-CM | POA: Insufficient documentation

## 2020-01-25 DIAGNOSIS — E1165 Type 2 diabetes mellitus with hyperglycemia: Secondary | ICD-10-CM

## 2020-01-25 DIAGNOSIS — I5022 Chronic systolic (congestive) heart failure: Secondary | ICD-10-CM

## 2020-01-25 LAB — LIPASE, BLOOD: Lipase: 103 U/L — ABNORMAL HIGH (ref 11–51)

## 2020-01-25 LAB — CBC WITH DIFFERENTIAL/PLATELET
Abs Immature Granulocytes: 0.19 10*3/uL — ABNORMAL HIGH (ref 0.00–0.07)
Basophils Absolute: 0.1 10*3/uL (ref 0.0–0.1)
Basophils Relative: 1 %
Eosinophils Absolute: 0.6 10*3/uL — ABNORMAL HIGH (ref 0.0–0.5)
Eosinophils Relative: 4 %
HCT: 31.5 % — ABNORMAL LOW (ref 36.0–46.0)
Hemoglobin: 9.6 g/dL — ABNORMAL LOW (ref 12.0–15.0)
Immature Granulocytes: 1 %
Lymphocytes Relative: 19 %
Lymphs Abs: 2.6 10*3/uL (ref 0.7–4.0)
MCH: 30.7 pg (ref 26.0–34.0)
MCHC: 30.5 g/dL (ref 30.0–36.0)
MCV: 100.6 fL — ABNORMAL HIGH (ref 80.0–100.0)
Monocytes Absolute: 1 10*3/uL (ref 0.1–1.0)
Monocytes Relative: 7 %
Neutro Abs: 9.7 10*3/uL — ABNORMAL HIGH (ref 1.7–7.7)
Neutrophils Relative %: 68 %
Platelets: 376 10*3/uL (ref 150–400)
RBC: 3.13 MIL/uL — ABNORMAL LOW (ref 3.87–5.11)
RDW: 17.3 % — ABNORMAL HIGH (ref 11.5–15.5)
WBC: 14.3 10*3/uL — ABNORMAL HIGH (ref 4.0–10.5)
nRBC: 0 % (ref 0.0–0.2)

## 2020-01-25 LAB — CBC
HCT: 26.5 % — ABNORMAL LOW (ref 36.0–46.0)
Hemoglobin: 8.1 g/dL — ABNORMAL LOW (ref 12.0–15.0)
MCH: 30.7 pg (ref 26.0–34.0)
MCHC: 30.6 g/dL (ref 30.0–36.0)
MCV: 100.4 fL — ABNORMAL HIGH (ref 80.0–100.0)
Platelets: 331 10*3/uL (ref 150–400)
RBC: 2.64 MIL/uL — ABNORMAL LOW (ref 3.87–5.11)
RDW: 17.5 % — ABNORMAL HIGH (ref 11.5–15.5)
WBC: 12.3 10*3/uL — ABNORMAL HIGH (ref 4.0–10.5)
nRBC: 0 % (ref 0.0–0.2)

## 2020-01-25 LAB — COMPREHENSIVE METABOLIC PANEL
ALT: 12 U/L (ref 0–44)
AST: 18 U/L (ref 15–41)
Albumin: 1.8 g/dL — ABNORMAL LOW (ref 3.5–5.0)
Alkaline Phosphatase: 102 U/L (ref 38–126)
Anion gap: 13 (ref 5–15)
BUN: 70 mg/dL — ABNORMAL HIGH (ref 8–23)
CO2: 21 mmol/L — ABNORMAL LOW (ref 22–32)
Calcium: 8.4 mg/dL — ABNORMAL LOW (ref 8.9–10.3)
Chloride: 103 mmol/L (ref 98–111)
Creatinine, Ser: 2.95 mg/dL — ABNORMAL HIGH (ref 0.44–1.00)
GFR, Estimated: 17 mL/min — ABNORMAL LOW (ref >60)
Glucose, Bld: 171 mg/dL — ABNORMAL HIGH (ref 70–99)
Potassium: 4.7 mmol/L (ref 3.5–5.1)
Sodium: 137 mmol/L (ref 135–145)
Total Bilirubin: 0.7 mg/dL (ref 0.3–1.2)
Total Protein: 7.2 g/dL (ref 6.5–8.1)

## 2020-01-25 LAB — TROPONIN I (HIGH SENSITIVITY)
Troponin I (High Sensitivity): 13 ng/L (ref <18)
Troponin I (High Sensitivity): 16 ng/L (ref <18)

## 2020-01-25 LAB — BASIC METABOLIC PANEL
Anion gap: 14 (ref 5–15)
BUN: 70 mg/dL — ABNORMAL HIGH (ref 8–23)
CO2: 20 mmol/L — ABNORMAL LOW (ref 22–32)
Calcium: 8 mg/dL — ABNORMAL LOW (ref 8.9–10.3)
Chloride: 105 mmol/L (ref 98–111)
Creatinine, Ser: 2.86 mg/dL — ABNORMAL HIGH (ref 0.44–1.00)
GFR, Estimated: 17 mL/min — ABNORMAL LOW (ref >60)
Glucose, Bld: 149 mg/dL — ABNORMAL HIGH (ref 70–99)
Potassium: 4.6 mmol/L (ref 3.5–5.1)
Sodium: 139 mmol/L (ref 135–145)

## 2020-01-25 LAB — TYPE AND SCREEN
ABO/RH(D): A NEG
Antibody Screen: NEGATIVE

## 2020-01-25 LAB — CBG MONITORING, ED
Glucose-Capillary: 122 mg/dL — ABNORMAL HIGH (ref 70–99)
Glucose-Capillary: 154 mg/dL — ABNORMAL HIGH (ref 70–99)
Glucose-Capillary: 137 mg/dL — ABNORMAL HIGH (ref 70–99)

## 2020-01-25 LAB — RESP PANEL BY RT-PCR (FLU A&B, COVID) ARPGX2
Influenza A by PCR: NEGATIVE
Influenza B by PCR: NEGATIVE
SARS Coronavirus 2 by RT PCR: NEGATIVE

## 2020-01-25 LAB — PROTIME-INR
INR: 1.1 (ref 0.8–1.2)
Prothrombin Time: 13.7 seconds (ref 11.4–15.2)

## 2020-01-25 LAB — LACTIC ACID, PLASMA: Lactic Acid, Venous: 1.2 mmol/L (ref 0.5–1.9)

## 2020-01-25 MED ORDER — HYDROCODONE-ACETAMINOPHEN 5-325 MG PO TABS
1.0000 | ORAL_TABLET | Freq: Four times a day (QID) | ORAL | Status: DC | PRN
Start: 2020-01-25 — End: 2020-01-26
  Administered 2020-01-25: 1 via ORAL
  Filled 2020-01-25 (×2): qty 1

## 2020-01-25 MED ORDER — HYDROCORTISONE ACETATE 25 MG RE SUPP
25.0000 mg | Freq: Once | RECTAL | Status: AC
  Administered 2020-01-25: 16:00:00 25 mg via RECTAL
  Filled 2020-01-25: qty 1

## 2020-01-25 MED ORDER — ACETAMINOPHEN 325 MG PO TABS
325.0000 mg | ORAL_TABLET | Freq: Four times a day (QID) | ORAL | Status: DC | PRN
  Filled 2020-01-25: qty 1

## 2020-01-25 MED ORDER — BENZTROPINE MESYLATE 1 MG PO TABS
1.0000 mg | ORAL_TABLET | Freq: Two times a day (BID) | ORAL | Status: DC
  Administered 2020-01-25 – 2020-01-26 (×2): 1 mg via ORAL
  Filled 2020-01-25 (×4): qty 1

## 2020-01-25 MED ORDER — NITROGLYCERIN 0.4 MG SL SUBL: 0.4000 mg | SUBLINGUAL_TABLET | SUBLINGUAL | Status: DC | PRN

## 2020-01-25 MED ORDER — BISACODYL 5 MG PO TBEC: 10.0000 mg | DELAYED_RELEASE_TABLET | Freq: Every day | ORAL | Status: DC | PRN

## 2020-01-25 MED ORDER — HEPARIN SODIUM (PORCINE) 5000 UNIT/ML IJ SOLN
5000.0000 [IU] | Freq: Three times a day (TID) | INTRAMUSCULAR | Status: DC
  Administered 2020-01-25: 06:00:00 5000 [IU] via SUBCUTANEOUS
  Filled 2020-01-25: qty 1

## 2020-01-25 MED ORDER — IPRATROPIUM-ALBUTEROL 0.5-2.5 (3) MG/3ML IN SOLN: 3.0000 mL | RESPIRATORY_TRACT | Status: DC | PRN

## 2020-01-25 MED ORDER — HYDROCORTISONE ACETATE 25 MG RE SUPP
25.0000 mg | Freq: Two times a day (BID) | RECTAL | Status: DC
  Administered 2020-01-25: 10:00:00 25 mg via RECTAL
  Filled 2020-01-25 (×2): qty 1

## 2020-01-25 MED ORDER — NYSTATIN 100000 UNIT/GM EX POWD: 1.0000 "application " | Freq: Two times a day (BID) | CUTANEOUS | Status: DC

## 2020-01-25 MED ORDER — CLOPIDOGREL BISULFATE 75 MG PO TABS
75.0000 mg | ORAL_TABLET | Freq: Every day | ORAL | Status: DC
  Administered 2020-01-26: 12:00:00 75 mg via ORAL
  Filled 2020-01-25 (×2): qty 1

## 2020-01-25 MED ORDER — INSULIN ASPART 100 UNIT/ML ~~LOC~~ SOLN
0.0000 [IU] | Freq: Three times a day (TID) | SUBCUTANEOUS | Status: DC
  Administered 2020-01-25 (×2): 2 [IU] via SUBCUTANEOUS
  Administered 2020-01-25: 12:00:00 3 [IU] via SUBCUTANEOUS
  Administered 2020-01-26 (×2): 2 [IU] via SUBCUTANEOUS
  Filled 2020-01-25 (×5): qty 1

## 2020-01-25 MED ORDER — DOCUSATE SODIUM 100 MG PO CAPS
100.0000 mg | ORAL_CAPSULE | Freq: Two times a day (BID) | ORAL | Status: DC
  Administered 2020-01-25 – 2020-01-26 (×2): 100 mg via ORAL
  Filled 2020-01-25 (×2): qty 1

## 2020-01-25 MED ORDER — SODIUM CHLORIDE 0.9 % IV BOLUS
1000.0000 mL | Freq: Once | INTRAVENOUS | Status: AC
  Administered 2020-01-25: 04:00:00 1000 mL via INTRAVENOUS

## 2020-01-25 MED ORDER — INSULIN ASPART 100 UNIT/ML ~~LOC~~ SOLN: 0.0000 [IU] | Freq: Every day | SUBCUTANEOUS | Status: DC

## 2020-01-25 MED ORDER — INSULIN LISPRO 100 UNIT/ML CARTRIDGE: 5.0000 [IU] | SUBCUTANEOUS | Status: DC

## 2020-01-25 MED ORDER — VITAMIN B-12 1000 MCG PO TABS: 1000.0000 ug | ORAL_TABLET | ORAL | Status: DC

## 2020-01-25 MED ORDER — INSULIN GLARGINE 100 UNIT/ML ~~LOC~~ SOLN: 85.0000 [IU] | Freq: Every day | SUBCUTANEOUS | Status: DC

## 2020-01-25 MED ORDER — FLUTICASONE PROPIONATE 50 MCG/ACT NA SUSP
1.0000 | Freq: Two times a day (BID) | NASAL | Status: DC | PRN
  Filled 2020-01-25: qty 16

## 2020-01-25 MED ORDER — ONDANSETRON HCL 4 MG PO TABS: 4.0000 mg | ORAL_TABLET | Freq: Four times a day (QID) | ORAL | Status: DC | PRN

## 2020-01-25 MED ORDER — POLYETHYLENE GLYCOL 3350 17 G PO PACK
17.0000 g | PACK | Freq: Every day | ORAL | Status: DC
  Filled 2020-01-25: qty 1

## 2020-01-25 MED ORDER — BUSPIRONE HCL 10 MG PO TABS
10.0000 mg | ORAL_TABLET | Freq: Three times a day (TID) | ORAL | Status: DC
  Administered 2020-01-25 – 2020-01-26 (×4): 10 mg via ORAL
  Filled 2020-01-25: qty 1
  Filled 2020-01-25: qty 2
  Filled 2020-01-25: qty 1
  Filled 2020-01-25: qty 2
  Filled 2020-01-25 (×2): qty 1

## 2020-01-25 MED ORDER — ACETAMINOPHEN 650 MG RE SUPP: 325.0000 mg | Freq: Four times a day (QID) | RECTAL | Status: DC | PRN

## 2020-01-25 MED ORDER — ONDANSETRON HCL 4 MG/2ML IJ SOLN
4.0000 mg | Freq: Four times a day (QID) | INTRAMUSCULAR | Status: DC | PRN
  Administered 2020-01-25: 19:00:00 4 mg via INTRAVENOUS
  Filled 2020-01-25: qty 2

## 2020-01-25 MED ORDER — ETHACRYNIC ACID 25 MG PO TABS
75.0000 mg | ORAL_TABLET | Freq: Two times a day (BID) | ORAL | Status: DC
  Administered 2020-01-25: 19:00:00 75 mg via ORAL
  Filled 2020-01-25 (×5): qty 3

## 2020-01-25 NOTE — ED Notes (Signed)
Pt rolled to administer hydrocortisone suppository at this time. Upon admin of suppository, this RN and Sam RN noted moderate amount of bright red blood coming out of pt rectum at this time. Pt cleaned, given clean chux and brief at this time. Pt pulled up in bed at this time.

## 2020-01-25 NOTE — ED Triage Notes (Signed)
Pt arrival via [facility] from [facility] due to rectal bleeding. Per staff, tonight the pt began having bright red rectal bleeding. Pt has hemorrhoids earlier in the week and had been straining a lot, but hadn't had issues since. Pt denies any more abd pain than usual and n/v. Pt was dc'd on Sunday due to UTI.   Pt is on plavix. Pt on 3 L o2 at baseline.   VS with EMS: -128/76 -HR 94 -99% on 3 L

## 2020-01-25 NOTE — H&P (Addendum)
History and Physical   Gabrielle [name] KXF:[id number] DOB: 02-03-1951 DOA: 01/25/2020  PCP: System, Provider Not In  Outpatient Specialists: [facility]  Patient coming from: Facility  I have personally briefly reviewed patient's old medical records in [place].  Chief Concern: Bright red blood per rectum  HPI: Gabrielle [name] is a 69 y.o. female with medical history significant for hypertension, morbid obesity, COPD, obstructive sleep apnea, history of psychosis unspecified type with paranoia, presented to the emergency department from [place] care center for chief concerns of bright red blood per rectum.  Patient is mostly bedbound.  When nursing staff was turning her over to help clean her they noticed bright red blood per rectum and sent her to the emergency department for further evaluation.  Patient denies changes to her diet.  ROS was negative headache, vision changes, vomiting, chest pain, shortness of breath, dysuria, hematuria, fever, cough.  She endorses chronic nausea. She endorses abdominal pain everytime she eats and this is chronic for her.  Social history: lives at [facility] for the last 3 years. She denies tobacco, etoh, recreational drug use. She was exposed continuously by second hand smoke growing up  ED Course: Discussed with ED provider  Review of Systems: As per HPI otherwise 10 point review of systems negative.  Assessment/Plan  Active Problems:   BRBPR (bright red blood per rectum)   Blood per rectum-likely secondary to internal hemorrhoids -Patient has history of internal hemorrhoids -H&H is stable at 9.6/31.5 from 8.0/25 5 days ago on discharge of previous hospitalization on 01/20/2020 -CBC in the a.m. -N.p.o.  Insulin-dependent diabetes mellitus-resume home insulin  Heart failure reduced ejection fraction-appears euvolemic and compensated -Patient's home heart failure medication is not optimized,  would recommend cardiology referral and/or follow-up for heart failure medication optimization  Morbid obesity with bedbound state, BMI of 43 GERD-omeprazole resumed COPD-resumed home nebulizers Chronic pain-resumed home Norco Decubitus ulcer/dermatitis-present on admission, wound consult Chart reviewed.   Hospitalization from 01/17/2020-01/20/2020 and treated for AKI secondary to dehydration, hypovolemic hyponatremia as well as diarrhea.  She tested negative for GI pathogen and C. difficile.  Her diarrhea improved.  She was subsequently diagnosed with UTI and started on IV Rocephin.  Urine culture grew gram-negative rods.  Hydralazine was held at that time due to blood pressure remaining stable despite not having hydralazine.  She was prescribed home with 4 more days of oral amoxicillin to complete course for UTI.  DVT prophylaxis: Heparin Code Status: Full code Diet: N.p.o. Family Communication: no updates needed Disposition Plan: Pending clinical course, H&H Consults called: None at this time Admission status: Observation  Past Medical History:  Diagnosis Date  . Allergy    seasonal allergies  . Anemia    anemia of chronic kidney disease  . Arthritis   . Cellulitis and abscess of left leg   . Chronic kidney disease    she reports grade 2 kidney disease  . Chronic venous stasis dermatitis of both lower extremities   . Diabetes mellitus without complication ([place])   . Hypertension   . Morbid obesity ([place])   . Osteoarthritis   . Oxygen dependent   . Skin cancer   . Sleep apnea    Past Surgical History:  Procedure Laterality Date  . NO PAST SURGERIES    . none     Social History:  reports that she has never smoked. She has never used smokeless tobacco. She reports that she does not drink alcohol and does not  use drugs.  Allergies  Allergen Reactions  . Lasix [Furosemide]     Numbness and tingling of the face   . Ultram [Tramadol] Other (See Comments)    Pt reports,  "makes my head feel funny."  . Erythromycin Rash  . Plasticized Base [Plastibase] Rash  . Tetracyclines & Related Rash   Family History  Problem Relation Age of Onset  . Diabetes Mellitus II Father   . CAD Father   . CAD Mother   . Hiatal hernia Mother    Family history: Family history reviewed and not pertinent  Prior to Admission medications   Medication Sig Start Date End Date Taking? Authorizing Provider  acetaminophen (TYLENOL) 500 MG tablet Take 500 mg by mouth 3 (three) times daily.    [provider]  benztropine (COGENTIN) 1 MG tablet Take 1 mg by mouth 2 (two) times daily.  04/26/17   [provider]  bisacodyl (DULCOLAX) 5 MG EC tablet Take 10 mg by mouth daily as needed for moderate constipation or severe constipation.    [provider]  busPIRone (BUSPAR) 10 MG tablet Take 10 mg by mouth 3 (three) times daily.     [provider]  Cholecalciferol (VITAMIN D3) 1000 units CAPS Take 1,000 Units by mouth daily.     [provider]  clobetasol cream (TEMOVATE) 6.26 % Apply 1 application topically in the morning and at bedtime. (apply to scalp, legs and arms)    [provider]  clopidogrel (PLAVIX) 75 MG tablet Take 1 tablet (75 mg total) by mouth daily. 08/15/19 08/14/20  Max Sane, MD  docusate sodium (COLACE) 100 MG capsule Take 100 mg by mouth daily.     [provider]  docusate sodium (COLACE) 100 MG capsule Take 100 mg by mouth every 8 (eight) hours as needed for mild constipation or moderate constipation.    [provider]  empagliflozin (JARDIANCE) 25 MG TABS tablet Take 1 tablet (25 mg total) by mouth daily before breakfast. 08/15/19   Max Sane, MD  ethacrynic acid (EDECRIN) 25 MG tablet Take 3 tablets (75 mg total) by mouth 2 (two) times daily. 05/04/16   Vaughan Basta, MD  Exenatide ER (BYDUREON BCISE) 2 MG/0.85ML AUIJ Inject 2 mg into the skin every Wednesday.    [provider]   fluticasone (FLONASE) 50 MCG/ACT nasal spray Place 1 spray into both nostrils every 12 (twelve) hours as needed for allergies.     [provider]  HUMALOG 100 UNIT/ML cartridge Inject 5 Units into the skin See admin instructions. Inject 5u under the skin three times daily with meals with sliding scale    [provider]  HYDROcodone-acetaminophen (NORCO/VICODIN) 5-325 MG tablet Take 1 tablet by mouth every 6 (six) hours as needed for up to 10 doses for moderate pain or severe pain. 01/20/20   Darliss Cheney, MD  insulin glargine (LANTUS) 100 UNIT/ML injection Inject 0.85 mLs (85 Units total) into the skin daily. Patient taking differently: Inject 43 Units into the skin 2 (two) times daily.  05/04/16   Vaughan Basta, MD  ipratropium-albuterol (DUONEB) 0.5-2.5 (3) MG/3ML SOLN Take 3 mLs by nebulization every 4 (four) hours as needed (congestion).    [provider]  Melatonin 1 MG TABS Take 3 mg by mouth at bedtime.     [provider]  nabumetone (RELAFEN) 500 MG tablet Take 500 mg by mouth daily.     [provider]  nitroGLYCERIN (NITROSTAT) 0.4 MG SL tablet  Place 0.4 mg under the tongue every 5 (five) minutes as needed for chest pain.    [provider]  nystatin (MYCOSTATIN/NYSTOP) powder Apply 1 application topically 2 (two) times daily. (apply to perennial area)    [provider]  nystatin cream (MYCOSTATIN) Apply 1 application topically 2 (two) times daily. (apply to perennial area)    [provider]  omeprazole (PRILOSEC) 20 MG capsule Take 20 mg by mouth every other day.    [provider]  perphenazine (TRILAFON) 2 MG tablet Take 1 mg by mouth 2 (two) times daily.     [provider]  polyethylene glycol (MIRALAX / GLYCOLAX) packet Take 17 g by mouth daily.    [provider]  sodium chloride (OCEAN) 0.65 % SOLN nasal spray Place 2 sprays into both nostrils every 8 (eight) hours as  needed for congestion.    [provider]  vitamin B-12 (CYANOCOBALAMIN) 1000 MCG tablet Take 1,000 mcg by mouth every Monday, Wednesday, and Friday.    [provider]   Physical Exam: Vitals:   01/25/20 0245 01/25/20 0247  BP: 137/68   Pulse: (!) 110   Resp: 18   Temp: 98.3 F (36.8 C)   TempSrc: Oral   SpO2: 100%   Weight:  123 kg  Height:  5\' 6"  (1.676 m)   Constitutional: appears older than chronological age, NAD, calm, comfortable Eyes: PERRL, lids and conjunctivae normal ENMT: Mucous membranes are moist. Posterior pharynx clear of any exudate or lesions. Age-appropriate dentition. Hearing appropriate Neck: normal, supple, no masses, no thyromegaly Respiratory: clear to auscultation bilaterally, no wheezing, no crackles. Normal respiratory effort. No accessory muscle use.  Cardiovascular: Regular rate and rhythm, no murmurs / rubs / gallops. No extremity edema. 2+ pedal pulses. No carotid bruits.  Abdomen: morbidly obese abdomen, no tenderness, no masses palpated, no hepatosplenomegaly. Bowel sounds positive.  Musculoskeletal: no clubbing / cyanosis. No joint deformity upper and lower extremities. Good ROM, no contractures, no atrophy. Normal muscle tone.  Skin: no rashes, lesions, ulcers. No induration. Left lower outer leg wound. Bilateral decubitus ulcer Neurologic: Sensation intact. Strength 5/5 in all 4.  Psychiatric: Normal judgment and insight. Alert and oriented x 3. Normal mood.   EKG: Independently reviewed, showing sinus tachycardia 106, QTc 461  Chest x-ray on Admission: Personally reviewed and I agree with radiologist reading as below.  DG Chest Port 1 View  Result Date: 01/25/2020 CLINICAL DATA:  Bright red rectal bleeding. EXAM: PORTABLE CHEST 1 VIEW COMPARISON:  January 17, 2020 FINDINGS: The heart size and mediastinal contours are within normal limits. Both lungs are clear. Degenerative changes are seen involving the bilateral shoulders and  thoracic spine. IMPRESSION: No active disease. Electronically Signed   By: Virgina Norfolk M.D.   On: 01/25/2020 03:17   Labs on Admission: I have personally reviewed following labs  CBC: Recent Labs  Lab 01/19/20 0425 01/20/20 0553 01/25/20 0247  WBC 12.8* 10.2 14.3*  NEUTROABS 8.6* 6.2 9.7*  HGB 8.6* 8.0* 9.6*  HCT 26.5* 25.1* 31.5*  MCV 93.6 95.4 100.6*  PLT 438* 379 762   Basic Metabolic Panel: Recent Labs  Lab 01/18/20 1341 01/18/20 1942 01/19/20 0425 01/20/20 0553 01/25/20 0247  NA 127* 130* 131* 133* 137  K  --   --  4.4 4.6 4.7  CL  --   --  96* 100 103  CO2  --   --  21* 20* 21*  GLUCOSE  --   --  126* 142* 171*  BUN  --   --  85* 81* 70*  CREATININE  --   --  3.12* 3.05* 2.95*  CALCIUM  --   --  7.5* 7.5* 8.4*   GFR: Estimated Creatinine Clearance: 24.8 mL/min (A) (by C-G formula based on SCr of 2.95 mg/dL (H)).  Liver Function Tests: Recent Labs  Lab 01/25/20 0247  AST 18  ALT 12  ALKPHOS 102  BILITOT 0.7  PROT 7.2  ALBUMIN 1.8*   Recent Labs  Lab 01/25/20 0247  LIPASE 103*   Coagulation Profile: Recent Labs  Lab 01/25/20 0247  INR 1.1   CBG: Recent Labs  Lab 01/19/20 1143 01/19/20 1731 01/19/20 2048 01/20/20 0743 01/20/20 1148  GLUCAP 181* 203* 161* 125* 251*   Urine analysis:    Component Value Date/Time   COLORURINE YELLOW (A) 01/17/2020 1518   APPEARANCEUR TURBID (A) 01/17/2020 1518   LABSPEC 1.013 01/17/2020 1518   PHURINE 5.0 01/17/2020 1518   GLUCOSEU NEGATIVE 01/17/2020 1518   HGBUR MODERATE (A) 01/17/2020 1518   BILIRUBINUR NEGATIVE 01/17/2020 1518   [place] 01/17/2020 1518   PROTEINUR 100 (A) 01/17/2020 1518   NITRITE NEGATIVE 01/17/2020 1518   LEUKOCYTESUR LARGE (A) 01/17/2020 1518   Anaid Haney N Tionne Carelli D.O. [facility]  If 12AM-7AM, please contact overnight-coverage provider If 7AM-7PM, please contact day coverage provider [email or web address]  01/25/2020, 4:57 AM

## 2020-01-25 NOTE — ED Notes (Signed)
Repositioned patient onto right side. No stool or blood noted on chux. Bed in lowest position and locked, call bell in reach. No complaints from patient.

## 2020-01-25 NOTE — ED Provider Notes (Signed)
[facility] Emergency Department Provider Note   ____________________________________________   Event Date/Time   First MD Initiated Contact with Patient 01/25/20 0246     (approximate)  I have reviewed the triage vital signs and the nursing notes.   HISTORY  Chief Complaint Rectal Bleeding    HPI Gabrielle [name] is a 69 y.o. female brought to the ED via EMS from SNF with a chief complaint of rectal bleeding.  Patient has a history of diabetes, hypertension, CKD, COPD on 3 L continuous oxygenation.  Nursing home staff sent patient for noted bright red blood per rectum.  Patient admitted to the hospital last week for diarrhea, acute on chronic renal failure, UTI and sacral decubitus wound.  Patient denies fever, cough, chest pain, shortness of breath, abdominal pain, nausea or vomiting.  She does take Plavix.     Past Medical History:  Diagnosis Date   Allergy    seasonal allergies   Anemia    anemia of chronic kidney disease   Arthritis    Cellulitis and abscess of left leg    Chronic kidney disease    she reports grade 2 kidney disease   Chronic venous stasis dermatitis of both lower extremities    Diabetes mellitus without complication ([place])    Hypertension    Morbid obesity ([place])    Osteoarthritis    Oxygen dependent    Skin cancer    Sleep apnea     Patient Active Problem List   Diagnosis Date Noted   Hyponatremia 01/17/2020   Anemia of chronic kidney failure, stage 4 (severe) ([place]) 01/17/2020   Diarrhea 01/17/2020   Sacral decubitus ulcer 01/17/2020   Iron deficiency anemia 10/21/2019   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   OSA (obstructive sleep apnea) 08/14/2019   Obesity, Class III, BMI 40-49.9 (morbid obesity) ([place]) 08/14/2019   UTI (urinary tract infection) 08/14/2019   Aphasia 08/13/2019   Basal cell carcinoma of glabella 03/04/2019   Cellulitis 04/29/2016   Acute kidney injury superimposed  on CKD ([place]) 10/02/2015   Diabetes ([place]) 10/01/2015   SOB (shortness of breath) 10/01/2015   AKI (acute kidney injury) ([place]) 10/01/2015   Hyperkalemia 07/21/2015   Uncontrolled hypertension 07/21/2015    Past Surgical History:  Procedure Laterality Date   NO PAST SURGERIES     none      Prior to Admission medications   Medication Sig Start Date End Date Taking? Authorizing Provider  acetaminophen (TYLENOL) 500 MG tablet Take 500 mg by mouth 3 (three) times daily.    [provider]  benztropine (COGENTIN) 1 MG tablet Take 1 mg by mouth 2 (two) times daily.  04/26/17   [provider]  bisacodyl (DULCOLAX) 5 MG EC tablet Take 10 mg by mouth daily as needed for moderate constipation or severe constipation.    [provider]  busPIRone (BUSPAR) 10 MG tablet Take 10 mg by mouth 3 (three) times daily.     [provider]  Cholecalciferol (VITAMIN D3) 1000 units CAPS Take 1,000 Units by mouth daily.     [provider]  clobetasol cream (TEMOVATE) 9.51 % Apply 1 application topically in the morning and at bedtime. (apply to scalp, legs and arms)    [provider]  clopidogrel (PLAVIX) 75 MG tablet Take 1 tablet (75 mg total) by mouth daily. 08/15/19 08/14/20  Max Sane, MD  docusate sodium (COLACE) 100 MG capsule Take 100 mg by mouth daily.  [provider]  docusate sodium (COLACE) 100 MG capsule Take 100 mg by mouth every 8 (eight) hours as needed for mild constipation or moderate constipation.    [provider]  empagliflozin (JARDIANCE) 25 MG TABS tablet Take 1 tablet (25 mg total) by mouth daily before breakfast. 08/15/19   Max Sane, MD  ethacrynic acid (EDECRIN) 25 MG tablet Take 3 tablets (75 mg total) by mouth 2 (two) times daily. 05/04/16   Vaughan Basta, MD  Exenatide ER (BYDUREON BCISE) 2 MG/0.85ML AUIJ Inject 2 mg into the skin every Wednesday.    [provider]  fluticasone  (FLONASE) 50 MCG/ACT nasal spray Place 1 spray into both nostrils every 12 (twelve) hours as needed for allergies.     [provider]  HUMALOG 100 UNIT/ML cartridge Inject 5 Units into the skin See admin instructions. Inject 5u under the skin three times daily with meals with sliding scale    [provider]  HYDROcodone-acetaminophen (NORCO/VICODIN) 5-325 MG tablet Take 1 tablet by mouth every 6 (six) hours as needed for up to 10 doses for moderate pain or severe pain. 01/20/20   Darliss Cheney, MD  insulin glargine (LANTUS) 100 UNIT/ML injection Inject 0.85 mLs (85 Units total) into the skin daily. Patient taking differently: Inject 43 Units into the skin 2 (two) times daily.  05/04/16   Vaughan Basta, MD  ipratropium-albuterol (DUONEB) 0.5-2.5 (3) MG/3ML SOLN Take 3 mLs by nebulization every 4 (four) hours as needed (congestion).    [provider]  Melatonin 1 MG TABS Take 3 mg by mouth at bedtime.     [provider]  nabumetone (RELAFEN) 500 MG tablet Take 500 mg by mouth daily.     [provider]  nitroGLYCERIN (NITROSTAT) 0.4 MG SL tablet Place 0.4 mg under the tongue every 5 (five) minutes as needed for chest pain.    [provider]  nystatin (MYCOSTATIN/NYSTOP) powder Apply 1 application topically 2 (two) times daily. (apply to perennial area)    [provider]  nystatin cream (MYCOSTATIN) Apply 1 application topically 2 (two) times daily. (apply to perennial area)    [provider]  omeprazole (PRILOSEC) 20 MG capsule Take 20 mg by mouth every other day.    [provider]  perphenazine (TRILAFON) 2 MG tablet Take 1 mg by mouth 2 (two) times daily.     [provider]  polyethylene glycol (MIRALAX / GLYCOLAX) packet Take 17 g by mouth daily.    [provider]  sodium chloride (OCEAN) 0.65 % SOLN nasal spray Place 2 sprays into both nostrils every 8 (eight) hours as needed for  congestion.    [provider]  vitamin B-12 (CYANOCOBALAMIN) 1000 MCG tablet Take 1,000 mcg by mouth every Monday, Wednesday, and Friday.    [provider]    Allergies Lasix [furosemide], Ultram [tramadol], Erythromycin, Plasticized base [plastibase], and Tetracyclines & related  Family History  Problem Relation Age of Onset   Diabetes Mellitus II Father    CAD Father    CAD Mother    Hiatal hernia Mother     Social History Social History   Tobacco Use   Smoking status: Never Smoker   Smokeless tobacco: Never Used  Scientific laboratory technician Use: Never used  Substance Use Topics   Alcohol use: No   Drug use: No    Review of Systems  Constitutional: No fever/chills Eyes: No visual changes. ENT: No sore throat. Cardiovascular: Denies  chest pain. Respiratory: Denies shortness of breath. Gastrointestinal: Positive for rectal bleeding.  No abdominal pain.  No nausea, no vomiting.  No diarrhea.  No constipation. Genitourinary: Negative for dysuria. Musculoskeletal: Negative for back pain. Skin: Negative for rash. Neurological: Negative for headaches, focal weakness or numbness.   ____________________________________________   PHYSICAL EXAM:  VITAL SIGNS: ED Triage Vitals  Enc Vitals Group     BP 01/25/20 0245 137/68     Pulse Rate 01/25/20 0245 (!) 110     Resp 01/25/20 0245 18     Temp 01/25/20 0245 98.3 F (36.8 C)     Temp Source 01/25/20 0245 Oral     SpO2 01/25/20 0245 100 %     Weight 01/25/20 0247 271 lb 2.7 oz (123 kg)     Height 01/25/20 0247 5\' 6"  (1.676 m)     Head Circumference --      Peak Flow --      Pain Score 01/25/20 0247 6     Pain Loc --      Pain Edu? --      Excl. in [place]? --     Constitutional: Alert and oriented.  Chronically ill appearing and in mild acute distress. Eyes: Conjunctivae are normal. PERRL. EOMI. Head: Atraumatic. Nose: No congestion/rhinnorhea. Mouth/Throat: Mucous membranes are mildly dry.    Neck: No stridor.   Cardiovascular: Tachycardic rate, regular rhythm. Grossly normal heart sounds.  Good peripheral circulation. Respiratory: Normal respiratory effort.  No retractions. Lungs CTAB. Gastrointestinal: Obese.  Soft and nontender to light or deep palpation. No distention. No abdominal bruits. No CVA tenderness. Rectal: Sacral decubitus wound.  Gross blood noted. Musculoskeletal: No lower extremity tenderness nor edema.  Chronic venous stasis ulcers.  No joint effusions. Neurologic:  Normal speech and language. No gross focal neurologic deficits are appreciated.  Skin:  Skin is warm, dry and intact. No rash noted. Psychiatric: Mood and affect are normal. Speech and behavior are normal.  ____________________________________________   LABS (all labs ordered are listed, but only abnormal results are displayed)  Labs Reviewed  COMPREHENSIVE METABOLIC PANEL - Abnormal; Notable for the following components:      Result Value   CO2 21 (*)    Glucose, Bld 171 (*)    BUN 70 (*)    Creatinine, Ser 2.95 (*)    Calcium 8.4 (*)    Albumin 1.8 (*)    GFR, Estimated 17 (*)    All other components within normal limits  CBC WITH DIFFERENTIAL/PLATELET - Abnormal; Notable for the following components:   WBC 14.3 (*)    RBC 3.13 (*)    Hemoglobin 9.6 (*)    HCT 31.5 (*)    MCV 100.6 (*)    RDW 17.3 (*)    Neutro Abs 9.7 (*)    Eosinophils Absolute 0.6 (*)    Abs Immature Granulocytes 0.19 (*)    All other components within normal limits  LIPASE, BLOOD - Abnormal; Notable for the following components:   Lipase 103 (*)    All other components within normal limits  RESP PANEL BY RT-PCR (FLU A&B, COVID) ARPGX2  PROTIME-INR  LACTIC ACID, PLASMA  TYPE AND SCREEN  TROPONIN I (HIGH SENSITIVITY)   ____________________________________________  EKG  ED ECG REPORT I, Topacio Cella J, the attending physician, personally viewed and interpreted this ECG.   Date: 01/25/2020  EKG Time:  0307  Rate: 106  Rhythm: sinus tachycardia  Axis: Normal  Intervals:none  ST&T Change: Nonspecific  ____________________________________________  RADIOLOGY I, Jesson Foskey J, personally viewed and evaluated these images (plain radiographs) as part of my medical decision making, as well as reviewing the written report by the radiologist.  ED MD interpretation: No acute cardiopulmonary process  Official radiology report(s): DG Chest Port 1 View  Result Date: 01/25/2020 CLINICAL DATA:  Bright red rectal bleeding. EXAM: PORTABLE CHEST 1 VIEW COMPARISON:  January 17, 2020 FINDINGS: The heart size and mediastinal contours are within normal limits. Both lungs are clear. Degenerative changes are seen involving the bilateral shoulders and thoracic spine. IMPRESSION: No active disease. Electronically Signed   By: Virgina Norfolk M.D.   On: 01/25/2020 03:17    ____________________________________________   PROCEDURES  Procedure(s) performed (including Critical Care):  .1-3 Lead EKG Interpretation Performed by: Paulette Blanch, MD Authorized by: Paulette Blanch, MD     Interpretation: abnormal     ECG rate:  110   ECG rate assessment: tachycardic     Rhythm: sinus tachycardia     Ectopy: none     Conduction: normal   Comments:     Patient placed on cardiac monitor to evaluate for arrhythmias     ____________________________________________   INITIAL IMPRESSION / ASSESSMENT AND PLAN / ED COURSE  As part of my medical decision making, I reviewed the following data within the [facility] notes reviewed and incorporated, Labs reviewed, EKG interpreted, Old chart reviewed (01/17/2020-01/20/2020 hospitalization), Radiograph reviewed, Discussed with admitting physician and Notes from prior ED visits     69 year old female presenting with rectal bleeding, on Plavix. Differential diagnosis includes, but is not limited to, ovarian cyst, ovarian torsion, acute  appendicitis, diverticulitis, urinary tract infection/pyelonephritis, endometriosis, bowel obstruction, colitis, renal colic, gastroenteritis, hernia, etc.  We will obtain lab work, type and screen, keep n.p.o.  Initiate IV fluid resuscitation.  I personally reviewed patient's recent hospitalization and see that she had a CT abdomen/pelvis on 01/17/2020 which was largely unremarkable.   Clinical Course as of 01/25/20 7616  Sat Jan 25, 2020  0330 Stable H/H and renal function.  Elevated lipase without abdominal pain.  Will discuss with hospital services for admission for rectal bleeding. [JS]    Clinical Course User Index [JS] Paulette Blanch, MD     ____________________________________________   FINAL CLINICAL IMPRESSION(S) / ED DIAGNOSES  Final diagnoses:  GI bleed  Pressure injury of skin of sacral region, unspecified injury stage  Chronic kidney disease, unspecified CKD stage     ED Discharge Orders    None      *Please note:  Gabrielle [name] was evaluated in Emergency Department on 01/25/2020 for the symptoms described in the history of present illness. She was evaluated in the context of the global COVID-19 pandemic, which necessitated consideration that the patient might be at risk for infection with the SARS-CoV-2 virus that causes COVID-19. Institutional protocols and algorithms that pertain to the evaluation of patients at risk for COVID-19 are in a state of rapid change based on information released by regulatory bodies including the CDC and federal and state organizations. These policies and algorithms were followed during the patient's care in the ED.  Some ED evaluations and interventions may be delayed as a result of limited staffing during and the pandemic.*   Note:  This document was prepared using Dragon voice recognition software and may include unintentional dictation errors.   Paulette Blanch, MD 01/25/20 [phone]

## 2020-01-25 NOTE — Progress Notes (Signed)
Patient ID: Gabrielle [name], female   DOB: 1950/04/24, 69 y.o.   MRN: [id number]  Pt seen and examined in the ER. Sister at bedside. Patient is not able to tell me whether she had any more blood in stool. RN is not able to give me any information since she just moved to see pard. Patient has significant bedsores which I cannot examine given her morbid obesity and unable to rule her. Will get examined tomorrow in her room. She has been followed by wound nurse at the facility.  No history of G.I. evaluation in the past. Her hemoglobin is stable. Came in with hemoglobin of 9.6 received 1 L of normal saline hemoglobin down to 8.1. No major bleed reported.  pt is not keen on getting any G.I. evaluation. Sister does understand and agrees with patient.  Dietitian consulted for poor PO intake and weight loss.  Wound consult for chronic decubitus present on admission.  Discussed code status patient is a full code.

## 2020-01-25 NOTE — ED Notes (Signed)
Pt cleaned at this time. Clean chux and brief applied at this time. Clean purewick applied at this time. Pt pulled up in bed and given warm blankets at this time.

## 2020-01-25 NOTE — Progress Notes (Signed)
Patient on o2 in no distress. Discussed cpap with patient. Patient states she does not wear one due to claustrophobia and does not wish to wear one here.

## 2020-01-26 DIAGNOSIS — I5022 Chronic systolic (congestive) heart failure: Secondary | ICD-10-CM | POA: Diagnosis not present

## 2020-01-26 DIAGNOSIS — N189 Chronic kidney disease, unspecified: Secondary | ICD-10-CM | POA: Diagnosis not present

## 2020-01-26 DIAGNOSIS — L89159 Pressure ulcer of sacral region, unspecified stage: Secondary | ICD-10-CM | POA: Diagnosis not present

## 2020-01-26 DIAGNOSIS — K922 Gastrointestinal hemorrhage, unspecified: Secondary | ICD-10-CM | POA: Diagnosis not present

## 2020-01-26 LAB — GLUCOSE, CAPILLARY
Glucose-Capillary: 137 mg/dL — ABNORMAL HIGH (ref 70–99)
Glucose-Capillary: 156 mg/dL — ABNORMAL HIGH (ref 70–99)

## 2020-01-26 MED ORDER — INSULIN GLARGINE 100 UNIT/ML ~~LOC~~ SOLN: 40.0000 [IU] | Freq: Every day | SUBCUTANEOUS | 11 refills | Status: AC

## 2020-01-26 MED ORDER — POLYETHYLENE GLYCOL 3350 17 G PO PACK: 17.0000 g | PACK | Freq: Every day | ORAL | Status: DC

## 2020-01-26 MED ORDER — DOCUSATE SODIUM 100 MG PO CAPS: 100.0000 mg | ORAL_CAPSULE | Freq: Every day | ORAL | Status: DC

## 2020-01-26 MED ORDER — HYDROCODONE-ACETAMINOPHEN 5-325 MG PO TABS: 1.0000 | ORAL_TABLET | Freq: Four times a day (QID) | ORAL | 0 refills | Status: AC | PRN

## 2020-01-26 NOTE — Progress Notes (Signed)
Report called to [facility] [facility].  EMS called for transportation

## 2020-01-26 NOTE — Discharge Instructions (Signed)
Pt will f/u PCP at [facility]

## 2020-01-26 NOTE — Discharge Summary (Signed)
[facility] NAME: Gabrielle [name]    MR#:  [id number]  DATE OF BIRTH:  Apr 01, 1950  DATE OF ADMISSION:  01/25/2020 ADMITTING PHYSICIAN: Gabrielle N Cox, DO  DATE OF DISCHARGE: 01/26/2020  PRIMARY CARE PHYSICIAN: System, Provider Not In    ADMISSION DIAGNOSIS:  GI bleed [K92.2] BRBPR (bright red blood per rectum) [K62.5] Chronic kidney disease, unspecified CKD stage [N18.9] Pressure injury of skin of sacral region, unspecified injury stage [L89.159]  DISCHARGE DIAGNOSIS:  Bleeding from Sacral decubitus  SECONDARY DIAGNOSIS:   Past Medical History:  Diagnosis Date  . Allergy    seasonal allergies  . Anemia    anemia of chronic kidney disease  . Arthritis   . Cellulitis and abscess of left leg   . Chronic kidney disease    she reports grade 2 kidney disease  . Chronic venous stasis dermatitis of both lower extremities   . Diabetes mellitus without complication ([place])   . Hypertension   . Morbid obesity ([place])   . Osteoarthritis   . Oxygen dependent   . Skin cancer   . Sleep apnea     HOSPITAL COURSE:  Gabrielle [name] is a 69 y.o. female with medical history significant for hypertension, morbid obesity, COPD, obstructive sleep apnea, history of psychosis unspecified type with paranoia, presented to the emergency department from [facility] for chief concerns of bright red blood per rectum. Patient is mostly bedbound.  When nursing staff was turning her over to help clean her they noticed bright red blood per rectum and sent her to the emergency department for further evaluation.     Suspected Blood per rectum- appears to be coming from her superficial ulcers which had been present on admission. -- Patient today had brown solid stool no blood noted. She had oozing area around her anus.. Wounds do not appear to be infected. Dressing changed done by RN -- Patient needs aggressive wound care as given in discharge  instruction. -- She will benefit from wound nurse/ostomy nurse follow at [facility] healthcare if feasible -- Continue use of air mattress -Patient has history of internal hemorrhoids -H&H is stable   Insulin-dependent diabetes mellitus, with CKD stage IV -- I have decreased her insulin Lantus down to 40 units. Sugars well controlled.  Heart failure reduced ejection fraction-appears euvolemic and compensated -continue her home meds.  Morbid obesity with bedbound state, BMI of 43  GERD-omeprazole resumed  COPD-resumed home nebulizers  Chronic pain-resumed home Norco  Decubitus ulcer/dermatitis-present on admission as above  Patient will discharged back to [facility]. Spoke with patient's son Gabrielle [name] regarding discharge plan. He is in agreement with it.  CONSULTS OBTAINED:    DRUG ALLERGIES:   Allergies  Allergen Reactions  . Lasix [Furosemide]     Numbness and tingling of the face   . Ultram [Tramadol] Other (See Comments)    Pt reports, "makes my head feel funny."  . Erythromycin Rash  . Plasticized Base [Plastibase] Rash  . Tetracyclines & Related Rash    DISCHARGE MEDICATIONS:   Allergies as of 01/26/2020      Reactions   Lasix [furosemide]    Numbness and tingling of the face    Ultram [tramadol] Other (See Comments)   Pt reports, "makes my head feel funny."   Erythromycin Rash   Plasticized Base [plastibase] Rash   Tetracyclines & Related Rash      Medication List    TAKE these medications  acetaminophen 500 MG tablet Commonly known as: TYLENOL Take 500 mg by mouth 3 (three) times daily.   benztropine 1 MG tablet Commonly known as: COGENTIN Take 1 mg by mouth 2 (two) times daily.   bisacodyl 5 MG EC tablet Commonly known as: DULCOLAX Take 10 mg by mouth daily as needed for moderate constipation or severe constipation.   busPIRone 10 MG tablet Commonly known as: BUSPAR Take 10 mg by mouth 3 (three) times daily.   Bydureon  BCise 2 MG/0.85ML Auij Generic drug: Exenatide ER Inject 2 mg into the skin every Wednesday.   clobetasol cream 0.05 % Commonly known as: TEMOVATE Apply 1 application topically in the morning and at bedtime. (apply to scalp, legs and arms)   clopidogrel 75 MG tablet Commonly known as: Plavix Take 1 tablet (75 mg total) by mouth daily.   Dermacloud Oint Apply 1 application topically daily.   docusate sodium 100 MG capsule Commonly known as: COLACE Take 100 mg by mouth daily. What changed: Another medication with the same name was removed. Continue taking this medication, and follow the directions you see here.   empagliflozin 25 MG Tabs tablet Commonly known as: Jardiance Take 1 tablet (25 mg total) by mouth daily before breakfast.   ethacrynic acid 25 MG tablet Commonly known as: EDECRIN Take 3 tablets (75 mg total) by mouth 2 (two) times daily.   fluticasone 50 MCG/ACT nasal spray Commonly known as: FLONASE Place 1 spray into both nostrils every 12 (twelve) hours as needed for allergies.   HumaLOG 100 UNIT/ML cartridge Generic drug: insulin lispro Inject 5 Units into the skin See admin instructions. Inject 5u under the skin three times daily with meals with sliding scale   HYDROcodone-acetaminophen 5-325 MG tablet Commonly known as: NORCO/VICODIN Take 1 tablet by mouth every 6 (six) hours as needed for up to 10 doses for moderate pain or severe pain.   insulin glargine 100 UNIT/ML injection Commonly known as: LANTUS Inject 0.4 mLs (40 Units total) into the skin daily. What changed: how much to take   ipratropium-albuterol 0.5-2.5 (3) MG/3ML Soln Commonly known as: DUONEB Take 3 mLs by nebulization every 4 (four) hours as needed (congestion).   nabumetone 500 MG tablet Commonly known as: RELAFEN Take 500 mg by mouth daily.   nitroGLYCERIN 0.4 MG SL tablet Commonly known as: NITROSTAT Place 0.4 mg under the tongue every 5 (five) minutes as needed for chest  pain.   nystatin cream Commonly known as: MYCOSTATIN Apply 1 application topically 2 (two) times daily. (apply to perennial area) What changed: Another medication with the same name was removed. Continue taking this medication, and follow the directions you see here.   omeprazole 20 MG capsule Commonly known as: PRILOSEC Take 20 mg by mouth every other day.   polyethylene glycol 17 g packet Commonly known as: MIRALAX / GLYCOLAX Take 17 g by mouth daily.   sodium chloride 0.65 % Soln nasal spray Commonly known as: OCEAN Place 2 sprays into both nostrils every 8 (eight) hours as needed for congestion.   vitamin B-12 1000 MCG tablet Commonly known as: CYANOCOBALAMIN Take 1,000 mcg by mouth every Monday, Wednesday, and Friday.            Discharge Care Instructions  (From admission, onward)         Start     Ordered   01/26/20 0000  Discharge wound care:       Comments: Low air loss mattress replacement.  Gerhardts butt  paste to sacrum and buttocks.  Cleanse wounds with NS and pat dry. Apply santyl to open wound.  Cover with NS moist gauze. Secure with dry gauze and kerlix/tape.  Change daily.   Use foam padding if needed  Patient will benefit from Wound clinic ro Wound/Ostomy Nurse evaluation at [facility]   01/26/20 1105          If you experience worsening of your admission symptoms, develop shortness of breath, life threatening emergency, suicidal or homicidal thoughts you must seek medical attention immediately by calling 911 or calling your MD immediately  if symptoms less severe.  You Must read complete instructions/literature along with all the possible adverse reactions/side effects for all the Medicines you take and that have been prescribed to you. Take any new Medicines after you have completely understood and accept all the possible adverse reactions/side effects.   Please note  You were cared for by a hospitalist during your hospital stay. If you have any  questions about your discharge medications or the care you received while you were in the hospital after you are discharged, you can call the unit and asked to speak with the hospitalist on call if the hospitalist that took care of you is not available. Once you are discharged, your primary care physician will handle any further medical issues. Please note that NO REFILLS for any discharge medications will be authorized once you are discharged, as it is imperative that you return to your primary care physician (or establish a relationship with a primary care physician if you do not have one) for your aftercare needs so that they can reassess your need for medications and monitor your lab values. Today   SUBJECTIVE  significant pain with clean up of wounds today. No blood noted in stool. Appears to have one oozing ulcer with bright red blood. Stopped after applying pressure and cleaning up   VITAL SIGNS:  Blood pressure 121/62, pulse 97, temperature 98.3 F (36.8 C), temperature source Oral, resp. rate 16, height 5\' 6"  (1.676 m), weight 123 kg, SpO2 100 %.  I/O:    Intake/Output Summary (Last 24 hours) at 01/26/2020 1120 Last data filed at 01/26/2020 0530 Gross per 24 hour  Intake --  Output 250 ml  Net -250 ml    PHYSICAL EXAMINATION:  Constitutional: appears older than chronological age, morbid obesity Respiratory: clear to auscultation bilaterally, no wheezing, no crackles. Normal respiratory effort. No accessory muscle use.  Cardiovascular: Regular rate and rhythm, no murmurs / rubs / gallops. No extremity edema. 2+ pedal pulses. No carotid bruits.  Abdomen: morbidly obese abdomen, no tenderness, no masses palpated, no hepatosplenomegaly. Bowel sounds positive.  extremities. Good ROM, no contractures, no atrophy. Normal muscle tone.  Skin:      Neurologic: Sensation intact. Strength 5/5 in all 4.  Psychiatric: Normal judgment and insight. Alert and oriented x 3. Normal  mood  DATA REVIEW:   CBC  Recent Labs  Lab 01/25/20 0526  WBC 12.3*  HGB 8.1*  HCT 26.5*  PLT 331    Chemistries  Recent Labs  Lab 01/25/20 0247 01/25/20 0526  NA 137 139  K 4.7 4.6  CL 103 105  CO2 21* 20*  GLUCOSE 171* 149*  BUN 70* 70*  CREATININE 2.95* 2.86*  CALCIUM 8.4* 8.0*  AST 18  --   ALT 12  --   ALKPHOS 102  --   BILITOT 0.7  --     Microbiology Results   Recent Results (from  the past 240 hour(s))  Resp Panel by RT-PCR (Flu A&B, Covid) Nasopharyngeal Swab     Status: None   Collection Time: 01/16/20 10:33 PM   Specimen: Nasopharyngeal Swab; Nasopharyngeal(NP) swabs in vial transport medium  Result Value Ref Range Status   SARS Coronavirus 2 by RT PCR NEGATIVE NEGATIVE Final    Comment: (NOTE) SARS-CoV-2 target nucleic acids are NOT DETECTED.  The SARS-CoV-2 RNA is generally detectable in upper respiratory specimens during the acute phase of infection. The lowest concentration of SARS-CoV-2 viral copies this assay can detect is 138 copies/mL. A negative result does not preclude SARS-Cov-2 infection and should not be used as the sole basis for treatment or other patient management decisions. A negative result may occur with  improper specimen collection/handling, submission of specimen other than nasopharyngeal swab, presence of viral mutation(s) within the areas targeted by this assay, and inadequate number of viral copies(<138 copies/mL). A negative result must be combined with clinical observations, patient history, and epidemiological information. The expected result is Negative.  Fact Sheet for Patients:  EntrepreneurPulse.com.au  Fact Sheet for Healthcare Providers:  IncredibleEmployment.be  This test is no t yet approved or cleared by the [place] FDA and  has been authorized for detection and/or diagnosis of SARS-CoV-2 by FDA under an Emergency Use Authorization (EUA). This EUA will remain  in  effect (meaning this test can be used) for the duration of the COVID-19 declaration under Section 564(b)(1) of the Act, 21 U.S.C.section 360bbb-3(b)(1), unless the authorization is terminated  or revoked sooner.       Influenza A by PCR NEGATIVE NEGATIVE Final   Influenza B by PCR NEGATIVE NEGATIVE Final    Comment: (NOTE) The Xpert Xpress SARS-CoV-2/FLU/RSV plus assay is intended as an aid in the diagnosis of influenza from Nasopharyngeal swab specimens and should not be used as a sole basis for treatment. Nasal washings and aspirates are unacceptable for Xpert Xpress SARS-CoV-2/FLU/RSV testing.  Fact Sheet for Patients: EntrepreneurPulse.com.au  Fact Sheet for Healthcare Providers: IncredibleEmployment.be  This test is not yet approved or cleared by the [place] FDA and has been authorized for detection and/or diagnosis of SARS-CoV-2 by FDA under an Emergency Use Authorization (EUA). This EUA will remain in effect (meaning this test can be used) for the duration of the COVID-19 declaration under Section 564(b)(1) of the Act, 21 U.S.C. section 360bbb-3(b)(1), unless the authorization is terminated or revoked.  Performed at [facility], [address] [place], [place] [address]   Culture, blood (routine x 2)     Status: None   Collection Time: 01/17/20 12:30 AM   Specimen: BLOOD  Result Value Ref Range Status   Specimen Description BLOOD RIGHT ANTECUBITAL  Final   Special Requests   Final    BOTTLES DRAWN AEROBIC AND ANAEROBIC Blood Culture adequate volume   Culture   Final    NO GROWTH 5 DAYS Performed at [facility], [address] [place], [place] [address]    Report Status 01/22/2020 FINAL  Final  Culture, blood (routine x 2)     Status: None   Collection Time: 01/17/20 12:30 AM   Specimen: BLOOD  Result Value Ref Range Status   Specimen Description   Final    BLOOD Blood Culture results may not be  optimal due to an excessive volume of blood received in culture bottles   Special Requests   Final    BOTTLES DRAWN AEROBIC AND ANAEROBIC LEFT ANTECUBITAL   Culture  Final    NO GROWTH 5 DAYS Performed at [facility], [address] [place], [place] [address]    Report Status 01/22/2020 FINAL  Final  C Difficile Quick Screen w PCR reflex     Status: None   Collection Time: 01/17/20 12:30 AM   Specimen: STOOL  Result Value Ref Range Status   C Diff antigen NEGATIVE NEGATIVE Final   C Diff toxin NEGATIVE NEGATIVE Final   C Diff interpretation No C. difficile detected.  Final    Comment: Performed at [facility], [address] [place], [place] [address]  Gastrointestinal Panel by PCR , Stool     Status: None   Collection Time: 01/17/20 12:30 AM   Specimen: Stool  Result Value Ref Range Status   Campylobacter species NOT DETECTED NOT DETECTED Final   Plesimonas shigelloides NOT DETECTED NOT DETECTED Final   Salmonella species NOT DETECTED NOT DETECTED Final   Yersinia enterocolitica NOT DETECTED NOT DETECTED Final   Vibrio species NOT DETECTED NOT DETECTED Final   Vibrio cholerae NOT DETECTED NOT DETECTED Final   Enteroaggregative E coli (EAEC) NOT DETECTED NOT DETECTED Final   Enteropathogenic E coli (EPEC) NOT DETECTED NOT DETECTED Final   Enterotoxigenic E coli (ETEC) NOT DETECTED NOT DETECTED Final   Shiga like toxin producing E coli (STEC) NOT DETECTED NOT DETECTED Final   Shigella/Enteroinvasive E coli (EIEC) NOT DETECTED NOT DETECTED Final   Cryptosporidium NOT DETECTED NOT DETECTED Final   Cyclospora cayetanensis NOT DETECTED NOT DETECTED Final   Entamoeba histolytica NOT DETECTED NOT DETECTED Final   Giardia lamblia NOT DETECTED NOT DETECTED Final   Adenovirus F40/41 NOT DETECTED NOT DETECTED Final   Astrovirus NOT DETECTED NOT DETECTED Final   Norovirus GI/GII NOT DETECTED NOT DETECTED Final   Rotavirus A NOT DETECTED NOT DETECTED Final    Sapovirus (I, II, IV, and V) NOT DETECTED NOT DETECTED Final    Comment: Performed at [facility], [address] [place], [place] [address]  C Difficile Quick Screen w PCR reflex     Status: None   Collection Time: 01/17/20 10:55 AM   Specimen: STOOL  Result Value Ref Range Status   C Diff antigen NEGATIVE NEGATIVE Final   C Diff toxin NEGATIVE NEGATIVE Final   C Diff interpretation No C. difficile detected.  Final    Comment: Performed at [facility], [address] [place], [place] [address]  Urine Culture     Status: Abnormal   Collection Time: 01/17/20  3:18 PM   Specimen: Urine, Clean Catch  Result Value Ref Range Status   Specimen Description   Final    URINE, CLEAN CATCH Performed at [facility], [address] [place], [place] [address]    Special Requests   Final    NONE Performed at [facility], [place], [place] [address]    Culture >=100,000 COLONIES/mL ESCHERICHIA COLI (A)  Final   Report Status 01/21/2020 FINAL  Final   Organism ID, Bacteria ESCHERICHIA COLI (A)  Final      Susceptibility   Escherichia coli - MIC*    AMPICILLIN <=2 SENSITIVE Sensitive     CEFAZOLIN <=4 SENSITIVE Sensitive     CEFEPIME <=0.12 SENSITIVE Sensitive     CEFTRIAXONE <=0.25 SENSITIVE Sensitive     CIPROFLOXACIN <=0.25 SENSITIVE Sensitive     GENTAMICIN <=1 SENSITIVE Sensitive     IMIPENEM <=0.25 SENSITIVE Sensitive     NITROFURANTOIN <=16 SENSITIVE  Sensitive     TRIMETH/SULFA <=20 SENSITIVE Sensitive     AMPICILLIN/SULBACTAM <=2 SENSITIVE Sensitive     PIP/TAZO <=4 SENSITIVE Sensitive     * >=100,000 COLONIES/mL ESCHERICHIA COLI  Resp Panel by RT-PCR (Flu A&B, Covid) Nasopharyngeal Swab     Status: None   Collection Time: 01/19/20 11:40 AM   Specimen: Nasopharyngeal Swab; Nasopharyngeal(NP) swabs in vial transport medium  Result Value Ref Range Status   SARS Coronavirus 2 by RT PCR NEGATIVE NEGATIVE Final    Comment:  (NOTE) SARS-CoV-2 target nucleic acids are NOT DETECTED.  The SARS-CoV-2 RNA is generally detectable in upper respiratory specimens during the acute phase of infection. The lowest concentration of SARS-CoV-2 viral copies this assay can detect is 138 copies/mL. A negative result does not preclude SARS-Cov-2 infection and should not be used as the sole basis for treatment or other patient management decisions. A negative result may occur with  improper specimen collection/handling, submission of specimen other than nasopharyngeal swab, presence of viral mutation(s) within the areas targeted by this assay, and inadequate number of viral copies(<138 copies/mL). A negative result must be combined with clinical observations, patient history, and epidemiological information. The expected result is Negative.  Fact Sheet for Patients:  EntrepreneurPulse.com.au  Fact Sheet for Healthcare Providers:  IncredibleEmployment.be  This test is no t yet approved or cleared by the [place] FDA and  has been authorized for detection and/or diagnosis of SARS-CoV-2 by FDA under an Emergency Use Authorization (EUA). This EUA will remain  in effect (meaning this test can be used) for the duration of the COVID-19 declaration under Section 564(b)(1) of the Act, 21 U.S.C.section 360bbb-3(b)(1), unless the authorization is terminated  or revoked sooner.       Influenza A by PCR NEGATIVE NEGATIVE Final   Influenza B by PCR NEGATIVE NEGATIVE Final    Comment: (NOTE) The Xpert Xpress SARS-CoV-2/FLU/RSV plus assay is intended as an aid in the diagnosis of influenza from Nasopharyngeal swab specimens and should not be used as a sole basis for treatment. Nasal washings and aspirates are unacceptable for Xpert Xpress SARS-CoV-2/FLU/RSV testing.  Fact Sheet for Patients: EntrepreneurPulse.com.au  Fact Sheet for Healthcare  Providers: IncredibleEmployment.be  This test is not yet approved or cleared by the [place] FDA and has been authorized for detection and/or diagnosis of SARS-CoV-2 by FDA under an Emergency Use Authorization (EUA). This EUA will remain in effect (meaning this test can be used) for the duration of the COVID-19 declaration under Section 564(b)(1) of the Act, 21 U.S.C. section 360bbb-3(b)(1), unless the authorization is terminated or revoked.  Performed at [facility], [address] [place], [place] [address]   Resp Panel by RT-PCR (Flu A&B, Covid)     Status: None   Collection Time: 01/25/20  2:48 AM  Result Value Ref Range Status   SARS Coronavirus 2 by RT PCR NEGATIVE NEGATIVE Final    Comment: (NOTE) SARS-CoV-2 target nucleic acids are NOT DETECTED.  The SARS-CoV-2 RNA is generally detectable in upper respiratory specimens during the acute phase of infection. The lowest concentration of SARS-CoV-2 viral copies this assay can detect is 138 copies/mL. A negative result does not preclude SARS-Cov-2 infection and should not be used as the sole basis for treatment or other patient management decisions. A negative result may occur with  improper specimen collection/handling, submission of specimen other than nasopharyngeal swab, presence of viral mutation(s) within the areas targeted by this assay, and inadequate number of viral copies(<138 copies/mL).  A negative result must be combined with clinical observations, patient history, and epidemiological information. The expected result is Negative.  Fact Sheet for Patients:  EntrepreneurPulse.com.au  Fact Sheet for Healthcare Providers:  IncredibleEmployment.be  This test is no t yet approved or cleared by the [place] FDA and  has been authorized for detection and/or diagnosis of SARS-CoV-2 by FDA under an Emergency Use Authorization (EUA). This EUA will  remain  in effect (meaning this test can be used) for the duration of the COVID-19 declaration under Section 564(b)(1) of the Act, 21 U.S.C.section 360bbb-3(b)(1), unless the authorization is terminated  or revoked sooner.       Influenza A by PCR NEGATIVE NEGATIVE Final   Influenza B by PCR NEGATIVE NEGATIVE Final    Comment: (NOTE) The Xpert Xpress SARS-CoV-2/FLU/RSV plus assay is intended as an aid in the diagnosis of influenza from Nasopharyngeal swab specimens and should not be used as a sole basis for treatment. Nasal washings and aspirates are unacceptable for Xpert Xpress SARS-CoV-2/FLU/RSV testing.  Fact Sheet for Patients: EntrepreneurPulse.com.au  Fact Sheet for Healthcare Providers: IncredibleEmployment.be  This test is not yet approved or cleared by the [place] FDA and has been authorized for detection and/or diagnosis of SARS-CoV-2 by FDA under an Emergency Use Authorization (EUA). This EUA will remain in effect (meaning this test can be used) for the duration of the COVID-19 declaration under Section 564(b)(1) of the Act, 21 U.S.C. section 360bbb-3(b)(1), unless the authorization is terminated or revoked.  Performed at [facility], [address] [place], [place] [address]     RADIOLOGY:  [facility] Chest Port 1 View  Result Date: 01/25/2020 CLINICAL DATA:  Bright red rectal bleeding. EXAM: PORTABLE CHEST 1 VIEW COMPARISON:  January 17, 2020 FINDINGS: The heart size and mediastinal contours are within normal limits. Both lungs are clear. Degenerative changes are seen involving the bilateral shoulders and thoracic spine. IMPRESSION: No active disease. Electronically Signed   By: Virgina Norfolk M.D.   On: 01/25/2020 03:17     CODE STATUS:     Code Status Orders  (From admission, onward)         Start     Ordered   01/25/20 0455  Full code  Continuous        01/25/20 0457        Code Status History     Date Active Date Inactive Code Status Order ID Comments User Context   01/17/2020 0127 01/20/2020 1855 Full Code [id number]  Athena Masse, MD ED   08/13/2019 2310 08/15/2019 1934 Full Code [id number]  Orene Desanctis, DO ED   04/29/2016 1317 05/04/2016 1910 Full Code [id number]  Loletha Grayer, MD ED   10/02/2015 0020 10/04/2015 1945 Full Code [id number]  Lance Coon, MD Inpatient   07/21/2015 0341 07/27/2015 2021 Full Code [id number]  Saundra Shelling, MD Inpatient   Advance Care Planning Activity    Advance Directive Documentation   Flowsheet Row Most Recent Value  Type of Advance Directive Healthcare Power of Attorney  Pre-existing out of facility DNR order (yellow form or pink MOST form) --  "MOST" Form in Place? --       TOTAL TIME TAKING CARE OF THIS PATIENT: *35* minutes.    Fritzi Mandes M.D  [facility]    CC: Primary care physician; System, Provider Not In

## 2020-01-26 NOTE — Progress Notes (Signed)
EMS here to transport patient. VS stable. IV removed

## 2020-02-04 ENCOUNTER — Emergency Department: Payer: Medicare PPO

## 2020-02-04 ENCOUNTER — Other Ambulatory Visit: Payer: Self-pay

## 2020-02-04 ENCOUNTER — Inpatient Hospital Stay (HOSPITAL_COMMUNITY)
Admit: 2020-02-04 | Discharge: 2020-02-04 | Disposition: A | Discharge status: Home / Self Care | Payer: Medicare PPO | Attending: Pulmonary Disease | Admitting: Pulmonary Disease

## 2020-02-04 ENCOUNTER — Inpatient Hospital Stay
Admission: EM | Admit: 2020-02-04 | Discharge: 2020-02-08 | DRG: 871 | Disposition: E | Payer: Medicare PPO | Attending: Pulmonary Disease | Admitting: Pulmonary Disease

## 2020-02-04 ENCOUNTER — Inpatient Hospital Stay: Payer: Medicare PPO

## 2020-02-04 DIAGNOSIS — A419 Sepsis, unspecified organism: Secondary | ICD-10-CM | POA: Diagnosis present

## 2020-02-04 DIAGNOSIS — I21A1 Myocardial infarction type 2: Secondary | ICD-10-CM | POA: Diagnosis present

## 2020-02-04 DIAGNOSIS — Z6841 Body Mass Index (BMI) 40.0 and over, adult: Secondary | ICD-10-CM

## 2020-02-04 DIAGNOSIS — Z01818 Encounter for other preprocedural examination: Secondary | ICD-10-CM

## 2020-02-04 DIAGNOSIS — I348 Other nonrheumatic mitral valve disorders: Secondary | ICD-10-CM

## 2020-02-04 DIAGNOSIS — E872 Acidosis, unspecified: Secondary | ICD-10-CM

## 2020-02-04 DIAGNOSIS — Z881 Allergy status to other antibiotic agents status: Secondary | ICD-10-CM

## 2020-02-04 DIAGNOSIS — Z79899 Other long term (current) drug therapy: Secondary | ICD-10-CM

## 2020-02-04 DIAGNOSIS — L89312 Pressure ulcer of right buttock, stage 2: Secondary | ICD-10-CM | POA: Diagnosis present

## 2020-02-04 DIAGNOSIS — Z794 Long term (current) use of insulin: Secondary | ICD-10-CM

## 2020-02-04 DIAGNOSIS — L89153 Pressure ulcer of sacral region, stage 3: Secondary | ICD-10-CM | POA: Diagnosis present

## 2020-02-04 DIAGNOSIS — Z7902 Long term (current) use of antithrombotics/antiplatelets: Secondary | ICD-10-CM

## 2020-02-04 DIAGNOSIS — R34 Anuria and oliguria: Secondary | ICD-10-CM | POA: Diagnosis not present

## 2020-02-04 DIAGNOSIS — N170 Acute kidney failure with tubular necrosis: Secondary | ICD-10-CM | POA: Diagnosis present

## 2020-02-04 DIAGNOSIS — Z452 Encounter for adjustment and management of vascular access device: Secondary | ICD-10-CM

## 2020-02-04 DIAGNOSIS — A4181 Sepsis due to Enterococcus: Principal | ICD-10-CM | POA: Diagnosis present

## 2020-02-04 DIAGNOSIS — Z515 Encounter for palliative care: Secondary | ICD-10-CM

## 2020-02-04 DIAGNOSIS — G4733 Obstructive sleep apnea (adult) (pediatric): Secondary | ICD-10-CM | POA: Diagnosis present

## 2020-02-04 DIAGNOSIS — I358 Other nonrheumatic aortic valve disorders: Secondary | ICD-10-CM

## 2020-02-04 DIAGNOSIS — R410 Disorientation, unspecified: Secondary | ICD-10-CM

## 2020-02-04 DIAGNOSIS — J449 Chronic obstructive pulmonary disease, unspecified: Secondary | ICD-10-CM | POA: Diagnosis present

## 2020-02-04 DIAGNOSIS — Z1622 Resistance to vancomycin related antibiotics: Secondary | ICD-10-CM | POA: Diagnosis present

## 2020-02-04 DIAGNOSIS — Z4659 Encounter for fitting and adjustment of other gastrointestinal appliance and device: Secondary | ICD-10-CM

## 2020-02-04 DIAGNOSIS — I5042 Chronic combined systolic (congestive) and diastolic (congestive) heart failure: Secondary | ICD-10-CM | POA: Diagnosis present

## 2020-02-04 DIAGNOSIS — I517 Cardiomegaly: Secondary | ICD-10-CM | POA: Diagnosis not present

## 2020-02-04 DIAGNOSIS — R6521 Severe sepsis with septic shock: Secondary | ICD-10-CM | POA: Diagnosis present

## 2020-02-04 DIAGNOSIS — R319 Hematuria, unspecified: Secondary | ICD-10-CM | POA: Diagnosis present

## 2020-02-04 DIAGNOSIS — L03116 Cellulitis of left lower limb: Secondary | ICD-10-CM | POA: Diagnosis present

## 2020-02-04 DIAGNOSIS — E1122 Type 2 diabetes mellitus with diabetic chronic kidney disease: Secondary | ICD-10-CM | POA: Diagnosis present

## 2020-02-04 DIAGNOSIS — J9611 Chronic respiratory failure with hypoxia: Secondary | ICD-10-CM | POA: Diagnosis present

## 2020-02-04 DIAGNOSIS — Z20822 Contact with and (suspected) exposure to covid-19: Secondary | ICD-10-CM | POA: Diagnosis present

## 2020-02-04 DIAGNOSIS — I130 Hypertensive heart and chronic kidney disease with heart failure and stage 1 through stage 4 chronic kidney disease, or unspecified chronic kidney disease: Secondary | ICD-10-CM | POA: Diagnosis present

## 2020-02-04 DIAGNOSIS — N189 Chronic kidney disease, unspecified: Secondary | ICD-10-CM

## 2020-02-04 DIAGNOSIS — D631 Anemia in chronic kidney disease: Secondary | ICD-10-CM | POA: Diagnosis present

## 2020-02-04 DIAGNOSIS — M1990 Unspecified osteoarthritis, unspecified site: Secondary | ICD-10-CM | POA: Diagnosis present

## 2020-02-04 DIAGNOSIS — E875 Hyperkalemia: Secondary | ICD-10-CM | POA: Diagnosis present

## 2020-02-04 DIAGNOSIS — Z8249 Family history of ischemic heart disease and other diseases of the circulatory system: Secondary | ICD-10-CM

## 2020-02-04 DIAGNOSIS — N390 Urinary tract infection, site not specified: Secondary | ICD-10-CM | POA: Diagnosis present

## 2020-02-04 DIAGNOSIS — B9689 Other specified bacterial agents as the cause of diseases classified elsewhere: Secondary | ICD-10-CM | POA: Diagnosis present

## 2020-02-04 DIAGNOSIS — R4182 Altered mental status, unspecified: Secondary | ICD-10-CM | POA: Diagnosis present

## 2020-02-04 DIAGNOSIS — Z66 Do not resuscitate: Secondary | ICD-10-CM | POA: Diagnosis not present

## 2020-02-04 DIAGNOSIS — Z833 Family history of diabetes mellitus: Secondary | ICD-10-CM

## 2020-02-04 DIAGNOSIS — N184 Chronic kidney disease, stage 4 (severe): Secondary | ICD-10-CM | POA: Diagnosis present

## 2020-02-04 DIAGNOSIS — N179 Acute kidney failure, unspecified: Secondary | ICD-10-CM

## 2020-02-04 DIAGNOSIS — I469 Cardiac arrest, cause unspecified: Secondary | ICD-10-CM | POA: Diagnosis not present

## 2020-02-04 DIAGNOSIS — Z888 Allergy status to other drugs, medicaments and biological substances status: Secondary | ICD-10-CM

## 2020-02-04 DIAGNOSIS — Z9981 Dependence on supplemental oxygen: Secondary | ICD-10-CM | POA: Diagnosis not present

## 2020-02-04 DIAGNOSIS — R7989 Other specified abnormal findings of blood chemistry: Secondary | ICD-10-CM

## 2020-02-04 DIAGNOSIS — L89322 Pressure ulcer of left buttock, stage 2: Secondary | ICD-10-CM | POA: Diagnosis present

## 2020-02-04 DIAGNOSIS — L8990 Pressure ulcer of unspecified site, unspecified stage: Secondary | ICD-10-CM | POA: Insufficient documentation

## 2020-02-04 DIAGNOSIS — L89892 Pressure ulcer of other site, stage 2: Secondary | ICD-10-CM | POA: Diagnosis present

## 2020-02-04 DIAGNOSIS — Z85828 Personal history of other malignant neoplasm of skin: Secondary | ICD-10-CM

## 2020-02-04 LAB — RENAL FUNCTION PANEL
Albumin: 1.6 g/dL — ABNORMAL LOW (ref 3.5–5.0)
Anion gap: 21 — ABNORMAL HIGH (ref 5–15)
BUN: 60 mg/dL — ABNORMAL HIGH (ref 8–23)
CO2: 14 mmol/L — ABNORMAL LOW (ref 22–32)
Calcium: 8.5 mg/dL — ABNORMAL LOW (ref 8.9–10.3)
Chloride: 102 mmol/L (ref 98–111)
Creatinine, Ser: 4.44 mg/dL — ABNORMAL HIGH (ref 0.44–1.00)
GFR, Estimated: 10 mL/min — ABNORMAL LOW (ref >60)
Glucose, Bld: 232 mg/dL — ABNORMAL HIGH (ref 70–99)
Phosphorus: 4.4 mg/dL (ref 2.5–4.6)
Potassium: 4.5 mmol/L (ref 3.5–5.1)
Sodium: 137 mmol/L (ref 135–145)

## 2020-02-04 LAB — COMPREHENSIVE METABOLIC PANEL
ALT: 17 U/L (ref 0–44)
AST: 41 U/L (ref 15–41)
Albumin: 1.9 g/dL — ABNORMAL LOW (ref 3.5–5.0)
Alkaline Phosphatase: 176 U/L — ABNORMAL HIGH (ref 38–126)
Anion gap: 20 — ABNORMAL HIGH (ref 5–15)
BUN: 64 mg/dL — ABNORMAL HIGH (ref 8–23)
CO2: 14 mmol/L — ABNORMAL LOW (ref 22–32)
Calcium: 9.3 mg/dL (ref 8.9–10.3)
Chloride: 104 mmol/L (ref 98–111)
Creatinine, Ser: 4.48 mg/dL — ABNORMAL HIGH (ref 0.44–1.00)
GFR, Estimated: 10 mL/min — ABNORMAL LOW (ref >60)
Glucose, Bld: 265 mg/dL — ABNORMAL HIGH (ref 70–99)
Potassium: 5.5 mmol/L — ABNORMAL HIGH (ref 3.5–5.1)
Sodium: 138 mmol/L (ref 135–145)
Total Bilirubin: 0.8 mg/dL (ref 0.3–1.2)
Total Protein: 6.9 g/dL (ref 6.5–8.1)

## 2020-02-04 LAB — TROPONIN I (HIGH SENSITIVITY)
Troponin I (High Sensitivity): 61 ng/L — ABNORMAL HIGH (ref <18)
Troponin I (High Sensitivity): 441 ng/L (ref <18)

## 2020-02-04 LAB — BLOOD CULTURE ID PANEL (REFLEXED) - BCID2
A.calcoaceticus-baumannii: NOT DETECTED
A.calcoaceticus-baumannii: NOT DETECTED
Bacteroides fragilis: NOT DETECTED
Bacteroides fragilis: NOT DETECTED
CTX-M ESBL: NOT DETECTED
Candida albicans: NOT DETECTED
Candida albicans: NOT DETECTED
Candida auris: NOT DETECTED
Candida auris: NOT DETECTED
Candida glabrata: NOT DETECTED
Candida glabrata: NOT DETECTED
Candida krusei: NOT DETECTED
Candida krusei: NOT DETECTED
Candida parapsilosis: NOT DETECTED
Candida parapsilosis: NOT DETECTED
Candida tropicalis: NOT DETECTED
Candida tropicalis: NOT DETECTED
Carbapenem resist OXA 48 LIKE: NOT DETECTED
Carbapenem resistance IMP: NOT DETECTED
Carbapenem resistance KPC: NOT DETECTED
Carbapenem resistance NDM: NOT DETECTED
Carbapenem resistance VIM: NOT DETECTED
Cryptococcus neoformans/gattii: NOT DETECTED
Cryptococcus neoformans/gattii: NOT DETECTED
Enterobacter cloacae complex: NOT DETECTED
Enterobacter cloacae complex: NOT DETECTED
Enterobacterales: DETECTED — AB
Enterobacterales: NOT DETECTED
Enterococcus Faecium: NOT DETECTED
Enterococcus Faecium: NOT DETECTED
Enterococcus faecalis: DETECTED — AB
Enterococcus faecalis: NOT DETECTED
Escherichia coli: NOT DETECTED
Escherichia coli: NOT DETECTED
Haemophilus influenzae: NOT DETECTED
Haemophilus influenzae: NOT DETECTED
Klebsiella aerogenes: NOT DETECTED
Klebsiella aerogenes: NOT DETECTED
Klebsiella oxytoca: NOT DETECTED
Klebsiella oxytoca: NOT DETECTED
Klebsiella pneumoniae: NOT DETECTED
Klebsiella pneumoniae: NOT DETECTED
Listeria monocytogenes: NOT DETECTED
Listeria monocytogenes: NOT DETECTED
Neisseria meningitidis: NOT DETECTED
Neisseria meningitidis: NOT DETECTED
Proteus species: NOT DETECTED
Proteus species: NOT DETECTED
Pseudomonas aeruginosa: NOT DETECTED
Pseudomonas aeruginosa: NOT DETECTED
Salmonella species: NOT DETECTED
Salmonella species: NOT DETECTED
Serratia marcescens: NOT DETECTED
Serratia marcescens: NOT DETECTED
Staphylococcus aureus (BCID): NOT DETECTED
Staphylococcus aureus (BCID): NOT DETECTED
Staphylococcus epidermidis: NOT DETECTED
Staphylococcus epidermidis: NOT DETECTED
Staphylococcus lugdunensis: NOT DETECTED
Staphylococcus lugdunensis: NOT DETECTED
Staphylococcus species: NOT DETECTED
Staphylococcus species: NOT DETECTED
Stenotrophomonas maltophilia: NOT DETECTED
Stenotrophomonas maltophilia: NOT DETECTED
Streptococcus agalactiae: NOT DETECTED
Streptococcus agalactiae: NOT DETECTED
Streptococcus pneumoniae: NOT DETECTED
Streptococcus pneumoniae: NOT DETECTED
Streptococcus pyogenes: NOT DETECTED
Streptococcus pyogenes: NOT DETECTED
Streptococcus species: NOT DETECTED
Streptococcus species: NOT DETECTED
Vancomycin resistance: NOT DETECTED

## 2020-02-04 LAB — LACTIC ACID, PLASMA
Lactic Acid, Venous: 9.2 mmol/L (ref 0.5–1.9)
Lactic Acid, Venous: 10.7 mmol/L (ref 0.5–1.9)
Lactic Acid, Venous: 10.5 mmol/L (ref 0.5–1.9)
Lactic Acid, Venous: 9.6 mmol/L (ref 0.5–1.9)
Lactic Acid, Venous: 7.9 mmol/L (ref 0.5–1.9)

## 2020-02-04 LAB — BLOOD GAS, VENOUS
Acid-base deficit: 17.3 mmol/L — ABNORMAL HIGH (ref 0.0–2.0)
Bicarbonate: 11.1 mmol/L — ABNORMAL LOW (ref 20.0–28.0)
O2 Saturation: 34.7 %
Patient temperature: 37
pCO2, Ven: 35 mmHg — ABNORMAL LOW (ref 44.0–60.0)
pH, Ven: 7.11 — CL (ref 7.250–7.430)
pO2, Ven: <31 mmHg — CL (ref 32.0–45.0)

## 2020-02-04 LAB — CBC WITH DIFFERENTIAL/PLATELET
Abs Immature Granulocytes: 0.2 10*3/uL — ABNORMAL HIGH (ref 0.00–0.07)
Basophils Absolute: 0 10*3/uL (ref 0.0–0.1)
Basophils Relative: 0 %
Eosinophils Absolute: 0 10*3/uL (ref 0.0–0.5)
Eosinophils Relative: 0 %
HCT: 32.5 % — ABNORMAL LOW (ref 36.0–46.0)
Hemoglobin: 9.6 g/dL — ABNORMAL LOW (ref 12.0–15.0)
Immature Granulocytes: 2 %
Lymphocytes Relative: 10 %
Lymphs Abs: 1.1 10*3/uL (ref 0.7–4.0)
MCH: 31.3 pg (ref 26.0–34.0)
MCHC: 29.5 g/dL — ABNORMAL LOW (ref 30.0–36.0)
MCV: 105.9 fL — ABNORMAL HIGH (ref 80.0–100.0)
Monocytes Absolute: 0.9 10*3/uL (ref 0.1–1.0)
Monocytes Relative: 8 %
Neutro Abs: 9.4 10*3/uL — ABNORMAL HIGH (ref 1.7–7.7)
Neutrophils Relative %: 80 %
Platelets: 323 10*3/uL (ref 150–400)
RBC: 3.07 MIL/uL — ABNORMAL LOW (ref 3.87–5.11)
RDW: 19.5 % — ABNORMAL HIGH (ref 11.5–15.5)
Smear Review: NORMAL
WBC: 11.7 10*3/uL — ABNORMAL HIGH (ref 4.0–10.5)
nRBC: 0.9 % — ABNORMAL HIGH (ref 0.0–0.2)

## 2020-02-04 LAB — GLUCOSE, CAPILLARY
Glucose-Capillary: 114 mg/dL — ABNORMAL HIGH (ref 70–99)
Glucose-Capillary: 133 mg/dL — ABNORMAL HIGH (ref 70–99)
Glucose-Capillary: 153 mg/dL — ABNORMAL HIGH (ref 70–99)
Glucose-Capillary: 220 mg/dL — ABNORMAL HIGH (ref 70–99)
Glucose-Capillary: 249 mg/dL — ABNORMAL HIGH (ref 70–99)
Glucose-Capillary: 260 mg/dL — ABNORMAL HIGH (ref 70–99)
Glucose-Capillary: 262 mg/dL — ABNORMAL HIGH (ref 70–99)

## 2020-02-04 LAB — URINALYSIS, COMPLETE (UACMP) WITH MICROSCOPIC
Bilirubin Urine: NEGATIVE
Glucose, UA: NEGATIVE mg/dL
Ketones, ur: NEGATIVE mg/dL
Nitrite: NEGATIVE
Protein, ur: 100 mg/dL — AB
RBC / HPF: >50 RBC/hpf — ABNORMAL HIGH (ref 0–5)
Specific Gravity, Urine: 1.015 (ref 1.005–1.030)
WBC, UA: >50 WBC/hpf — ABNORMAL HIGH (ref 0–5)
pH: 5 (ref 5.0–8.0)

## 2020-02-04 LAB — MAGNESIUM: Magnesium: 2.4 mg/dL (ref 1.7–2.4)

## 2020-02-04 LAB — HEMOGLOBIN A1C
Hgb A1c MFr Bld: 5.6 % (ref 4.8–5.6)
Mean Plasma Glucose: 114.02 mg/dL

## 2020-02-04 LAB — PROTIME-INR
INR: 1.1 (ref 0.8–1.2)
Prothrombin Time: 13.6 seconds (ref 11.4–15.2)

## 2020-02-04 LAB — PROCALCITONIN: Procalcitonin: 5.38 ng/mL

## 2020-02-04 LAB — RESP PANEL BY RT-PCR (FLU A&B, COVID) ARPGX2
Influenza A by PCR: NEGATIVE
Influenza B by PCR: NEGATIVE
SARS Coronavirus 2 by RT PCR: NEGATIVE

## 2020-02-04 LAB — APTT: aPTT: 32 s (ref 24–36)

## 2020-02-04 MED ORDER — CHLORHEXIDINE GLUCONATE CLOTH 2 % EX PADS
6.0000 | MEDICATED_PAD | Freq: Every day | CUTANEOUS | Status: DC
  Administered 2020-02-04: 09:00:00 6 via TOPICAL

## 2020-02-04 MED ORDER — SODIUM CHLORIDE 0.9 % IV SOLN: 250.0000 mL | INTRAVENOUS | Status: DC

## 2020-02-04 MED ORDER — MAGNESIUM SULFATE 2 GM/50ML IV SOLN: 2.0000 g | Freq: Once | INTRAVENOUS | Status: DC

## 2020-02-04 MED ORDER — NOREPINEPHRINE 16 MG/250ML-% IV SOLN
0.0000 ug/min | INTRAVENOUS | Status: DC
  Administered 2020-02-04: 21:00:00 5 ug/min via INTRAVENOUS
  Administered 2020-02-04: 09:00:00 2 ug/min via INTRAVENOUS
  Administered 2020-02-05: 06:00:00 16 ug/min via INTRAVENOUS
  Filled 2020-02-04 (×4): qty 250

## 2020-02-04 MED ORDER — VASOPRESSIN 20 UNITS/100 ML INFUSION FOR SHOCK
0.0000 [IU]/min | INTRAVENOUS | Status: DC
  Administered 2020-02-04 – 2020-02-05 (×2): 0.03 [IU]/min via INTRAVENOUS
  Filled 2020-02-04 (×4): qty 100

## 2020-02-04 MED ORDER — LACTATED RINGERS IV BOLUS
1000.0000 mL | Freq: Once | INTRAVENOUS | Status: AC
  Administered 2020-02-04: 17:00:00 1000 mL via INTRAVENOUS

## 2020-02-04 MED ORDER — INSULIN REGULAR HUMAN 100 UNIT/ML IJ SOLN
10.0000 [IU] | Freq: Once | INTRAMUSCULAR | Status: DC
  Filled 2020-02-04: qty 3

## 2020-02-04 MED ORDER — LACTATED RINGERS IV BOLUS (SEPSIS)
1000.0000 mL | Freq: Once | INTRAVENOUS | Status: AC
  Administered 2020-02-04: 03:00:00 1000 mL via INTRAVENOUS

## 2020-02-04 MED ORDER — STERILE WATER FOR INJECTION IV SOLN
INTRAVENOUS | Status: DC
  Filled 2020-02-04 (×3): qty 850
  Filled 2020-02-04: qty 150
  Filled 2020-02-04: qty 850

## 2020-02-04 MED ORDER — NOREPINEPHRINE 4 MG/250ML-% IV SOLN
2.0000 ug/min | INTRAVENOUS | Status: DC
  Administered 2020-02-04: 05:00:00 2 ug/min via INTRAVENOUS
  Filled 2020-02-04: qty 250

## 2020-02-04 MED ORDER — SODIUM BICARBONATE 8.4 % IV SOLN: 50.0000 meq | Freq: Once | INTRAVENOUS | Status: DC

## 2020-02-04 MED ORDER — LACTATED RINGERS IV BOLUS
1000.0000 mL | Freq: Once | INTRAVENOUS | Status: AC
  Administered 2020-02-04: 12:00:00 1000 mL via INTRAVENOUS

## 2020-02-04 MED ORDER — FENTANYL CITRATE (PF) 100 MCG/2ML IJ SOLN
50.0000 ug | Freq: Once | INTRAMUSCULAR | Status: AC
Start: 2020-02-04 — End: 2020-02-04
  Administered 2020-02-04: 18:00:00 50 ug via INTRAVENOUS
  Filled 2020-02-04: qty 2

## 2020-02-04 MED ORDER — VANCOMYCIN HCL 1500 MG/300ML IV SOLN
1500.0000 mg | Freq: Once | INTRAVENOUS | Status: AC
  Administered 2020-02-04: 04:00:00 1500 mg via INTRAVENOUS
  Filled 2020-02-04: qty 300

## 2020-02-04 MED ORDER — GERHARDT'S BUTT CREAM
TOPICAL_CREAM | Freq: Two times a day (BID) | CUTANEOUS | Status: DC
  Filled 2020-02-04: qty 1

## 2020-02-04 MED ORDER — INSULIN ASPART 100 UNIT/ML ~~LOC~~ SOLN: 10.0000 [IU] | Freq: Once | SUBCUTANEOUS | Status: DC

## 2020-02-04 MED ORDER — SODIUM CHLORIDE 0.9 % IV SOLN
2.0000 g | Freq: Once | INTRAVENOUS | Status: AC
  Administered 2020-02-04: 03:00:00 2 g via INTRAVENOUS
  Filled 2020-02-04: qty 2

## 2020-02-04 MED ORDER — HEPARIN SODIUM (PORCINE) 1000 UNIT/ML DIALYSIS
1000.0000 [IU] | INTRAMUSCULAR | Status: DC | PRN
Start: 2020-02-04 — End: 2020-02-05
  Administered 2020-02-04: 19:00:00 3000 [IU] via INTRAVENOUS
  Filled 2020-02-04 (×2): qty 6

## 2020-02-04 MED ORDER — VANCOMYCIN HCL IN DEXTROSE 1-5 GM/200ML-% IV SOLN: 1000.0000 mg | Freq: Once | INTRAVENOUS | Status: DC

## 2020-02-04 MED ORDER — INSULIN ASPART 100 UNIT/ML ~~LOC~~ SOLN
0.0000 [IU] | SUBCUTANEOUS | Status: DC
  Administered 2020-02-04: 08:00:00 8 [IU] via SUBCUTANEOUS
  Administered 2020-02-04: 13:00:00 5 [IU] via SUBCUTANEOUS
  Administered 2020-02-04: 17:00:00 3 [IU] via SUBCUTANEOUS
  Administered 2020-02-05: 2 [IU] via SUBCUTANEOUS
  Filled 2020-02-04 (×5): qty 1

## 2020-02-04 MED ORDER — SODIUM CHLORIDE 0.9 % IV SOLN: 2.0000 g | INTRAVENOUS | Status: DC

## 2020-02-04 MED ORDER — METRONIDAZOLE IN NACL 5-0.79 MG/ML-% IV SOLN
500.0000 mg | Freq: Once | INTRAVENOUS | Status: AC
  Administered 2020-02-04: 03:00:00 500 mg via INTRAVENOUS
  Filled 2020-02-04: qty 100

## 2020-02-04 MED ORDER — PIPERACILLIN-TAZOBACTAM IN DEX 2-0.25 GM/50ML IV SOLN
2.2500 g | Freq: Three times a day (TID) | INTRAVENOUS | Status: DC
  Administered 2020-02-04 – 2020-02-05 (×3): 2.25 g via INTRAVENOUS
  Filled 2020-02-04 (×6): qty 50

## 2020-02-04 MED ORDER — PATIROMER SORBITEX CALCIUM 8.4 G PO PACK
16.8000 g | PACK | Freq: Every day | ORAL | Status: AC
  Filled 2020-02-04: qty 2

## 2020-02-04 MED ORDER — DEXTROSE 50 % IV SOLN: 1.0000 | Freq: Once | INTRAVENOUS | Status: DC

## 2020-02-04 MED ORDER — POLYETHYLENE GLYCOL 3350 17 G PO PACK
17.0000 g | PACK | Freq: Every day | ORAL | Status: DC | PRN
Start: 2020-02-04 — End: 2020-02-05

## 2020-02-04 MED ORDER — LACTATED RINGERS IV BOLUS
500.0000 mL | Freq: Once | INTRAVENOUS | Status: AC
  Administered 2020-02-04: 19:00:00 500 mL via INTRAVENOUS

## 2020-02-04 MED ORDER — SODIUM BICARBONATE 8.4 % IV SOLN
50.0000 meq | Freq: Once | INTRAVENOUS | Status: AC
  Administered 2020-02-04: 05:00:00 50 meq via INTRAVENOUS
  Filled 2020-02-04: qty 50

## 2020-02-04 MED ORDER — HEPARIN SODIUM (PORCINE) 5000 UNIT/ML IJ SOLN
5000.0000 [IU] | Freq: Three times a day (TID) | INTRAMUSCULAR | Status: DC
  Administered 2020-02-04 (×3): 5000 [IU] via SUBCUTANEOUS
  Filled 2020-02-04 (×3): qty 1

## 2020-02-04 MED ORDER — DOCUSATE SODIUM 100 MG PO CAPS: 100.0000 mg | ORAL_CAPSULE | Freq: Two times a day (BID) | ORAL | Status: DC | PRN

## 2020-02-04 MED ORDER — FENTANYL CITRATE (PF) 100 MCG/2ML IJ SOLN
25.0000 ug | Freq: Once | INTRAMUSCULAR | Status: AC
  Administered 2020-02-05: 25 ug via INTRAVENOUS
  Filled 2020-02-04: qty 2

## 2020-02-04 MED ORDER — PANTOPRAZOLE SODIUM 40 MG IV SOLR
40.0000 mg | INTRAVENOUS | Status: DC
  Administered 2020-02-04: 08:00:00 40 mg via INTRAVENOUS
  Filled 2020-02-04: qty 40

## 2020-02-04 MED ORDER — ACETAMINOPHEN 325 MG PO TABS: 650.0000 mg | ORAL_TABLET | ORAL | Status: DC | PRN

## 2020-02-04 MED ORDER — VANCOMYCIN HCL IN DEXTROSE 1-5 GM/200ML-% IV SOLN
1000.0000 mg | Freq: Once | INTRAVENOUS | Status: AC
  Administered 2020-02-04: 04:00:00 1000 mg via INTRAVENOUS
  Filled 2020-02-04: qty 200

## 2020-02-04 MED ORDER — ONDANSETRON HCL 4 MG/2ML IJ SOLN: 4.0000 mg | Freq: Four times a day (QID) | INTRAMUSCULAR | Status: DC | PRN

## 2020-02-04 MED ORDER — COLLAGENASE 250 UNIT/GM EX OINT
TOPICAL_OINTMENT | Freq: Every day | CUTANEOUS | Status: DC
  Filled 2020-02-04: qty 30

## 2020-02-04 NOTE — Procedures (Signed)
Central Venous Catheter Insertion Procedure Note  Keaunna Skipper  [id number]  1950-09-27  Date:01/17/2020  Time:6:40 AM   Provider Performing:Pricilla Moehle D Dewaine Conger   Procedure: Insertion of Non-tunneled Central Venous Catheter(36556) with US guidance (10258)   Indication(s) Medication administration and Difficult access  Consent Unable to obtain consent due to emergent nature of procedure.  Anesthesia Topical only with 1% lidocaine   Timeout Verified patient identification, verified procedure, site/side was marked, verified correct patient position, special equipment/implants available, medications/allergies/relevant history reviewed, required imaging and test results available.  Sterile Technique Maximal sterile technique including full sterile barrier drape, hand hygiene, sterile gown, sterile gloves, mask, hair covering, sterile ultrasound probe cover (if used).  Procedure Description Area of catheter insertion was cleaned with chlorhexidine and draped in sterile fashion.  With real-time ultrasound guidance a central venous catheter was placed into the left femoral vein. Nonpulsatile blood flow and easy flushing noted in all ports.  The catheter was sutured in place and sterile dressing applied.  Complications/Tolerance None; patient tolerated the procedure well. Chest X-ray is ordered to verify placement for internal jugular or subclavian cannulation.   Chest x-ray is not ordered for femoral cannulation.  EBL Minimal  Specimen(s) None  Line secured at the 20 cm mark.  BIOPATCH applied to the insertion site.    Darel Hong, AGACNP-BC [facility] Pager: [phone]

## 2020-02-04 NOTE — Progress Notes (Signed)
NAME:  Gabrielle [name], MRN:  [id number], DOB:  1950/03/04, LOS: 0 ADMISSION DATE:  01/17/2020, CONSULTATION DATE:  01/17/2020 REFERRING MD:  Dr. Karma Greaser, CHIEF COMPLAINT:  Altered Mental Status   Brief History:  69 y.o. Female admitted with Septic Shock in the setting of UTI, along with AKI  and severe anion gap metabolic acidosis.  History of Present Illness:  Gabrielle [name] is a 69 y.o. Female with a past medical history as listed below who presents to [facility] ED on 01/31/2020 from [facility] due to Altered Mental Status.  Pt remains altered at this time, therefore history is obtained from ED and nursing notes.  Per notes, her nursing facility reports she has not been exhibiting her normal behavior for about 3 to 4 days, which has worsened over time.  Lab work and Urinalysis were performed at the facility yesterday which reportedly has bicarb of 12 and grossly positive UA.  Reportedly no treatment was initiated.  Last night she had decreased level of consciousness prompting sending her to the ED for further evaluation.  ED Course: Upon presentation to the ED vital signs included temperature 95.3 rectally, RR 16, HR 121, BP 114/95, O2 saturations 100% on 2L nasal cannula. Workup revealed WBC 11.7 w/ neutrophilia and mild left shift, procalcitonin 5.38, lactic acid 9.2, potassium 5.5, bicarb 14, anion gap 20, BUN 64, Creatinine 4.48, alkaline phosphatase 176, and glucose 265. Venous blood gas with pH 7.11, pCO2 35, pO2 31, and Bicarb 11.1.  Chest x-ray without acute disease, and Urinalysis is consistent with UTI.  Her COVID-19 PCR and Influenza PCR are both negative.   She met sepsis criteria, therefore she received IV fluid resuscitation and broad spectrum antibiotics (Cefepime, Flagyl, and Vancomycin).  While in the ED she became hypotensive refractory to IV fluids, therefore she was placed on Levophed drip.  PCCM is asked to admit the patient to ICU for further workup and treatment of Septic  Shock in the setting of UTI, along with AKI and severe anion gap metabolic acidosis requiring Bicarb drip.  Nephrology has been consulted.   Of note, she was recently admitted to [facility] from 01/25/20 to 01/26/20 for bleeding from her chronic sacral decubitus ulcers.   Interval history 01/19/2020-Since admission patient having sustained hypotension and elevated lactic acid of 9.6.  Patient obtain CT abdomen.  No acute findings some possible colitis.  Culture still pending   Past Medical History:  OSA COPD on 2L Supplemental O2 Combined Systolic & Diastolic CHF Morbid Obesity Diabetes Mellitus Hypertension CKD Anemia Cellulitis of Left lower extremity Osteoarthritis  Significant Hospital Events:  12/28: Admit to ICU  Consults:  PCCM Nephrology  Procedures:  12/28: Left femoral CVC placed 12/28: Left femoral arterial line placed  Significant Diagnostic Tests:  12/28: CXR>>No active disease. 12/28: RUQ Abdominal US>>  Micro Data:  12/28: SARS-CoV-2 PCR>>negative 12/28: Influenza PCR>>negative 12/28: Blood culture x2>> 12/28: Urine >>  Antimicrobials:  Cefepime x1 dose 12/28 Flagyl x1 dose 12/28 Vancomycin x1 dose 12/28 Ceftriaxone 12/28>>  Interim History / Subjective:  Pt is awake but altered, currently protecting her airway Able to nod to yes/no questions Levophed gtt initiated due to hypotension On 2L [place] Does complain of abdominal pain upon palpitation, but no guarding or rebound tenderness, abdomen is soft  Objective   Blood pressure (!) 79/56, pulse (!) 117, temperature 98.3 F (36.8 C), temperature source Axillary, resp. rate (!) 25, height 5' 6"  (1.676 m), weight 120.3 kg, SpO2 100 %.  Intake/Output Summary (Last 24 hours) at 01/22/2020 1607 Last data filed at 01/14/2020 0600 Gross per 24 hour  Intake 2545.49 ml  Output --  Net 2545.49 ml   Filed Weights   02/03/2020 0230  Weight: 120.3 kg    Examination: General: Acute on chronically ill  appearing female, laying in bed, on 2L [place], in NAD HENT: Atraumatic, normocephalic, neck supple, no JVD Lungs: Clear to auscultation bilaterally, no rales or wheezing, even, nonlabored Cardiovascular: Tachycardia, regular rhythm, s1s2, no M/R/G Abdomen: Obese, soft, generalized tenderness, no guarding or rebound tenderness, BS+ x4 Extremities: No gross deformities, no edema Neuro: Awake and alert, oriented only to self, follows commands, no focal deficits GU: Grossly purulent urine.  Chronic pressure wounds around her anus and on sacrum which are bleeding Skin: Chronic sacral decubs and pressure wounds around the anus  CT abdomen from 01/11/2020 showing the following:  Lower chest: Lung bases are currently clear. There is some chronic pleural thickening on the left with somewhat nodular configuration. This could be ordinary pleural scarring, but pleural masses are not excluded.  Hepatobiliary: Mild fatty change of the liver. Numerous gallstones dependent in the gallbladder. Gallbladder sludge. No CT evidence of cholecystitis or obstruction.  Pancreas: Normal  Spleen: Normal  Adrenals/Urinary Tract: Adrenal glands are normal. Kidneys are normal. Bladder is normal.  Stomach/Bowel: Stomach and small intestine are normal. Moderate amount of fecal matter in the colon. There could be a degree of fecal impaction. Question if there could be mild colitis of the distal colon related to impaction.  Vascular/Lymphatic: Aortic atherosclerosis. No aneurysm. IVC is normal. No retroperitoneal adenopathy.  Reproductive: Normal.  No pelvic mass.  Other: Small amount of free fluid without loculation/abscess. No free air. No hernia.  Musculoskeletal: Ordinary spinal degenerative changes. Chronic pars defects at L5.  IMPRESSION: 1. Moderate amount of fecal matter in the colon. There could be a degree of fecal impaction. Question mild colitis of the distal colon, possibly secondary  to impaction. 2. Small amount of free fluid in the pelvis without loculation/abscess. 3. Chronic pleural thickening on the left with somewhat nodular configuration. This could be ordinary pleural scarring, but pleural masses are not excluded. Suspicion is low but consider follow-up the patient has a history of malignancy. 4. Aortic atherosclerosis. 5. Mild fatty change of the liver. Multiple gallstones without CT evidence of cholecystitis or obstruction. 6. Chronic pars defects at L5.      Resolved Hospital Problem list   N/A  Assessment & Plan:   Septic Shock Chronic combined Systolic & Diastolic CHF without acute exacerbation Hx: HTN -Continuous cardiac monitoring -Maintain MAP >65 -Received IV Fluid resuscitation in ED -POCUS showing patient would be volume responsive and thus could receive more -continue vaso and levo -Trend lactic acid- still not improving and CT abdomen reassuring.  If lactic still not responding to fluid challenge will send down for CTA of chest abdomen pelvis and will also get CT head -Check troponin -Obtain 2D Echocardiogram   Severe Sepsis in the setting of UTI -Monitor fever curve -Trend WBC's & Procalcitonin -Follow cultures as above -Start broad spectrum to include vanc and zosyn   AKI on CKD Anion Gap Metabolic Acidosis in the setting of lactic acidosis Mild Hyperkalemia -Monitor I&O's / urinary output -Follow BMP -Ensure adequate renal perfusion -Avoid nephrotoxic agents as able -Replace electrolytes as indicated -Bicarb gtt -Given 10 units insulin, 1 amp bicarb, and [place] for Hyperkalemia -Consult Nephrology, appreciate input -Repeat BMP without any signs of emergent need  for dialysis however patient remains oliguric on the cusp of anuric -We will place Vas-Cath   Elevated Alkaline Phosphatase Abdominal pain -NPO for now -Trend LFT's -Obtain RUQ abdominal US -Consider CT Abdomen/Pelvis if worsening lactic  acidosis   Chronic Hypoxic Respiratory Failure (on 2L supplemental O2 at baseline) COPD without acute exacerbation -Supplemental O2 as needed to maintain O2 sats 88 to 94% -Currently maintaining her airway, but high risk for intubation due to sepsis and severe metabolic derangements  -Follow intermittent CXR & ABG as needed -Prn Bronchodilators   Anemia of Chronic Disease -Monitor for S/Sx of bleeding -Trend CBC -Heparin SQ for VTE Prophylaxis  -Transfuse for Hgb <7   Diabetes Mellitus -CBG's -Sliding scale insulin -Follow ICU Hypo/hyperglycemia protocol -Check Hgb A1c   Chronic Sacral Decubitus Ulcerations and Chronic wounds around anus -Consult Wound care, appreciate input        Pt is critically ill, prognosis is guarded.  She is at high risk for cardiac arrest and death.     Best practice (evaluated daily)  Diet: NPO for now until mentation improved Pain/Anxiety/Delirium protocol (if indicated): N/A VAP protocol (if indicated): N/A DVT prophylaxis: Heparin SQ GI prophylaxis: Protonix Glucose control: SSI Mobility: As tolerated Disposition:ICU  Goals of Care:  Last date of multidisciplinary goals of care discussion: N/A Family and staff present: Updated Pt's sister Bethena Roys at bedside, pt's son is commuting from [place] Summary of discussion: Septic shock due to UTI requiring Vasopressors.  AKI that may need dialysis in the near future. Follow up goals of care discussion due: 2020/02/14 Code Status: Full code  Labs   CBC: Recent Labs  Lab 01/12/2020 0252  WBC 11.7*  NEUTROABS 9.4*  HGB 9.6*  HCT 32.5*  MCV 105.9*  PLT 161    Basic Metabolic Panel: Recent Labs  Lab 01/26/2020 0252 01/16/2020 1201  NA 138 137  K 5.5* 4.5  CL 104 102  CO2 14* 14*  GLUCOSE 265* 232*  BUN 64* 60*  CREATININE 4.48* 4.44*  CALCIUM 9.3 8.5*  MG 2.4  --   PHOS  --  4.4   GFR: Estimated Creatinine Clearance: 16.2 mL/min (A) (by C-G formula based on SCr of 4.44 mg/dL  (H)). Recent Labs  Lab 01/13/2020 0252 01/18/2020 0410 02/03/2020 0642 02/06/2020 1201  PROCALCITON 5.38  --   --   --   WBC 11.7*  --   --   --   LATICACIDVEN 9.2* 10.7* 10.5* 9.6*    Liver Function Tests: Recent Labs  Lab 02/07/2020 0252 01/25/2020 1201  AST 41  --   ALT 17  --   ALKPHOS 176*  --   BILITOT 0.8  --   PROT 6.9  --   ALBUMIN 1.9* 1.6*   No results for input(s): LIPASE, AMYLASE in the last 168 hours. No results for input(s): AMMONIA in the last 168 hours.  ABG    Component Value Date/Time   PHART 7.45 05/03/2016 1110   PCO2ART 70 (HH) 05/03/2016 1110   PO2ART 69 (L) 05/03/2016 1110   HCO3 11.1 (L) 01/24/2020 0252   ACIDBASEDEF 17.3 (H) 02/03/2020 0252   O2SAT 34.7 01/11/2020 0252     Coagulation Profile: Recent Labs  Lab 01/19/2020 0252  INR 1.1    Cardiac Enzymes: No results for input(s): CKTOTAL, CKMB, CKMBINDEX, TROPONINI in the last 168 hours.  HbA1C: Hgb A1c MFr Bld  Date/Time Value Ref Range Status  01/14/2020 02:52 AM 5.6 4.8 - 5.6 % Final    Comment:    (  NOTE) Pre diabetes:          5.7%-6.4%  Diabetes:              >6.4%  Glycemic control for   <7.0% adults with diabetes   01/17/2020 04:32 AM 5.7 (H) 4.8 - 5.6 % Final    Comment:    (NOTE) Pre diabetes:          5.7%-6.4%  Diabetes:              >6.4%  Glycemic control for   <7.0% adults with diabetes     CBG: Recent Labs  Lab 01/11/2020 0456 01/10/2020 0723 01/21/2020 1109  GLUCAP 249* 262* 220*    Review of Systems:   Unable to assess due to AMS and critical illness  Past Medical History:  She,  has a past medical history of Allergy, Anemia, Arthritis, Cellulitis and abscess of left leg, Chronic kidney disease, Chronic venous stasis dermatitis of both lower extremities, Diabetes mellitus without complication ([place]), Hypertension, Morbid obesity ([place]), Osteoarthritis, Oxygen dependent, Skin cancer, and Sleep apnea.   Surgical History:   Past Surgical History:  Procedure  Laterality Date  . NO PAST SURGERIES    . none       Social History:   reports that she has never smoked. She has never used smokeless tobacco. She reports that she does not drink alcohol and does not use drugs.   Family History:  Her family history includes CAD in her father and mother; Diabetes Mellitus II in her father; Hiatal hernia in her mother.   Allergies Allergies  Allergen Reactions  . Lasix [Furosemide]     Numbness and tingling of the face   . Ultram [Tramadol] Other (See Comments)    Pt reports, "makes my head feel funny."  . Erythromycin Rash  . Plasticized Base [Plastibase] Rash  . Tetracyclines & Related Rash     Home Medications  Prior to Admission medications   Medication Sig Start Date End Date Taking? Authorizing Provider  acetaminophen (TYLENOL) 500 MG tablet Take 500 mg by mouth 3 (three) times daily.    [provider]  benztropine (COGENTIN) 1 MG tablet Take 1 mg by mouth 2 (two) times daily.  04/26/17   [provider]  bisacodyl (DULCOLAX) 5 MG EC tablet Take 10 mg by mouth daily as needed for moderate constipation or severe constipation.    [provider]  busPIRone (BUSPAR) 10 MG tablet Take 10 mg by mouth 3 (three) times daily.     [provider]  clobetasol cream (TEMOVATE) 9.67 % Apply 1 application topically in the morning and at bedtime. (apply to scalp, legs and arms)    [provider]  clopidogrel (PLAVIX) 75 MG tablet Take 1 tablet (75 mg total) by mouth daily. 08/15/19 08/14/20  Max Sane, MD  docusate sodium (COLACE) 100 MG capsule Take 100 mg by mouth daily.     [provider]  empagliflozin (JARDIANCE) 25 MG TABS tablet Take 1 tablet (25 mg total) by mouth daily before breakfast. 08/15/19   Max Sane, MD  ethacrynic acid (EDECRIN) 25 MG tablet Take 3 tablets (75 mg total) by mouth 2 (two) times daily. 05/04/16   Vaughan Basta, MD  Exenatide ER (BYDUREON BCISE) 2 MG/0.85ML AUIJ  Inject 2 mg into the skin every Wednesday.    [provider]  fluticasone (FLONASE) 50 MCG/ACT nasal spray Place 1 spray into both nostrils every 12 (twelve) hours as needed for allergies.  [provider]  HUMALOG 100 UNIT/ML cartridge Inject 5 Units into the skin See admin instructions. Inject 5u under the skin three times daily with meals with sliding scale    [provider]  HYDROcodone-acetaminophen (NORCO/VICODIN) 5-325 MG tablet Take 1 tablet by mouth every 6 (six) hours as needed for up to 10 doses for moderate pain or severe pain. 01/26/20   Fritzi Mandes, MD  Infant Care Products [facility]) OINT Apply 1 application topically daily.    [provider]  insulin glargine (LANTUS) 100 UNIT/ML injection Inject 0.4 mLs (40 Units total) into the skin daily. 01/26/20   Fritzi Mandes, MD  ipratropium-albuterol (DUONEB) 0.5-2.5 (3) MG/3ML SOLN Take 3 mLs by nebulization every 4 (four) hours as needed (congestion).    [provider]  nabumetone (RELAFEN) 500 MG tablet Take 500 mg by mouth daily.     [provider]  nitroGLYCERIN (NITROSTAT) 0.4 MG SL tablet Place 0.4 mg under the tongue every 5 (five) minutes as needed for chest pain.    [provider]  nystatin cream (MYCOSTATIN) Apply 1 application topically 2 (two) times daily. (apply to perennial area)    [provider]  omeprazole (PRILOSEC) 20 MG capsule Take 20 mg by mouth every other day.    [provider]  polyethylene glycol (MIRALAX / GLYCOLAX) packet Take 17 g by mouth daily.    [provider]  sodium chloride (OCEAN) 0.65 % SOLN nasal spray Place 2 sprays into both nostrils every 8 (eight) hours as needed for congestion.    [provider]  vitamin B-12 (CYANOCOBALAMIN) 1000 MCG tablet Take 1,000 mcg by mouth every Monday, Wednesday, and Friday.    [provider]     Critical care time: 35 minutes     Newell Coral  DO Internal Medicine/Pediatrics Pulmonary and Critical Care Fellow PGY-7

## 2020-02-04 NOTE — Progress Notes (Signed)
PHARMACY -  BRIEF ANTIBIOTIC NOTE   Pharmacy has received consult(s) for Vancomycin and Cefepime from an ED provider for Code Sepis/Unknown source.  The patient's profile has been reviewed for ht/wt/allergies/indication/available labs.    One time order(s) placed for Cefepime and Vancomcyin  Further antibiotics/pharmacy consults should be ordered by admitting physician if indicated.                       Renda Rolls, PharmD, [facility] 01/26/2020 2:55 AM

## 2020-02-04 NOTE — ED Provider Notes (Signed)
[facility] Emergency Department Provider Note  ____________________________________________   Event Date/Time   First MD Initiated Contact with Patient 01/23/2020 [phone]     (approximate)  I have reviewed the triage vital signs and the nursing notes.   HISTORY  Chief Complaint Altered Mental Status  Level 5 caveat:  history/ROS limited by acute/critical illness  HPI Gabrielle [name] is a 69 y.o. female with extensive medical history as listed below who was discharged from the hospital couple weeks ago after a possible GI bleed and who is cared for at [facility].  She presents tonight by EMS as a sepsis alert.  According to [place], she has not been exhibiting her normal behavior for about 3 to 4 days.  She seems to gradually be getting worse over time.  They performed some lab work and a urinalysis yesterday which was abnormal reportedly with a bicarb of 12 and grossly positive urinalysis.  Reportedly no treatment was initiated.  She was seen by the nurse practitioner at the facility who agreed that her behavior was off and that she was acting confused.  By tonight she has such a decreased level of responsiveness that they sent her to the emergency department.  She is hypertensive and tachycardic upon arrival and able to answer simple yes or no questions but is otherwise altered.         Past Medical History:  Diagnosis Date  . Allergy    seasonal allergies  . Anemia    anemia of chronic kidney disease  . Arthritis   . Cellulitis and abscess of left leg   . Chronic kidney disease    she reports grade 2 kidney disease  . Chronic venous stasis dermatitis of both lower extremities   . Diabetes mellitus without complication ([place])   . Hypertension   . Morbid obesity ([place])   . Osteoarthritis   . Oxygen dependent   . Skin cancer   . Sleep apnea     Patient Active Problem List   Diagnosis Date Noted  . Sepsis ([place]) 01/10/2020  . Chronic  kidney disease   . BRBPR (bright red blood per rectum) 01/25/2020  . Chronic systolic CHF (congestive heart failure) ([place]) 01/25/2020  . Hyponatremia 01/17/2020  . Anemia of chronic kidney failure, stage 4 (severe) ([place]) 01/17/2020  . Diarrhea 01/17/2020  . Sacral decubitus ulcer 01/17/2020  . Iron deficiency anemia 10/21/2019  . COPD with chronic bronchitis ([place]) 08/14/2019  . OSA (obstructive sleep apnea) 08/14/2019  . Obesity, Class III, BMI 40-49.9 (morbid obesity) ([place]) 08/14/2019  . UTI (urinary tract infection) 08/14/2019  . Aphasia 08/13/2019  . 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 Cellulitis 04/29/2016  . Acute kidney injury superimposed on CKD ([place]) 10/02/2015  . Diabetes ([place]) 10/01/2015  . SOB (shortness of breath) 10/01/2015  . AKI (acute kidney injury) ([place]) 10/01/2015  . Hyperkalemia 07/21/2015  . Uncontrolled hypertension 07/21/2015    Past Surgical History:  Procedure Laterality Date  . NO PAST SURGERIES    . none      Prior to Admission medications   Medication Sig Start Date End Date Taking? Authorizing Provider  acetaminophen (TYLENOL) 500 MG tablet Take 500 mg by mouth 3 (three) times daily.   Yes [provider]  benztropine (COGENTIN) 1 MG tablet Take 1 mg by mouth 2 (two) times daily.  04/26/17  Yes [provider]  bisacodyl (DULCOLAX) 5 MG EC tablet Take 10 mg by mouth daily as needed  for moderate constipation or severe constipation.   Yes [provider]  busPIRone (BUSPAR) 10 MG tablet Take 10 mg by mouth 3 (three) times daily.    Yes [provider]  clobetasol cream (TEMOVATE) 0.08 % Apply 1 application topically in the morning and at bedtime. (apply to scalp, legs and arms)   Yes [provider]  clopidogrel (PLAVIX) 75 MG tablet Take 1 tablet (75 mg total) by mouth daily. 08/15/19 08/14/20 Yes Max Sane, MD  empagliflozin (JARDIANCE) 25 MG TABS tablet Take 1 tablet (25 mg total) by mouth daily  before breakfast. 08/15/19  Yes Max Sane, MD  ethacrynic acid (EDECRIN) 25 MG tablet Take 3 tablets (75 mg total) by mouth 2 (two) times daily. 05/04/16  Yes Vaughan Basta, MD  fluticasone (FLONASE) 50 MCG/ACT nasal spray Place 1 spray into both nostrils every 12 (twelve) hours as needed for allergies.    Yes [provider]  HUMALOG 100 UNIT/ML cartridge Inject 5 Units into the skin See admin instructions. Inject 5u under the skin three times daily with meals with sliding scale   Yes [provider]  HYDROcodone-acetaminophen (NORCO/VICODIN) 5-325 MG tablet Take 1 tablet by mouth every 6 (six) hours as needed for up to 10 doses for moderate pain or severe pain. 01/26/20  Yes Fritzi Mandes, MD  insulin glargine (LANTUS) 100 UNIT/ML injection Inject 0.4 mLs (40 Units total) into the skin daily. 01/26/20  Yes Fritzi Mandes, MD  nabumetone (RELAFEN) 500 MG tablet Take 500 mg by mouth daily.    Yes [provider]  nitroGLYCERIN (NITROSTAT) 0.4 MG SL tablet Place 0.4 mg under the tongue every 5 (five) minutes as needed for chest pain.   Yes [provider]  nystatin cream (MYCOSTATIN) Apply 1 application topically 2 (two) times daily. (apply to perennial area)   Yes [provider]  polyethylene glycol (MIRALAX / GLYCOLAX) packet Take 17 g by mouth daily.   Yes [provider]  promethazine (PHENERGAN) 25 MG tablet Take 25 mg by mouth every 6 (six) hours as needed. 02/03/20 02/06/20 Yes [provider]  senna (SENOKOT) 8.6 MG TABS tablet Take 2 tablets by mouth daily.   Yes [provider]  sodium chloride (OCEAN) 0.65 % SOLN nasal spray Place 2 sprays into both nostrils every 8 (eight) hours as needed for congestion.   Yes [provider]  vitamin B-12 (CYANOCOBALAMIN) 1000 MCG tablet Take 1,000 mcg by mouth every Monday, Wednesday, and Friday.   Yes [provider]  Exenatide ER (BYDUREON BCISE) 2 MG/0.85ML  AUIJ Inject 2 mg into the skin every Wednesday.    [provider]  Infant Care Products [facility]) OINT Apply 1 application topically daily.    [provider]  ipratropium-albuterol (DUONEB) 0.5-2.5 (3) MG/3ML SOLN Take 3 mLs by nebulization every 4 (four) hours as needed (congestion).    [provider]  omeprazole (PRILOSEC) 20 MG capsule Take 20 mg by mouth every other day. Patient not taking: Reported on 02/07/2020    [provider]    Allergies Lasix [furosemide], Ultram [tramadol], Erythromycin, Plasticized base [plastibase], and Tetracyclines & related  Family History  Problem Relation Age of Onset  . Diabetes Mellitus II Father   . CAD Father   . CAD Mother   . Hiatal hernia Mother     Social History Social History   Tobacco Use  . Smoking status: Never Smoker  . Smokeless tobacco: Never Used  Vaping Use  .  Vaping Use: Never used  Substance Use Topics  . Alcohol use: No  . Drug use: No    Review of Systems Level 5 caveat:  history/ROS limited by acute/critical illness   ____________________________________________   PHYSICAL EXAM:  VITAL SIGNS: ED Triage Vitals  Enc Vitals Group     BP 02/02/2020 0245 (!) 114/95     Pulse Rate 01/15/2020 0245 (!) 121     Resp 02/03/2020 0245 16     Temp 01/08/2020 0245 (!) 95.3 F (35.2 C)     Temp Source 02/03/2020 0245 Rectal     SpO2 01/12/2020 0245 100 %     Weight 01/21/2020 0230 120.3 kg (265 lb 3.2 oz)     Height 01/20/2020 0230 1.676 m (5\' 6" )     Head Circumference --      Peak Flow --      Pain Score 01/23/2020 0229 0     Pain Loc --      Pain Edu? --      Excl. in [place]? --     Constitutional: Awake but clearly altered, able to answer simple questions.  Ill-appearing. Eyes: Conjunctivae are normal.  Head: Atraumatic. Nose: No congestion/rhinnorhea. Mouth/Throat: Patient is wearing a mask. Neck: No stridor.  No meningeal signs.   Cardiovascular: Tachycardia, regular rhythm. Good  peripheral circulation. Respiratory: Tachypnea but normal respiratory effort.  Wears 2 L of oxygen at baseline. Gastrointestinal: Morbid obesity.  Soft with generalized tenderness to palpation throughout the abdomen but without rebound or guarding and no specific localized peritonitis. Genitourinary: Grossly purulent urine is leaking from her urethra.  Additionally she has chronic pressure wounds around her anus and on her sacrum which are bleeding. Musculoskeletal: No lower extremity tenderness nor edema. No gross deformities of extremities. Neurologic: Unable to participate in neurological exam but she is protecting her airway and able to answer simple yes or no questions. Skin: Known bleeding pressure wounds are present on her sacrum and back as well as around the anus.   ____________________________________________   LABS (all labs ordered are listed, but only abnormal results are displayed)  Labs Reviewed  LACTIC ACID, PLASMA - Abnormal; Notable for the following components:      Result Value   Lactic Acid, Venous 10.7 (*)    All other components within normal limits  LACTIC ACID, PLASMA - Abnormal; Notable for the following components:   Lactic Acid, Venous 9.2 (*)    All other components within normal limits  COMPREHENSIVE METABOLIC PANEL - Abnormal; Notable for the following components:   Potassium 5.5 (*)    CO2 14 (*)    Glucose, Bld 265 (*)    BUN 64 (*)    Creatinine, Ser 4.48 (*)    Albumin 1.9 (*)    Alkaline Phosphatase 176 (*)    GFR, Estimated 10 (*)    Anion gap 20 (*)    All other components within normal limits  CBC WITH DIFFERENTIAL/PLATELET - Abnormal; Notable for the following components:   WBC 11.7 (*)    RBC 3.07 (*)    Hemoglobin 9.6 (*)    HCT 32.5 (*)    MCV 105.9 (*)    MCHC 29.5 (*)    RDW 19.5 (*)    nRBC 0.9 (*)    Neutro Abs 9.4 (*)    Abs Immature Granulocytes 0.20 (*)    All other components within normal limits  URINALYSIS, COMPLETE  (UACMP) WITH MICROSCOPIC - Abnormal; Notable for the following components:  Color, Urine YELLOW (*)    APPearance TURBID (*)    Hgb urine dipstick LARGE (*)    Protein, ur 100 (*)    Leukocytes,Ua MODERATE (*)    RBC / HPF >50 (*)    WBC, UA >50 (*)    Bacteria, UA MANY (*)    All other components within normal limits  BLOOD GAS, VENOUS - Abnormal; Notable for the following components:   pH, Ven 7.11 (*)    pCO2, Ven 35 (*)    pO2, Ven <31.0 (*)    Bicarbonate 11.1 (*)    Acid-base deficit 17.3 (*)    All other components within normal limits  RESP PANEL BY RT-PCR (FLU A&B, COVID) ARPGX2  CULTURE, BLOOD (ROUTINE X 2)  CULTURE, BLOOD (ROUTINE X 2)  URINE CULTURE  PROCALCITONIN  PROTIME-INR  APTT  MAGNESIUM  HEMOGLOBIN A1C  RENAL FUNCTION PANEL   ____________________________________________  EKG  ED ECG REPORT #1 I, Hinda Kehr, the attending physician, personally viewed and interpreted this ECG.  Date: 01/27/2020 EKG Time: 2:43 AM Rate: 122 Rhythm: Sinus tachycardia versus junctional tachycardia QRS Axis: normal Intervals: slightly prolonged QTc  ST/T Wave abnormalities: Non-specific ST segment / T-wave changes, but no clear evidence of acute ischemia. Narrative Interpretation: no definitive evidence of acute ischemia; does not meet STEMI criteria.  ED ECG REPORT #2 I, Hinda Kehr, the attending physician, personally viewed and interpreted this ECG.  Date: 01/25/2020 EKG Time: 2:43 Rate: 122 Rhythm: wide complex tachycardia QRS Axis: borderline right axis deviation Intervals: prolnoged QTc at 631 ms ST/T Wave abnormalities: Non-specific ST segment / T-wave changes, but no clear evidence of acute ischemia. Narrative Interpretation: no definitive evidence of acute ischemia; does not meet STEMI criteria.     ____________________________________________  RADIOLOGY I, Hinda Kehr, personally viewed and evaluated these images (plain radiographs) as part of  my medical decision making, as well as reviewing the written report by the radiologist.  ED MD interpretation:  No acute abnormalities identified on CXR  Official radiology report(s): DG Chest Port 1 View  Result Date: 01/29/2020 CLINICAL DATA:  Encephalopathy EXAM: PORTABLE CHEST 1 VIEW COMPARISON:  01/25/2020 FINDINGS: The heart size and mediastinal contours are within normal limits. Both lungs are clear. The visualized skeletal structures are unremarkable. IMPRESSION: No active disease. Electronically Signed   By: Ulyses Jarred M.D.   On: 01/09/2020 03:50    ____________________________________________   PROCEDURES   Procedure(s) performed (including Critical Care):  .Critical Care Performed by: Hinda Kehr, MD Authorized by: Hinda Kehr, MD   Critical care provider statement:    Critical care time (minutes):  75   Critical care time was exclusive of:  Separately billable procedures and treating other patients   Critical care was necessary to treat or prevent imminent or life-threatening deterioration of the following conditions:  Sepsis   Critical care was time spent personally by me on the following activities:  Development of treatment plan with patient or surrogate, discussions with consultants, evaluation of patient's response to treatment, examination of patient, obtaining history from patient or surrogate, ordering and performing treatments and interventions, ordering and review of laboratory studies, ordering and review of radiographic studies, pulse oximetry, re-evaluation of patient's condition and review of old charts .1-3 Lead EKG Interpretation Performed by: Hinda Kehr, MD Authorized by: Hinda Kehr, MD     Interpretation: abnormal     ECG rate:  120   ECG rate assessment: tachycardic     Rhythm: sinus tachycardia  Ectopy: none     Conduction: normal       ____________________________________________   INITIAL IMPRESSION / MDM / ASSESSMENT AND  PLAN / ED COURSE  As part of my medical decision making, I reviewed the following data within the [place] History obtained from family, Nursing notes reviewed and incorporated, Labs reviewed , EKG interpreted , Old chart reviewed, Radiograph reviewed , Discussed with admitting physician  and Notes from prior ED visits   Differential diagnosis includes, but is not limited to, sepsis or septic shock, UTI, intra-abdominal infection, bacteremia, pneumonia.  Acute kidney injury is also possible as the facility reports she has not been eating or drinking for the last 2 days.  The patient is on the cardiac monitor to evaluate for evidence of arrhythmia and/or significant heart rate changes.  Patient meets sepsis criteria upon arrival.  Rectal temperature is 95.3, she is tachycardic, and was initially hypotensive although in Trendelenburg her blood pressure is up to 114/95.  I ordered 2 L of lactated Ringer's which meets criteria for 30 mils per kilo of ideal body weight.  Bair hugger ordered for hypothermia.  Full set of sepsis lab work is pending.  Initiating empiric antibiotics of cefepime 2 g IV, vancomycin per pharmacy consult, and metronidazole 500 mg IV, although I strongly suspect urinary source given the appearance of her urine which was obtained by in and out catheterization and is shown in a photo below:         Clinical Course as of 01/29/2020 0450  Tue Feb 04, 2020  0314 pH, Ven(!!): 7.11 [CF]  0329 Comprehensive metabolic panel(!) Substantial metabolic abnormalities, including acute on chronic renal failure, low CO2, potassium 5.5, anion gap 20.  Lactic acid still pending.  Although she has hyperkalemia, I will hold off on specifically treating it because we will focus on sepsis treatment and fluid resuscitation which will likely improve the hyperkalemia.  No concerning EKG abnormalities as a result of the hyperkalemia. [CF]  [phone] I discussed the case by phone with the  patient's son who is also her healthcare power of attorney.  I informed him of what we knew, sepsis probably from urinary tract infection, and that she has a guarded prognosis at best and that she is critically ill.  He confirmed that he wants her to be full code and that she has no paperwork in place currently to indicate that she is DNR, although he did say that in the past she has expressed a desire to not be intubated, but he is not certain that she has the capacity given her psychiatric history and that as the son and healthcare proxy attorney he wants her to remain full code, at least until he can get their and until we can see how she does.  He agreed with aggressive treatment. [CF]  [phone] Discussed case by phone with Darel Hong in the ICU.  Given the patient's critical illness I think she would be best served on the intensivist service.  We discussed the case and he understands and agrees and will admit the patient to the unit. [CF]  8099 DG Chest [facility] I personally reviewed the patient's imaging and agree with the radiologist's interpretation that there is no obvious acute abnormality on chest x-ray. [CF]  0355 Urinalysis, Complete w Microscopic(!) Grossly positive urinalysis [CF]  0355 Procalcitonin: 5.38 Substantially elevated procalcitonin [CF]  0407 SARS Coronavirus 2 by RT PCR: NEGATIVE [CF]  0408 Performed sepsis reassessment.  Patient  remains altered but normotensive, mildly tachycardic.  Still protecting airway, but unable to provide any substantial history.  Proceeding with ICU admission.  Asked ED RN to obtain second lactic acid to evaluate for improvement. [CF]  3532 Blood pressure now dropping substantially with a systolic pressure around 60.  Starting peripheral Levophed, informed ICU nurse practitioner.  They are going to get the patient to the ICU at the next available opportunity. [CF]  9924 Rhythm changed to a wider complex tachycardia and a QTC prolongation of 631 ms.   Ordered sodium bicarb ampule push.  Informed ICU NP. [CF]  0441 Lactic Acid, Venous(!!): 10.7 worsening lactic acid [CF]  0449 Blood pressure and heart rhythm have improved after ampule of bicarb.  Discussed case in person with Darel Hong.  Patient going up now to the ICU.  Blood pressure now about 268 systolic. [CF]    Clinical Course User Index [CF] Hinda Kehr, MD     ____________________________________________  FINAL CLINICAL IMPRESSION(S) / ED DIAGNOSES  Final diagnoses:  Septic shock ([place])  Urinary tract infection with hematuria, site unspecified  Delirium  Acute renal failure superimposed on chronic kidney disease, unspecified CKD stage, unspecified acute renal failure type ([place])  Hyperkalemia  Metabolic acidosis     MEDICATIONS GIVEN DURING THIS VISIT:  Medications  vancomycin (VANCOREADY) IVPB 1500 mg/300 mL (1,500 mg Intravenous New Bag/Given 01/26/2020 0407)    Followed by  vancomycin (VANCOCIN) IVPB 1000 mg/200 mL premix (1,000 mg Intravenous New Bag/Given 02/03/2020 0428)  sodium bicarbonate 150 mEq in sterile water 1,000 mL infusion ( Intravenous New Bag/Given 01/24/2020 0421)  docusate sodium (COLACE) capsule 100 mg (has no administration in time range)  polyethylene glycol (MIRALAX / GLYCOLAX) packet 17 g (has no administration in time range)  heparin injection 5,000 Units (has no administration in time range)  ondansetron (ZOFRAN) injection 4 mg (has no administration in time range)  acetaminophen (TYLENOL) tablet 650 mg (has no administration in time range)  insulin aspart (novoLOG) injection 0-15 Units (has no administration in time range)  cefTRIAXone (ROCEPHIN) 2 g in sodium chloride 0.9 % 100 mL IVPB (has no administration in time range)  dextrose 50 % solution 50 mL (has no administration in time range)  patiromer Daryll Drown) packet 16.8 g (has no administration in time range)  0.9 %  sodium chloride infusion (has no administration in time range)   norepinephrine (LEVOPHED) 4mg  in 274mL premix infusion (2 mcg/min Intravenous New Bag/Given 01/16/2020 0434)  0.9 %  sodium chloride infusion (has no administration in time range)  insulin aspart (novoLOG) injection 10 Units (has no administration in time range)  sodium bicarbonate injection 50 mEq (has no administration in time range)  magnesium sulfate IVPB 2 g 50 mL (has no administration in time range)  lactated ringers bolus 1,000 mL (0 mLs Intravenous Stopped 01/23/2020 0407)  ceFEPIme (MAXIPIME) 2 g in sodium chloride 0.9 % 100 mL IVPB (0 g Intravenous Stopped 01/21/2020 0327)  metroNIDAZOLE (FLAGYL) IVPB 500 mg (0 mg Intravenous Stopped 01/09/2020 0358)  lactated ringers bolus 1,000 mL (0 mLs Intravenous Stopped 01/08/2020 0418)  sodium bicarbonate injection 50 mEq (50 mEq Intravenous Given 01/26/2020 0435)     ED Discharge Orders    None      *Please note:  Dalyce Renne was evaluated in Emergency Department on 01/10/2020 for the symptoms described in the history of present illness. She was evaluated in the context of the global COVID-19 pandemic, which necessitated consideration that the patient might be at  risk for infection with the SARS-CoV-2 virus that causes COVID-19. Institutional protocols and algorithms that pertain to the evaluation of patients at risk for COVID-19 are in a state of rapid change based on information released by regulatory bodies including the CDC and federal and state organizations. These policies and algorithms were followed during the patient's care in the ED.  Some ED evaluations and interventions may be delayed as a result of limited staffing during and after the pandemic.*  Note:  This document was prepared using Dragon voice recognition software and may include unintentional dictation errors.   Hinda Kehr, MD 01/21/2020 [phone]

## 2020-02-04 NOTE — Progress Notes (Signed)
PHARMACY - PHYSICIAN COMMUNICATION CRITICAL VALUE ALERT - BLOOD CULTURE IDENTIFICATION (BCID)  Gabrielle [name] is an 69 y.o. female who presented to [facility] on 01/22/2020 with a chief complaint of Septic Shock ISO UTI.  Assessment:   BCID lab has 3 bottles with the following results: (1) Aerobic GPCs> enterococcus faecalis (2) Aerobic GNRs> enterbacteriales (non-speciated yet/was re-run and confirmed same result) (3) Anaerobic GPCs> enterococcus faecalis  Currently on Zosyn, covers all current and suspected organisms. Once speciation is finalized we can look to narrow coverage. No changes recommended at this time.  Name of physician (or Provider) Contacted: Dr. Orpah Melter  Current antibiotics:  Zosyn 2.25g q8h  Changes to prescribed antibiotics recommended:  Patient is on recommended antibiotics - No changes needed    Results for orders placed or performed during the hospital encounter of 01/20/2020  Blood Culture ID Panel (Reflexed) (Collected: 02/03/2020  2:52 AM)  Result Value Ref Range   Enterococcus faecalis NOT DETECTED NOT DETECTED   Enterococcus Faecium NOT DETECTED NOT DETECTED   Listeria monocytogenes NOT DETECTED NOT DETECTED   Staphylococcus species NOT DETECTED NOT DETECTED   Staphylococcus aureus (BCID) NOT DETECTED NOT DETECTED   Staphylococcus epidermidis NOT DETECTED NOT DETECTED   Staphylococcus lugdunensis NOT DETECTED NOT DETECTED   Streptococcus species NOT DETECTED NOT DETECTED   Streptococcus agalactiae NOT DETECTED NOT DETECTED   Streptococcus pneumoniae NOT DETECTED NOT DETECTED   Streptococcus pyogenes NOT DETECTED NOT DETECTED   A.calcoaceticus-baumannii NOT DETECTED NOT DETECTED   Bacteroides fragilis NOT DETECTED NOT DETECTED   Enterobacterales DETECTED (A) NOT DETECTED   Enterobacter cloacae complex NOT DETECTED NOT DETECTED   Escherichia coli NOT DETECTED NOT DETECTED   Klebsiella aerogenes NOT DETECTED NOT DETECTED   Klebsiella oxytoca NOT  DETECTED NOT DETECTED   Klebsiella pneumoniae NOT DETECTED NOT DETECTED   Proteus species NOT DETECTED NOT DETECTED   Salmonella species NOT DETECTED NOT DETECTED   Serratia marcescens NOT DETECTED NOT DETECTED   Haemophilus influenzae NOT DETECTED NOT DETECTED   Neisseria meningitidis NOT DETECTED NOT DETECTED   Pseudomonas aeruginosa NOT DETECTED NOT DETECTED   Stenotrophomonas maltophilia NOT DETECTED NOT DETECTED   Candida albicans NOT DETECTED NOT DETECTED   Candida auris NOT DETECTED NOT DETECTED   Candida glabrata NOT DETECTED NOT DETECTED   Candida krusei NOT DETECTED NOT DETECTED   Candida parapsilosis NOT DETECTED NOT DETECTED   Candida tropicalis NOT DETECTED NOT DETECTED   Cryptococcus neoformans/gattii NOT DETECTED NOT DETECTED   CTX-M ESBL NOT DETECTED NOT DETECTED   Carbapenem resistance IMP NOT DETECTED NOT DETECTED   Carbapenem resistance KPC NOT DETECTED NOT DETECTED   Carbapenem resistance NDM NOT DETECTED NOT DETECTED   Carbapenem resist OXA 48 LIKE NOT DETECTED NOT DETECTED   Carbapenem resistance VIM NOT DETECTED NOT DETECTED    [name] [name] 01/21/2020  5:35 PM

## 2020-02-04 NOTE — Progress Notes (Signed)
Attempted to contact pts son Trenise Turay via telephone who is the pts POA to obtain consent for temporary hemodialysis catheter placement.  However, he did not answer the phone therefore left voicemail message instructing Mr. Knapper to return my phone call.   Marda Stalker, [place] Pager [phone] (please enter 7 digits) PCCM Consult Pager [phone] (please enter 7 digits)

## 2020-02-04 NOTE — Procedures (Signed)
Arterial Catheter Insertion Procedure Note  Gabrielle [name]  [id number]  [birth year]  Date:02/04/2020  Time:6:41 AM    Provider Performing: Bradly Bienenstock    Procedure: Insertion of Arterial Line [id number]) with US guidance (82500)   Indication(s) Blood pressure monitoring and/or need for frequent ABGs  Consent Unable to obtain consent due to emergent nature of procedure.  Anesthesia None   Time Out Verified patient identification, verified procedure, site/side was marked, verified correct patient position, special equipment/implants available, medications/allergies/relevant history reviewed, required imaging and test results available.   Sterile Technique Maximal sterile technique including full sterile barrier drape, hand hygiene, sterile gown, sterile gloves, mask, hair covering, sterile ultrasound probe cover (if used).   Procedure Description Area of catheter insertion was cleaned with chlorhexidine and draped in sterile fashion. With real-time ultrasound guidance an arterial catheter was placed into the left femoral artery.  Appropriate arterial tracings confirmed on monitor.     Complications/Tolerance None; patient tolerated the procedure well.   EBL Minimal   Specimen(s) None   BIOPATCH applied to the insertion site.     Darel Hong, AGACNP-BC [facility] Pager: [phone]

## 2020-02-04 NOTE — Consult Note (Signed)
[facility] CONSULT NOTE    Date: 01/12/2020                  Patient Name:  Gabrielle [name]  MRN: [id number]  DOB: 23-Feb-1950  Age / Sex: 69 y.o., female         PCP: System, Provider Not In                 Service Requesting Consult:  Critical care                 Reason for Consult:  Acute kidney injury/chronic kidney disease stage IV            History of Present Illness: Patient is a 69 y.o. female with a PMHx of multiple alcohol, COPD, systolic and diastolic heart failure, morbid obesity, diabetes mellitus type 2, hypertension, anemia of chronic kidney disease, lower extremity cellulitis, chronic kidney disease stage IV baseline creatinine 2.87, osteoarthritis, who was admitted to [facility] on 02/02/2020 for evaluation of septic shock in the setting of UTI as well as severe acute kidney injury and anion gap metabolic acidosis.  Patient unable to offer detailed history at this point time.  She resides at [facility] and was found to have altered mental status.  We are asked to see her for evaluation management of acute kidney injury.  Her recent baseline creatinine was 2.86 with an EGFR of 17.  Currently creatinine up to 4.4 with a BUN of 64.  Potassium also bit elevated at 5.5.  Serum bicarbonate low at 14 with elevated lactic acid level of 10.5.  She appears to have septic shock and source appears to be urinary tract infection.  She was administered multiple antibiotics in the emergency room and is currently being given ceftriaxone.  Patient currently requiring vasopressor support with norepinephrine and vasopressin.   Medications: Outpatient medications: Medications Prior to Admission  Medication Sig Dispense Refill Last Dose  . acetaminophen (TYLENOL) 500 MG tablet Take 500 mg by mouth 3 (three) times daily.   02/03/2020 at 1000  . benztropine (COGENTIN) 1 MG tablet Take 1 mg by mouth 2 (two) times daily.    02/03/2020 at 1800  . bisacodyl (DULCOLAX) 5  MG EC tablet Take 10 mg by mouth daily as needed for moderate constipation or severe constipation.   prn at prn  . busPIRone (BUSPAR) 10 MG tablet Take 10 mg by mouth 3 (three) times daily.    02/03/2020 at 2200  . cholecalciferol (VITAMIN D) 25 MCG (1000 UNIT) tablet Take 1,000 Units by mouth daily.   02/03/2020 at 1000  . clobetasol cream (TEMOVATE) 9.38 % Apply 1 application topically in the morning and at bedtime. (apply to scalp, legs and arms)   02/03/2020 at Unknown time  . clopidogrel (PLAVIX) 75 MG tablet Take 1 tablet (75 mg total) by mouth daily. 30 tablet 0 02/03/2020 at 1000  . empagliflozin (JARDIANCE) 25 MG TABS tablet Take 1 tablet (25 mg total) by mouth daily before breakfast. 30 tablet 0 02/03/2020 at 1000  . ethacrynic acid (EDECRIN) 25 MG tablet Take 3 tablets (75 mg total) by mouth 2 (two) times daily. 60 tablet 0 02/03/2020 at 1704  . Exenatide ER (BYDUREON BCISE) 2 MG/0.85ML AUIJ Inject 2 mg into the skin every Wednesday.   Past Week at Unknown time  . fluticasone (FLONASE) 50 MCG/ACT nasal spray Place 1 spray into both nostrils every 12 (twelve) hours as needed for allergies.    prn at  prn  . HUMALOG 100 UNIT/ML cartridge Inject 5 Units into the skin See admin instructions. Inject 5u under the skin three times daily with meals with sliding scale   02/03/2020 at 1600  . HYDROcodone-acetaminophen (NORCO/VICODIN) 5-325 MG tablet Take 1 tablet by mouth every 6 (six) hours as needed for up to 10 doses for moderate pain or severe pain. 10 tablet 0 prn at prn  . Infant Care Products [facility]) OINT Apply 1 application topically daily.   02/03/2020 at Unknown time  . insulin glargine (LANTUS) 100 UNIT/ML injection Inject 0.4 mLs (40 Units total) into the skin daily. 10 mL 11 02/03/2020 at 1104  . Melatonin 3 MG CAPS Take 3 mg by mouth at bedtime as needed.   prn at prn  . nabumetone (RELAFEN) 500 MG tablet Take 500 mg by mouth daily.    02/03/2020 at 1000  . nitroGLYCERIN (NITROSTAT)  0.4 MG SL tablet Place 0.4 mg under the tongue every 5 (five) minutes as needed for chest pain.   prn at prn  . nystatin cream (MYCOSTATIN) Apply 1 application topically 2 (two) times daily. (apply to perennial area)   02/03/2020 at Unknown time  . perphenazine (TRILAFON) 2 MG tablet Take 2 mg by mouth 2 (two) times daily.   02/03/2020 at 1800  . polyethylene glycol (MIRALAX / GLYCOLAX) packet Take 17 g by mouth daily.   02/03/2020 at 1000  . promethazine (PHENERGAN) 25 MG tablet Take 25 mg by mouth every 6 (six) hours as needed.   prn at prn  . senna (SENOKOT) 8.6 MG TABS tablet Take 2 tablets by mouth daily.   02/03/2020 at 1000  . sodium chloride (OCEAN) 0.65 % SOLN nasal spray Place 2 sprays into both nostrils every 8 (eight) hours as needed for congestion.   prn at prn  . vitamin B-12 (CYANOCOBALAMIN) 1000 MCG tablet Take 1,000 mcg by mouth every Monday, Wednesday, and Friday.   02/03/2020 at 1000    Current medications: Current Facility-Administered Medications  Medication Dose Route Frequency Provider Last Rate Last Admin  . 0.9 %  sodium chloride infusion  250 mL Intravenous Continuous Darel Hong D, NP      . 0.9 %  sodium chloride infusion  250 mL Intravenous Continuous Hinda Kehr, MD      . acetaminophen (TYLENOL) tablet 650 mg  650 mg Oral Q4H PRN Darel Hong D, NP      . cefTRIAXone (ROCEPHIN) 2 g in sodium chloride 0.9 % 100 mL IVPB  2 g Intravenous Q24H Bradly Bienenstock, NP      . Chlorhexidine Gluconate Cloth 2 % PADS 6 each  6 each Topical Daily Tyler Pita, MD   6 each at 01/19/2020 [phone]  . collagenase (SANTYL) ointment   Topical Daily Odeal Welden, MD      . dextrose 50 % solution 50 mL  1 ampule Intravenous Once Darel Hong D, NP      . docusate sodium (COLACE) capsule 100 mg  100 mg Oral BID PRN Bradly Bienenstock, NP      . Gerhardt's butt cream   Topical BID Canuto Kingston, MD      . heparin injection 5,000 Units  5,000 Units Subcutaneous Q8H  Darel Hong D, NP   5,000 Units at 01/27/2020 0654  . insulin aspart (novoLOG) injection 0-15 Units  0-15 Units Subcutaneous Q4H Darel Hong D, NP   8 Units at 01/17/2020 [phone]  . insulin aspart (novoLOG) injection 10 Units  10 Units Subcutaneous Once Renda Rolls, RPH      . magnesium sulfate IVPB 2 g 50 mL  2 g Intravenous Once Hinda Kehr, MD      . norepinephrine (LEVOPHED) 16 mg in 2100m premix infusion  0-40 mcg/min Intravenous Titrated KDarel HongD, NP 7.5 mL/hr at 01/16/2020 0926 8 mcg/min at 01/10/2020 0926  . ondansetron (ZOFRAN) injection 4 mg  4 mg Intravenous Q6H PRN KDarel HongD, NP      . pantoprazole (PROTONIX) injection 40 mg  40 mg Intravenous Q24H KDarel HongD, NP   40 mg at 01/09/2020 0820  . patiromer (Daryll Drown packet 16.8 g  16.8 g Oral Daily KDarel HongD, NP      . polyethylene glycol (MIRALAX / GLYCOLAX) packet 17 g  17 g Oral Daily PRN KDarel HongD, NP      . sodium bicarbonate 150 mEq in sterile water 1,000 mL infusion   Intravenous Continuous KBradly Bienenstock NP 75 mL/hr at 01/20/2020 0600 Infusion Verify at 01/10/2020 0600  . sodium bicarbonate injection 50 mEq  50 mEq Intravenous Once FHinda Kehr MD      . vasopressin (PITRESSIN) 20 Units in sodium chloride 0.9 % 100 mL infusion-*FOR SHOCK*  0-0.03 Units/min Intravenous Continuous BAwilda Bill NP 9 mL/hr at 01/29/2020 0825 0.03 Units/min at 01/08/2020 0825      Allergies: Allergies  Allergen Reactions  . Lasix [Furosemide]     Numbness and tingling of the face   . Ultram [Tramadol] Other (See Comments)    Pt reports, "makes my head feel funny."  . Erythromycin Rash  . Plasticized Base [Plastibase] Rash  . Tetracyclines & Related Rash      Past Medical History: Past Medical History:  Diagnosis Date  . Allergy    seasonal allergies  . Anemia    anemia of chronic kidney disease  . Arthritis   . Cellulitis and abscess of left leg   . Chronic kidney disease    she reports  grade 2 kidney disease  . Chronic venous stasis dermatitis of both lower extremities   . [facility]   . Hypertension   . Morbid obesity ([facility]   . Osteoarthritis   . Oxygen dependent   . Skin cancer   . Sleep apnea      Past Surgical History: Past Surgical History:  Procedure Laterality Date  . NO PAST SURGERIES    . none       Family History: Family History  Problem Relation Age of Onset  . Diabetes Mellitus II Father   . CAD Father   . CAD Mother   . Hiatal hernia Mother      Social History: Social History   Socioeconomic History  . Marital status: Single    Spouse name: Not on file  . Number of children: Not on file  . Years of education: Not on file  . Highest education level: Not on file  Occupational History  . Occupation: disabled  Tobacco Use  . Smoking status: Never Smoker  . Smokeless tobacco: Never Used  Vaping Use  . Vaping Use: Never used  Substance and Sexual Activity  . Alcohol use: No  . Drug use: No  . Sexual activity: Not Currently  Other Topics Concern  . Not on file  Social History Narrative  . Not on file   Social Determinants of Health   Financial Resource Strain: Not on file  Food Insecurity: Not  on file  Transportation Needs: Not on file  Physical Activity: Not on file  Stress: Not on file  Social Connections: Not on file  Intimate Partner Violence: Not on file     Review of Systems: Patient unable to provide accurate review of systems due to altered mental status.  Vital Signs: Blood pressure (!) 89/57, pulse (!) 115, temperature (!) 96.5 F (35.8 C), temperature source Axillary, resp. rate (!) 25, height _0  (1.676 m), weight 120.3 kg, SpO2 100 %.  Weight trends: Filed Weights   01/11/2020 0230  Weight: 120.3 kg    Physical Exam: Physical Exam: General:  Critically ill-appearing  Head:  Normocephalic, atraumatic.  Dry oral mucosal membranes  Eyes:  Anicteric  Neck:  Supple   Lungs:   Clear to auscultation, normal effort  Heart:  S1S2 no rubs  Abdomen:   Soft, nontender, bowel sounds present  Extremities:  Trace peripheral edema.  Neurologic:  Arousable but confused  Skin:  Slightly cool to touch  Access:  No dialysis access at the moment    Lab results: Basic Metabolic Panel: Recent Labs  Lab 01/14/2020 0252  NA 138  K 5.5*  CL 104  CO2 14*  GLUCOSE 265*  BUN 64*  CREATININE 4.48*  CALCIUM 9.3  MG 2.4    Liver Function Tests: Recent Labs  Lab 01/15/2020 0252  AST 41  ALT 17  ALKPHOS 176*  BILITOT 0.8  PROT 6.9  ALBUMIN 1.9*   No results for input(s): LIPASE, AMYLASE in the last 168 hours. No results for input(s): AMMONIA in the last 168 hours.  CBC: Recent Labs  Lab 02/07/2020 0252  WBC 11.7*  NEUTROABS 9.4*  HGB 9.6*  HCT 32.5*  MCV 105.9*  PLT 323    Cardiac Enzymes: No results for input(s): CKTOTAL, CKMB, CKMBINDEX, TROPONINI in the last 168 hours.  BNP: Invalid input(s): POCBNP  CBG: Recent Labs  Lab 01/19/2020 0456 02/03/2020 0723  GLUCAP 249* 262*    Microbiology: Results for orders placed or performed during the hospital encounter of 02/01/2020  Blood Culture (routine x 2)     Status: None (Preliminary result)   Collection Time: 01/28/2020  2:52 AM   Specimen: BLOOD  Result Value Ref Range Status   Specimen Description BLOOD LEFT ASSIST CONTROL  Final   Special Requests   Final    BOTTLES DRAWN AEROBIC AND ANAEROBIC Blood Culture adequate volume   Culture   Final    NO GROWTH < 12 HOURS Performed at [facility] [address] [place], [place] [address]    Report Status PENDING  Incomplete  Resp Panel by RT-PCR (Flu A&B, Covid)     Status: None   Collection Time: 01/24/2020  2:52 AM   Specimen: Nasopharyngeal(NP) swabs in vial transport medium  Result Value Ref Range Status   SARS Coronavirus 2 by RT PCR NEGATIVE NEGATIVE Final    Comment: (NOTE) SARS-CoV-2 target nucleic acids are NOT  DETECTED.  The SARS-CoV-2 RNA is generally detectable in upper respiratory specimens during the acute phase of infection. The lowest concentration of SARS-CoV-2 viral copies this assay can detect is 138 copies/mL. A negative result does not preclude SARS-Cov-2 infection and should not be used as the sole basis for treatment or other patient management decisions. A negative result may occur with  improper specimen collection/handling, submission of specimen other than nasopharyngeal swab, presence of viral mutation(s) within the areas targeted by this assay, and inadequate number of viral copies(<138 copies/mL).  A negative result must be combined with clinical observations, patient history, and epidemiological information. The expected result is Negative.  Fact Sheet for Patients:  EntrepreneurPulse.com.au  Fact Sheet for Healthcare Providers:  IncredibleEmployment.be  This test is no t yet approved or cleared by the [place] FDA and  has been authorized for detection and/or diagnosis of SARS-CoV-2 by FDA under an Emergency Use Authorization (EUA). This EUA will remain  in effect (meaning this test can be used) for the duration of the COVID-19 declaration under Section 564(b)(1) of the Act, 21 U.S.C.section 360bbb-3(b)(1), unless the authorization is terminated  or revoked sooner.       Influenza A by PCR NEGATIVE NEGATIVE Final   Influenza B by PCR NEGATIVE NEGATIVE Final    Comment: (NOTE) The Xpert Xpress SARS-CoV-2/FLU/RSV plus assay is intended as an aid in the diagnosis of influenza from Nasopharyngeal swab specimens and should not be used as a sole basis for treatment. Nasal washings and aspirates are unacceptable for Xpert Xpress SARS-CoV-2/FLU/RSV testing.  Fact Sheet for Patients: EntrepreneurPulse.com.au  Fact Sheet for Healthcare Providers: IncredibleEmployment.be  This test is not yet  approved or cleared by the [place] FDA and has been authorized for detection and/or diagnosis of SARS-CoV-2 by FDA under an Emergency Use Authorization (EUA). This EUA will remain in effect (meaning this test can be used) for the duration of the COVID-19 declaration under Section 564(b)(1) of the Act, 21 U.S.C. section 360bbb-3(b)(1), unless the authorization is terminated or revoked.  Performed at [facility], [address] [place], [place] [address]   Blood Culture (routine x 2)     Status: None (Preliminary result)   Collection Time: 01/25/2020  2:53 AM   Specimen: BLOOD  Result Value Ref Range Status   Specimen Description BLOOD BLOOD LEFT WRIST  Final   Special Requests   Final    BOTTLES DRAWN AEROBIC AND ANAEROBIC Blood Culture results may not be optimal due to an inadequate volume of blood received in culture bottles   Culture   Final    NO GROWTH < 12 HOURS Performed at [facility], [address] [place], [place] [address]    Report Status PENDING  Incomplete    Coagulation Studies: Recent Labs    01/29/2020 0252  LABPROT 13.6  INR 1.1    Urinalysis: Recent Labs    01/22/2020 0252  COLORURINE YELLOW*  LABSPEC 1.015  PHURINE 5.0  GLUCOSEU NEGATIVE  HGBUR LARGE*  BILIRUBINUR NEGATIVE  KETONESUR NEGATIVE  PROTEINUR 100*  NITRITE NEGATIVE  LEUKOCYTESUR MODERATE*      Imaging: DG Chest Port 1 View  Result Date: 02/03/2020 CLINICAL DATA:  Encephalopathy EXAM: PORTABLE CHEST 1 VIEW COMPARISON:  01/25/2020 FINDINGS: The heart size and mediastinal contours are within normal limits. Both lungs are clear. The visualized skeletal structures are unremarkable. IMPRESSION: No active disease. Electronically Signed   By: Ulyses Jarred M.D.   On: 01/24/2020 03:50      Assessment & Plan: Pt is a 69 y.o. female with a PMHx of multiple alcohol, COPD, systolic and diastolic heart failure, morbid obesity, diabetes mellitus type 2, hypertension,  anemia of chronic kidney disease, lower extremity cellulitis, chronic kidney disease stage IV baseline creatinine 2.87, osteoarthritis, who was admitted to [facility] on 02/02/2020 for evaluation of septic shock in the setting of UTI as well as severe acute kidney injury and anion gap metabolic acidosis.  1.  Acute kidney injury secondary to ATN/chronic kidney disease stage IV baseline creatinine  2.86 with an EGFR of 17/metabolic acidosis.  Acute kidney injury now likely due to septic shock and resultant ATN.  I agree with aggressive fluid resuscitation being given with sodium bicarbonate drip.  No immediate need for dialysis though this may need to be considered within the next 24 to 48 hours should her renal function continue to deteriorate and urine output remained less than 500 cc over 24 hours.  Check renal ultrasound to make sure no underlying obstruction.  Will also check SPEP, UPEP, ANA, ANCA antibodies, GBM antibodies  2.  Hyperkalemia.  Serum potassium 5.5.  Likely being driven by acidosis.  Continue hydration for now.  3.  Septic shock/hypotension.  Agree with pressors to maintain a map of 65 or greater.  Antibiotic selection as per critical care but patient currently on ceftriaxone.  4.  Anemia of chronic kidney disease.  Hemoglobin currently 9.6.  No immediate need for Epogen.  Monitor CBC.

## 2020-02-04 NOTE — Consult Note (Signed)
WOC Nurse Consult Note: Reason for Consult: Chronic nonhealing wounds to buttocks, ischial tuberosities and perianal skin. Moisture associated skin damage, full thickness injury.  Patient has ongoing diarrhea and acute UTI with urinary purulence.  Will keep skin clean and dry and apply silver hydrofiber dressings to promote healing and manage moisture.  Bleeding was excessive last visit with shear and pressure contributing to nonhealing state.  Wound type:MASD, pressure and shearing Pressure Injury POA: Yes Measurement: Bilateral ischium:  4 cmx 3 cm x 0.2 cm  Sacrum:  1.5 cm x 1 cm x 0.2 cm  Circumferential breakdown to perianal region Left lower leg:  2 cm x 2 cm x 0.2 cm lesion with slough Wound bed:red and moist Drainage (amount, consistency, odor) minimal serosanguinous  No odor.  Periwound:moist and macerated Dressing procedure/placement/frequency:  Cleanse perianal skin with soap and water.  Apply Gerhardts butt paste twice daily and PRN soilage.   Cleanse buttocks/sacral and ischial wounds with soap and water and pat dry.  Apply Aquacel Ag to open wounds.  Cover with ABD pad and tape.  Change daily and PRN soilage.  Cleanse left leg with NS and pat dry.  Apply Santyl to wound bed and cover with NS moist gauze. Cover with dry gauze and kerlix/tape.  Change daily.  Will not follow at this time.  Please re-consult if needed.  Domenic Moras MSN, RN, FNP-BC [facility] Wound, Ostomy, Continence Nurse Pager [phone]

## 2020-02-04 NOTE — ED Notes (Signed)
MD Karma Greaser made aware of BP at this time,

## 2020-02-04 NOTE — H&P (Signed)
NAME:  Gabrielle [name], MRN:  [id number], DOB:  [birth year], LOS: 0 ADMISSION DATE:  01/19/2020, CONSULTATION DATE:  02/02/2020 REFERRING MD:  Dr. Karma Greaser, CHIEF COMPLAINT:  Altered Mental Status   Brief History:  69 y.o. Female admitted with Septic Shock in the setting of UTI, along with AKI  and severe anion gap metabolic acidosis.  History of Present Illness:  Gabrielle [name] is a 69 y.o. Female with a past medical history as listed below who presents to [facility] ED on 01/22/2020 from [facility] due to Altered Mental Status.  Pt remains altered at this time, therefore history is obtained from ED and nursing notes.  Per notes, her nursing facility reports she has not been exhibiting her normal behavior for about 3 to 4 days, which has worsened over time.  Lab work and Urinalysis were performed at the facility yesterday which reportedly has bicarb of 12 and grossly positive UA.  Reportedly no treatment was initiated.  Last night she had decreased level of consciousness prompting sending her to the ED for further evaluation.  ED Course: Upon presentation to the ED vital signs included temperature 95.3 rectally, RR 16, HR 121, BP 114/95, O2 saturations 100% on 2L nasal cannula. Workup revealed WBC 11.7 w/ neutrophilia and mild left shift, procalcitonin 5.38, lactic acid 9.2, potassium 5.5, bicarb 14, anion gap 20, BUN 64, Creatinine 4.48, alkaline phosphatase 176, and glucose 265. Venous blood gas with pH 7.11, pCO2 35, pO2 31, and Bicarb 11.1.  Chest x-ray without acute disease, and Urinalysis is consistent with UTI.  Her COVID-19 PCR and Influenza PCR are both negative.   She met sepsis criteria, therefore she received IV fluid resuscitation and broad spectrum antibiotics (Cefepime, Flagyl, and Vancomycin).  While in the ED she became hypotensive refractory to IV fluids, therefore she was placed on Levophed drip.  PCCM is asked to admit the patient to ICU for further workup and treatment of Septic  Shock in the setting of UTI, along with AKI and severe anion gap metabolic acidosis requiring Bicarb drip.  Nephrology has been consulted.   Of note, she was recently admitted to [facility] from 01/25/20 to 01/26/20 for bleeding from her chronic sacral decubitus ulcers.   Past Medical History:  OSA COPD on 2L Supplemental O2 Combined Systolic & Diastolic CHF Morbid Obesity Diabetes Mellitus Hypertension CKD Anemia Cellulitis of Left lower extremity Osteoarthritis  Significant Hospital Events:  12/28: Admit to ICU  Consults:  PCCM Nephrology  Procedures:  12/28: Left femoral CVC placed 12/28: Left femoral arterial line placed  Significant Diagnostic Tests:  12/28: CXR>>No active disease. 12/28: RUQ Abdominal US>>  Micro Data:  12/28: SARS-CoV-2 PCR>>negative 12/28: Influenza PCR>>negative 12/28: Blood culture x2>> 12/28: Urine >>  Antimicrobials:  Cefepime x1 dose 12/28 Flagyl x1 dose 12/28 Vancomycin x1 dose 12/28 Ceftriaxone 12/28>>  Interim History / Subjective:  Pt is awake but altered, currently protecting her airway Able to nod to yes/no questions Levophed gtt initiated due to hypotension On 2L [place] Does complain of abdominal pain upon palpitation, but no guarding or rebound tenderness, abdomen is soft  Objective   Blood pressure 125/76, pulse (!) 118, temperature (!) 95.3 F (35.2 C), temperature source Rectal, resp. rate (!) 21, height _0  (1.676 m), weight 120.3 kg, SpO2 100 %.        Intake/Output Summary (Last 24 hours) at 02/03/2020 0407 Last data filed at 02/02/2020 0358 Gross per 24 hour  Intake 200 ml  Output --  Net 200 ml  Filed Weights   01/19/2020 0230  Weight: 120.3 kg    Examination: General: Acute on chronically ill appearing female, laying in bed, on 2L [place], in NAD HENT: Atraumatic, normocephalic, neck supple, no JVD Lungs: Clear to auscultation bilaterally, no rales or wheezing, even, nonlabored Cardiovascular: Tachycardia,  regular rhythm, s1s2, no M/R/G Abdomen: Obese, soft, generalized tenderness, no guarding or rebound tenderness, BS+ x4 Extremities: No gross deformities, no edema Neuro: Awake and alert, oriented only to self, follows commands, no focal deficits GU: Grossly purulent urine.  Chronic pressure wounds around her anus and on sacrum which are bleeding Skin: Chronic sacral decubs and pressure wounds around the anus   Resolved Hospital Problem list   N/A  Assessment & Plan:   Septic Shock Chronic combined Systolic & Diastolic CHF without acute exacerbation Hx: HTN -Continuous cardiac monitoring -Maintain MAP >65 -Received IV Fluid resuscitation in ED -Vasopressors if needed to maintain MAP goal -Trend lactic acid -Check troponin -Obtain 2D Echocardiogram   Severe Sepsis in the setting of UTI -Monitor fever curve -Trend WBC's & Procalcitonin -Follow cultures as above -Place on Rocephin for now pending cultures and sensitivities   AKI on CKD Anion Gap Metabolic Acidosis in the setting of lactic acidosis Mild Hyperkalemia -Monitor I&O's / urinary output -Follow BMP -Ensure adequate renal perfusion -Avoid nephrotoxic agents as able -Replace electrolytes as indicated -Bicarb gtt -Given 10 units insulin, 1 amp bicarb, and Veltassa for Hyperkalemia -Consult Nephrology, appreciate input -Repeat BMP @ 12:00 today   Elevated Alkaline Phosphatase Abdominal pain -NPO for now -Trend LFT's -Obtain RUQ abdominal US -Consider CT Abdomen/Pelvis if worsening lactic acidosis   Chronic Hypoxic Respiratory Failure (on 2L supplemental O2 at baseline) COPD without acute exacerbation -Supplemental O2 as needed to maintain O2 sats 88 to 94% -Currently maintaining her airway, but high risk for intubation due to sepsis and severe metabolic derangements  -Follow intermittent CXR & ABG as needed -Prn Bronchodilators   Anemia of Chronic Disease -Monitor for S/Sx of bleeding -Trend  CBC -Heparin SQ for VTE Prophylaxis  -Transfuse for Hgb <7   Diabetes Mellitus -CBG's -Sliding scale insulin -Follow ICU Hypo/hyperglycemia protocol -Check Hgb A1c   Chronic Sacral Decubitus Ulcerations and Chronic wounds around anus -Consult Wound care, appreciate input        Pt is critically ill, prognosis is guarded.  She is at high risk for cardiac arrest and death.     Best practice (evaluated daily)  Diet: NPO for now until mentation improved Pain/Anxiety/Delirium protocol (if indicated): N/A VAP protocol (if indicated): N/A DVT prophylaxis: Heparin SQ GI prophylaxis: Protonix Glucose control: SSI Mobility: As tolerated Disposition:ICU  Goals of Care:  Last date of multidisciplinary goals of care discussion: N/A Family and staff present: Updated Pt's sister Bethena Roys at bedside, pt's son is commuting from [place] Summary of discussion: Septic shock due to UTI requiring Vasopressors.  AKI that may need dialysis in the near future. Follow up goals of care discussion due: 2020/02/16 Code Status: Full code  Labs   CBC: Recent Labs  Lab 01/30/2020 0252  WBC PENDING  NEUTROABS PENDING  HGB 9.6*  HCT 32.5*  MCV 105.9*  PLT 224    Basic Metabolic Panel: Recent Labs  Lab 01/23/2020 0252  NA 138  K 5.5*  CL 104  CO2 14*  GLUCOSE 265*  BUN 64*  CREATININE 4.48*  CALCIUM 9.3  MG 2.4   GFR: Estimated Creatinine Clearance: 16.1 mL/min (A) (by C-G formula based on SCr of 4.48 mg/dL (  H)). Recent Labs  Lab 01/08/2020 0252  PROCALCITON 5.38  WBC PENDING  LATICACIDVEN 9.2*    Liver Function Tests: Recent Labs  Lab 01/13/2020 0252  AST 41  ALT 17  ALKPHOS 176*  BILITOT 0.8  PROT 6.9  ALBUMIN 1.9*   No results for input(s): LIPASE, AMYLASE in the last 168 hours. No results for input(s): AMMONIA in the last 168 hours.  ABG    Component Value Date/Time   PHART 7.45 05/03/2016 1110   PCO2ART 70 (HH) 05/03/2016 1110   PO2ART 69 (L) 05/03/2016 1110    HCO3 11.1 (L) 01/31/2020 0252   ACIDBASEDEF 17.3 (H) 01/10/2020 0252   O2SAT 34.7 01/15/2020 0252     Coagulation Profile: Recent Labs  Lab 02/01/2020 0252  INR 1.1    Cardiac Enzymes: No results for input(s): CKTOTAL, CKMB, CKMBINDEX, TROPONINI in the last 168 hours.  HbA1C: Hgb A1c MFr Bld  Date/Time Value Ref Range Status  01/17/2020 04:32 AM 5.7 (H) 4.8 - 5.6 % Final    Comment:    (NOTE) Pre diabetes:          5.7%-6.4%  Diabetes:              >6.4%  Glycemic control for   <7.0% adults with diabetes   08/14/2019 04:52 AM 8.3 (H) 4.8 - 5.6 % Final    Comment:    (NOTE) Pre diabetes:          5.7%-6.4%  Diabetes:              >6.4%  Glycemic control for   <7.0% adults with diabetes     CBG: No results for input(s): GLUCAP in the last 168 hours.  Review of Systems:   Unable to assess due to AMS and critical illness  Past Medical History:  She,  has a past medical history of Allergy, Anemia, Arthritis, Cellulitis and abscess of left leg, Chronic kidney disease, Chronic venous stasis dermatitis of both lower extremities, Diabetes mellitus without complication ([place]), Hypertension, Morbid obesity ([place]), Osteoarthritis, Oxygen dependent, Skin cancer, and Sleep apnea.   Surgical History:   Past Surgical History:  Procedure Laterality Date  . NO PAST SURGERIES    . none       Social History:   reports that she has never smoked. She has never used smokeless tobacco. She reports that she does not drink alcohol and does not use drugs.   Family History:  Her family history includes CAD in her father and mother; Diabetes Mellitus II in her father; Hiatal hernia in her mother.   Allergies Allergies  Allergen Reactions  . Lasix [Furosemide]     Numbness and tingling of the face   . Ultram [Tramadol] Other (See Comments)    Pt reports, "makes my head feel funny."  . Erythromycin Rash  . Plasticized Base [Plastibase] Rash  . Tetracyclines & Related Rash      Home Medications  Prior to Admission medications   Medication Sig Start Date End Date Taking? Authorizing Provider  acetaminophen (TYLENOL) 500 MG tablet Take 500 mg by mouth 3 (three) times daily.    [provider]  benztropine (COGENTIN) 1 MG tablet Take 1 mg by mouth 2 (two) times daily.  04/26/17   [provider]  bisacodyl (DULCOLAX) 5 MG EC tablet Take 10 mg by mouth daily as needed for moderate constipation or severe constipation.    [provider]  busPIRone (BUSPAR) 10 MG tablet Take 10 mg by  mouth 3 (three) times daily.     [provider]  clobetasol cream (TEMOVATE) 9.02 % Apply 1 application topically in the morning and at bedtime. (apply to scalp, legs and arms)    [provider]  clopidogrel (PLAVIX) 75 MG tablet Take 1 tablet (75 mg total) by mouth daily. 08/15/19 08/14/20  Max Sane, MD  docusate sodium (COLACE) 100 MG capsule Take 100 mg by mouth daily.     [provider]  empagliflozin (JARDIANCE) 25 MG TABS tablet Take 1 tablet (25 mg total) by mouth daily before breakfast. 08/15/19   Max Sane, MD  ethacrynic acid (EDECRIN) 25 MG tablet Take 3 tablets (75 mg total) by mouth 2 (two) times daily. 05/04/16   Vaughan Basta, MD  Exenatide ER (BYDUREON BCISE) 2 MG/0.85ML AUIJ Inject 2 mg into the skin every Wednesday.    [provider]  fluticasone (FLONASE) 50 MCG/ACT nasal spray Place 1 spray into both nostrils every 12 (twelve) hours as needed for allergies.     [provider]  HUMALOG 100 UNIT/ML cartridge Inject 5 Units into the skin See admin instructions. Inject 5u under the skin three times daily with meals with sliding scale    [provider]  HYDROcodone-acetaminophen (NORCO/VICODIN) 5-325 MG tablet Take 1 tablet by mouth every 6 (six) hours as needed for up to 10 doses for moderate pain or severe pain. 01/26/20   Fritzi Mandes, MD  [employer] [facility]) OINT Apply 1  application topically daily.    [provider]  insulin glargine (LANTUS) 100 UNIT/ML injection Inject 0.4 mLs (40 Units total) into the skin daily. 01/26/20   Fritzi Mandes, MD  ipratropium-albuterol (DUONEB) 0.5-2.5 (3) MG/3ML SOLN Take 3 mLs by nebulization every 4 (four) hours as needed (congestion).    [provider]  nabumetone (RELAFEN) 500 MG tablet Take 500 mg by mouth daily.     [provider]  nitroGLYCERIN (NITROSTAT) 0.4 MG SL tablet Place 0.4 mg under the tongue every 5 (five) minutes as needed for chest pain.    [provider]  nystatin cream (MYCOSTATIN) Apply 1 application topically 2 (two) times daily. (apply to perennial area)    [provider]  omeprazole (PRILOSEC) 20 MG capsule Take 20 mg by mouth every other day.    [provider]  polyethylene glycol (MIRALAX / GLYCOLAX) packet Take 17 g by mouth daily.    [provider]  sodium chloride (OCEAN) 0.65 % SOLN nasal spray Place 2 sprays into both nostrils every 8 (eight) hours as needed for congestion.    [provider]  vitamin B-12 (CYANOCOBALAMIN) 1000 MCG tablet Take 1,000 mcg by mouth every Monday, Wednesday, and Friday.    [provider]     Critical care time: 60 minutes     Darel Hong, [facility] [place] [facility] Pager: [phone]

## 2020-02-04 NOTE — Procedures (Signed)
Central Venous Catheter Insertion Procedure Note  Gabrielle [name]  [id number]  1950/07/13  Date:01/22/2020  Time:6:18 PM   Provider Performing:Ben Habermann [name]   Procedure: Insertion of Non-tunneled Central Venous Catheter(36556)with US guidance (85929)    Indication(s) Hemodialysis  Consent Risks of the procedure as well as the alternatives and risks of each were explained to the patient and/or caregiver.  Consent for the procedure was obtained and is signed in the bedside chart  Anesthesia Topical only with 1% lidocaine   Timeout Verified patient identification, verified procedure, site/side was marked, verified correct patient position, special equipment/implants available, medications/allergies/relevant history reviewed, required imaging and test results available.  Sterile Technique Maximal sterile technique including full sterile barrier drape, hand hygiene, sterile gown, sterile gloves, mask, hair covering, sterile ultrasound probe cover (if used).  Procedure Description Area of catheter insertion was cleaned with chlorhexidine and draped in sterile fashion.   With real-time ultrasound guidance a HD catheter was placed into the right internal jugular vein.  Nonpulsatile blood flow and easy flushing noted in all ports.  The catheter was sutured in place and sterile dressing applied.  Complications/Tolerance None; patient tolerated the procedure well. Chest X-ray is ordered to verify placement for internal jugular or subclavian cannulation.  Chest x-ray is not ordered for femoral cannulation.  EBL Minimal  Specimen(s) None  Gabrielle [name], Gabrielle [name] Pager [phone] (please enter 7 digits) PCCM Consult Pager [phone] (please enter 7 digits)

## 2020-02-04 NOTE — Progress Notes (Signed)
CODE SEPSIS - PHARMACY COMMUNICATION  **Broad Spectrum Antibiotics should be administered within 1 hour of Sepsis diagnosis**  Time Code Sepsis Called/Page Received: 0230  Antibiotics Ordered: Cefepime, Vancomycin, Flagyl  Time of 1st antibiotic administration: 0306  Renda Rolls, PharmD, [facility] 02/04/2020 3:08 AM

## 2020-02-04 NOTE — Progress Notes (Signed)
Initial Nutrition Assessment ° °DOCUMENTATION CODES:  ° °Morbid obesity ° °INTERVENTION:  ° °RD will monitor for diet advancement and add supplements when appropriate  ° °Pt at high refeed risk; recommend monitor potassium, magnesium and phosphorus labs once appetite improved ° °NUTRITION DIAGNOSIS:  ° °Inadequate oral intake related to acute illness as evidenced by NPO status. ° °GOAL:  ° °Patient will meet greater than or equal to 90% of their needs ° °MONITOR:  ° °PO intake,Supplement acceptance,Labs,Weight trends,Skin,I & O's ° °REASON FOR ASSESSMENT:  ° °Malnutrition Screening Tool °  ° °ASSESSMENT:  ° °69 y.o. female with a PMHx of COPD, systolic and diastolic heart failure, morbid obesity, diabetes mellitus type 2, hypertension, anemia of chronic kidney disease, lower extremity cellulitis, chronic kidney disease stage IV baseline creatinine 2.87 and osteoarthritis who was admitted to [facility] on 01/18/2020 for evaluation of septic shock in the setting of UTI as well as severe acute kidney injury and anion gap metabolic acidosis.  ° °Met with pt and pt's sister at bedside today. Pt is lethargic and oriented only to self so history obtained from pt's sister at bedside. Sister reports that at baseline, pt generally eats well but that over the past 6 months her appetite has been decreased and that she has not been eating much at all. Sister reports that pt has become bed bound at baseline and has been for the past two years. Per chart, pt is down 80lbs(24%) over the past 5-6 months; this is severe weight loss. Sister does endorse weight loss but is unsure of how much. Pt with numerous wounds. Pt is currently NPO on multiple pressors. RD will add supplements once diet is advanced. Pt is likely at high refeed risk.  ° °Medications reviewed and include: heparin, insulin, protonix, veltassa, Na bicarbonate, Mg sulfate, levophed, zosyn, vasopressin  °  °Labs reviewed: K 5.5(H), BUN 64(H), creat 4.48(H) °Wbc- 11.7(H), Hgb  9.6(L), Hct 32.5(L) °cbgs- 249, 262, 220 x 24 hrs °AIC 5.6- 12/28 ° °NUTRITION - FOCUSED PHYSICAL EXAM: ° °Flowsheet Row Most Recent Value  °Orbital Region No depletion  °Upper Arm Region No depletion  °Thoracic and Lumbar Region No depletion  °Buccal Region No depletion  °Temple Region Mild depletion  °Clavicle Bone Region Mild depletion  °Clavicle and Acromion Bone Region Mild depletion  °Scapular Bone Region No depletion  °Dorsal Hand No depletion  °Patellar Region Mild depletion  °Anterior Thigh Region Mild depletion  °Posterior Calf Region Mild depletion  °Edema (RD Assessment) Mild  °Hair Reviewed  °Eyes Reviewed  °Mouth Reviewed  °Skin Reviewed  °Nails Reviewed  °  ° °Diet Order:   °Diet Order   °       °  Diet NPO time specified  Diet effective now       °  °  °  °  ° °EDUCATION NEEDS:  ° °No education needs have been identified at this time ° °Skin:  Skin Assessment: Reviewed RN Assessment (ecchymosis, Stage II L & R buttocks, Stage III R thigh, Stage III L & R ischium) ° °Last BM:  12/28 ° °Height:  ° °Ht Readings from Last 1 Encounters:  °01/20/2020 5' 6" (1.676 m)  ° ° °Weight:  ° °Wt Readings from Last 1 Encounters:  °02/07/2020 120.3 kg  ° ° °Ideal Body Weight:  59 kg ° °BMI:  Body mass index is 42.8 kg/m². ° °Estimated Nutritional Needs:  ° °Kcal:  2300-2600kcal/dat ° °Protein:  115-130g/day ° °Fluid:  1.8L/day ° °    MS, RD, LDN Please refer to [facility] for RD and/or RD on-call/weekend/after hours pager

## 2020-02-04 NOTE — Progress Notes (Signed)
Gabrielle [name] following for Sepsis Protocol, communication took place with ordering physician and will use IBW  For fluids

## 2020-02-05 ENCOUNTER — Inpatient Hospital Stay: Payer: Medicare PPO

## 2020-02-05 DIAGNOSIS — A419 Sepsis, unspecified organism: Secondary | ICD-10-CM | POA: Diagnosis not present

## 2020-02-05 LAB — COMPREHENSIVE METABOLIC PANEL
ALT: 23 U/L (ref 0–44)
ALT: 21 U/L (ref 0–44)
AST: 71 U/L — ABNORMAL HIGH (ref 15–41)
AST: 83 U/L — ABNORMAL HIGH (ref 15–41)
Albumin: 1.4 g/dL — ABNORMAL LOW (ref 3.5–5.0)
Albumin: 1.1 g/dL — ABNORMAL LOW (ref 3.5–5.0)
Alkaline Phosphatase: 219 U/L — ABNORMAL HIGH (ref 38–126)
Alkaline Phosphatase: 204 U/L — ABNORMAL HIGH (ref 38–126)
Anion gap: 25 — ABNORMAL HIGH (ref 5–15)
Anion gap: 29 — ABNORMAL HIGH (ref 5–15)
BUN: 56 mg/dL — ABNORMAL HIGH (ref 8–23)
BUN: 59 mg/dL — ABNORMAL HIGH (ref 8–23)
CO2: 10 mmol/L — ABNORMAL LOW (ref 22–32)
CO2: 9 mmol/L — ABNORMAL LOW (ref 22–32)
Calcium: 8.1 mg/dL — ABNORMAL LOW (ref 8.9–10.3)
Calcium: 10.6 mg/dL — ABNORMAL HIGH (ref 8.9–10.3)
Chloride: 102 mmol/L (ref 98–111)
Chloride: 99 mmol/L (ref 98–111)
Creatinine, Ser: 4.17 mg/dL — ABNORMAL HIGH (ref 0.44–1.00)
Creatinine, Ser: 4.38 mg/dL — ABNORMAL HIGH (ref 0.44–1.00)
GFR, Estimated: 11 mL/min — ABNORMAL LOW (ref >60)
GFR, Estimated: 10 mL/min — ABNORMAL LOW (ref >60)
Glucose, Bld: 130 mg/dL — ABNORMAL HIGH (ref 70–99)
Glucose, Bld: 270 mg/dL — ABNORMAL HIGH (ref 70–99)
Potassium: 5.5 mmol/L — ABNORMAL HIGH (ref 3.5–5.1)
Potassium: 5.2 mmol/L — ABNORMAL HIGH (ref 3.5–5.1)
Sodium: 137 mmol/L (ref 135–145)
Sodium: 137 mmol/L (ref 135–145)
Total Bilirubin: 1 mg/dL (ref 0.3–1.2)
Total Bilirubin: 1 mg/dL (ref 0.3–1.2)
Total Protein: 5.5 g/dL — ABNORMAL LOW (ref 6.5–8.1)
Total Protein: 4.3 g/dL — ABNORMAL LOW (ref 6.5–8.1)

## 2020-02-05 LAB — CBC WITH DIFFERENTIAL/PLATELET
Abs Immature Granulocytes: 1.31 10*3/uL — ABNORMAL HIGH (ref 0.00–0.07)
Basophils Absolute: 0.1 10*3/uL (ref 0.0–0.1)
Basophils Relative: 1 %
Eosinophils Absolute: 0.1 10*3/uL (ref 0.0–0.5)
Eosinophils Relative: 1 %
HCT: 25.4 % — ABNORMAL LOW (ref 36.0–46.0)
Hemoglobin: 7.3 g/dL — ABNORMAL LOW (ref 12.0–15.0)
Immature Granulocytes: 9 %
Lymphocytes Relative: 14 %
Lymphs Abs: 2.1 10*3/uL (ref 0.7–4.0)
MCH: 32.3 pg (ref 26.0–34.0)
MCHC: 28.7 g/dL — ABNORMAL LOW (ref 30.0–36.0)
MCV: 112.4 fL — ABNORMAL HIGH (ref 80.0–100.0)
Monocytes Absolute: 0.8 10*3/uL (ref 0.1–1.0)
Monocytes Relative: 5 %
Neutro Abs: 10.7 10*3/uL — ABNORMAL HIGH (ref 1.7–7.7)
Neutrophils Relative %: 70 %
Platelets: 290 10*3/uL (ref 150–400)
RBC: 2.26 MIL/uL — ABNORMAL LOW (ref 3.87–5.11)
RDW: 20.7 % — ABNORMAL HIGH (ref 11.5–15.5)
Smear Review: NORMAL
WBC: 15.1 10*3/uL — ABNORMAL HIGH (ref 4.0–10.5)
nRBC: 7.7 % — ABNORMAL HIGH (ref 0.0–0.2)

## 2020-02-05 LAB — CBC
HCT: 29.4 % — ABNORMAL LOW (ref 36.0–46.0)
Hemoglobin: 9.1 g/dL — ABNORMAL LOW (ref 12.0–15.0)
MCH: 32.4 pg (ref 26.0–34.0)
MCHC: 31 g/dL (ref 30.0–36.0)
MCV: 104.6 fL — ABNORMAL HIGH (ref 80.0–100.0)
Platelets: 341 10*3/uL (ref 150–400)
RBC: 2.81 MIL/uL — ABNORMAL LOW (ref 3.87–5.11)
RDW: 20.6 % — ABNORMAL HIGH (ref 11.5–15.5)
WBC: 15.7 10*3/uL — ABNORMAL HIGH (ref 4.0–10.5)
nRBC: 3.1 % — ABNORMAL HIGH (ref 0.0–0.2)

## 2020-02-05 LAB — PROCALCITONIN
Procalcitonin: 6.93 ng/mL
Procalcitonin: 6.25 ng/mL

## 2020-02-05 LAB — GLOMERULAR BASEMENT MEMBRANE ANTIBODIES: GBM Ab: 4 units (ref 0–20)

## 2020-02-05 LAB — GLUCOSE, CAPILLARY
Glucose-Capillary: 117 mg/dL — ABNORMAL HIGH (ref 70–99)
Glucose-Capillary: 98 mg/dL (ref 70–99)

## 2020-02-05 LAB — PROTIME-INR
INR: 1.6 — ABNORMAL HIGH (ref 0.8–1.2)
Prothrombin Time: 18.8 seconds — ABNORMAL HIGH (ref 11.4–15.2)

## 2020-02-05 LAB — C4 COMPLEMENT: Complement C4, Body Fluid: 55 mg/dL — ABNORMAL HIGH (ref 12–38)

## 2020-02-05 LAB — TROPONIN I (HIGH SENSITIVITY)
Troponin I (High Sensitivity): 576 ng/L (ref <18)
Troponin I (High Sensitivity): 1005 ng/L (ref <18)

## 2020-02-05 LAB — APTT: aPTT: 78 seconds — ABNORMAL HIGH (ref 24–36)

## 2020-02-05 LAB — MPO/PR-3 (ANCA) ANTIBODIES
ANCA Proteinase 3: <3.5 U/mL (ref 0.0–3.5)
Myeloperoxidase Abs: <9 U/mL (ref 0.0–9.0)

## 2020-02-05 LAB — ANA W/REFLEX IF POSITIVE: Anti Nuclear Antibody (ANA): NEGATIVE

## 2020-02-05 LAB — LACTIC ACID, PLASMA: Lactic Acid, Venous: >11 mmol/L (ref 0.5–1.9)

## 2020-02-05 LAB — C3 COMPLEMENT: C3 Complement: 107 mg/dL (ref 82–167)

## 2020-02-05 MED ORDER — LORAZEPAM 2 MG/ML IJ SOLN: 2.0000 mg | INTRAMUSCULAR | Status: DC | PRN

## 2020-02-05 MED ORDER — POLYETHYLENE GLYCOL 3350 17 G PO PACK: 17.0000 g | PACK | Freq: Every day | ORAL | Status: DC | PRN

## 2020-02-05 MED ORDER — METOPROLOL TARTRATE 25 MG PO TABS
12.5000 mg | ORAL_TABLET | Freq: Four times a day (QID) | ORAL | Status: DC
Start: 2020-02-05 — End: 2020-02-05

## 2020-02-05 MED ORDER — FENTANYL 2500MCG IN NS 250ML (10MCG/ML) PREMIX INFUSION
0.0000 ug/h | INTRAVENOUS | Status: DC
  Administered 2020-02-05: 09:00:00 25 ug/h via INTRAVENOUS
  Filled 2020-02-05: qty 250

## 2020-02-05 MED ORDER — AMIODARONE HCL IN DEXTROSE 360-4.14 MG/200ML-% IV SOLN
60.0000 mg/h | INTRAVENOUS | Status: AC
  Administered 2020-02-05: 09:00:00 60 mg/h via INTRAVENOUS

## 2020-02-05 MED ORDER — ACETAMINOPHEN 160 MG/5ML PO SOLN
650.0000 mg | ORAL | Status: DC | PRN
  Filled 2020-02-05: qty 20.3

## 2020-02-05 MED ORDER — METOPROLOL TARTRATE 25 MG PO TABS
25.0000 mg | ORAL_TABLET | Freq: Once | ORAL | Status: AC
  Administered 2020-02-05: 04:00:00 25 mg via ORAL
  Filled 2020-02-05: qty 1

## 2020-02-05 MED ORDER — HEPARIN SODIUM (PORCINE) 1000 UNIT/ML DIALYSIS: 1000.0000 [IU] | INTRAMUSCULAR | Status: DC | PRN

## 2020-02-05 MED ORDER — ASPIRIN 325 MG PO TABS
325.0000 mg | ORAL_TABLET | Freq: Once | ORAL | Status: AC
  Administered 2020-02-05: 04:00:00 325 mg via ORAL
  Filled 2020-02-05: qty 1

## 2020-02-05 MED ORDER — HEPARIN SODIUM (PORCINE) 5000 UNIT/ML IJ SOLN
5000.0000 [IU] | Freq: Three times a day (TID) | INTRAMUSCULAR | Status: DC
  Administered 2020-02-05: 06:00:00 5000 [IU] via SUBCUTANEOUS
  Filled 2020-02-05: qty 1

## 2020-02-05 MED ORDER — AMIODARONE HCL IN DEXTROSE 360-4.14 MG/200ML-% IV SOLN: 30.0000 mg/h | INTRAVENOUS | Status: DC

## 2020-02-05 MED ORDER — SODIUM CHLORIDE 0.9 % IV SOLN
1.0000 mg/h | INTRAVENOUS | Status: DC
  Filled 2020-02-05: qty 5

## 2020-02-05 MED ORDER — ATORVASTATIN CALCIUM 20 MG PO TABS: 40.0000 mg | ORAL_TABLET | Freq: Every day | ORAL | Status: DC

## 2020-02-05 MED ORDER — HEPARIN (PORCINE) 25000 UT/250ML-% IV SOLN: 1900.0000 [IU]/h | INTRAVENOUS | Status: DC

## 2020-02-05 MED ORDER — PRISMASOL BGK 0/2.5 32-2.5 MEQ/L EC SOLN
Status: DC
  Filled 2020-02-05 (×9): qty 5000

## 2020-02-05 MED ORDER — PRISMASOL BGK 0/2.5 32-2.5 MEQ/L EC SOLN
Status: DC
  Filled 2020-02-05 (×2): qty 5000

## 2020-02-05 MED ORDER — ASPIRIN 81 MG PO CHEW: 81.0000 mg | CHEWABLE_TABLET | Freq: Every day | ORAL | Status: DC

## 2020-02-05 MED ORDER — METOPROLOL TARTRATE 25 MG PO TABS: 12.5000 mg | ORAL_TABLET | Freq: Four times a day (QID) | ORAL | Status: DC

## 2020-02-05 MED ORDER — PHENYLEPHRINE HCL-NACL 10-0.9 MG/250ML-% IV SOLN
0.0000 ug/min | INTRAVENOUS | Status: DC
  Administered 2020-02-05: 08:00:00 20 ug/min via INTRAVENOUS
  Filled 2020-02-05 (×4): qty 250

## 2020-02-05 MED ORDER — ASPIRIN EC 81 MG PO TBEC: 81.0000 mg | DELAYED_RELEASE_TABLET | Freq: Every day | ORAL | Status: DC

## 2020-02-05 MED ORDER — DOCUSATE SODIUM 50 MG/5ML PO LIQD: 100.0000 mg | Freq: Two times a day (BID) | ORAL | Status: DC | PRN

## 2020-02-06 LAB — ECHOCARDIOGRAM COMPLETE
Area-P 1/2: 4.49 cm2
Height: 66 in
S' Lateral: 4.2 cm
Weight: 4243.2 oz

## 2020-02-06 LAB — PROTEIN ELECTROPHORESIS, SERUM
A/G Ratio: 0.6 — ABNORMAL LOW (ref 0.7–1.7)
Albumin ELP: 2.1 g/dL — ABNORMAL LOW (ref 2.9–4.4)
Alpha-1-Globulin: 0.3 g/dL (ref 0.0–0.4)
Alpha-2-Globulin: 0.9 g/dL (ref 0.4–1.0)
Beta Globulin: 1.1 g/dL (ref 0.7–1.3)
Gamma Globulin: 1.3 g/dL (ref 0.4–1.8)
Globulin, Total: 3.7 g/dL (ref 2.2–3.9)
Total Protein ELP: 5.8 g/dL — ABNORMAL LOW (ref 6.0–8.5)

## 2020-02-07 LAB — CULTURE, BLOOD (ROUTINE X 2): Special Requests: ADEQUATE

## 2020-02-07 LAB — URINE CULTURE: Culture: >=100000 — AB

## 2020-02-08 NOTE — Progress Notes (Signed)
Pt was disconnected from the vent.

## 2020-02-08 NOTE — Procedures (Signed)
Intubation Procedure Note  Lavere Shinsky  [id number]  [birth year]  Date:02-23-20  Time:5:56 PM   Provider Performing:Juanluis Guastella E Orpah Melter    Procedure: Intubation (31500)  Indication(s) Respiratory Failure  Consent Risks of the procedure as well as the alternatives and risks of each were explained to the patient and/or caregiver.  Consent for the procedure was obtained and is signed in the bedside chart   Anesthesia None needed   Time Out Verified patient identification, verified procedure, site/side was marked, verified correct patient position, special equipment/implants available, medications/allergies/relevant history reviewed, required imaging and test results available.   Sterile Technique Usual hand hygeine, masks, and gloves were used   Procedure Description Patient positioned in bed supine.  Sedation given as noted above.  Patient was intubated with endotracheal tube using Glidescope.  View was Grade 1 full glottis .  Number of attempts was 1.  Colorimetric CO2 detector was consistent with tracheal placement.   Complications/Tolerance None; patient tolerated the procedure well. Chest X-ray is ordered to verify placement.   EBL Minimal   Specimen(s) None

## 2020-02-08 NOTE — Progress Notes (Signed)
  Critical Care Note Informed by RN latest hstrop up to 441 from 61 earlier yesterday morning.  She has become more tachy to 130 as well.  Currently on Levo 8 mcg and Vaso 0.03.  EKG She is altered, and a poor historian. EKG shows slight 69mm ST elevation in the inferior leads and T wave inversion in the lateral leads.    Discussed with on-call cardiologist, Dr. Stanford Breed.  He suggests it is likely type II MI in the setting of septic shock.  Given that hs trop of 441 is equal to .4 trop I, he does not recommend heparin infusion at this time.    Blood pressure 112/89, pulse (!) 141, temperature 99.8 F (37.7 C), temperature source Axillary, resp. rate (!) 26, height 5\' 6"  (1.676 m), weight 120.3 kg, SpO2 (!) 77 %.  Gen: obese in NAD, groggy HEENT:  Dry MM Chest:  Mildly coarse CVS:  Tachy, regular  - will continue to trend hs trop -- if continues to rise, consider heparin drip - give aspirin - give dose of metop 25mg  PO - place NGT for meds - ECHO pending -- last echo in 08/2019 showed EF of 30-35% - continue supportive care  Critical Care time 35 minutes.  ___________________ Samuella Cota Reinaldo Berber, MD [place] [facility]

## 2020-02-08 NOTE — Progress Notes (Signed)
   02-26-20 0125  Clinical Encounter Type  Visited With Patient;Patient and family together;Family  Visit Type Death  Referral From Chaplain  Consult/Referral To Chaplain  Chaplain stopped by to check on Pt and family and was informed Pt transitioned. Chaplain escorted a sibling to waiting area and assisted with propping up her feet. Chaplain walked to chapel to check on Pt's son and he said he was ok.

## 2020-02-08 NOTE — Progress Notes (Signed)
[facility]  ROUNDING NOTE   Subjective:  Patient with deterioration in her status this AM. Suffered cardiac arrest. Patient appears to be anuric at this time. Creatinine actually down slightly to 4.1. However acidosis persists with serum bicarbonate of 10 despite sodium bicarbonate drip. During the code patient received multiple rounds of sodium bicarbonate as well as calcium infusion. Potassium currently 5.5. Case discussed in depth with critical care physician.   Objective:  Vital signs in last 24 hours:  Temp:  [97.7 F (36.5 C)-99.8 F (37.7 C)] 99 F (37.2 C) (12/29 0400) Pulse Rate:  [101-141] 135 (12/29 0400) Resp:  [10-26] 18 (12/29 0600) BP: (66-200)/(27-178) 108/76 (12/29 0200) SpO2:  [95 %-100 %] 97 % (12/29 0400) Arterial Line BP: (92-143)/(39-91) 100/43 (12/29 0600) Weight:  [122.8 kg] 122.8 kg (12/29 0337)  Weight change: 2.506 kg Filed Weights   01/12/2020 0230 Feb 07, 2020 0337  Weight: 120.3 kg 122.8 kg    Intake/Output: I/O last 3 completed shifts: In: 3980.5 [I.V.:1469.4; IV Piggyback:2511.1] Out: 0    Intake/Output this shift:  No intake/output data recorded.  Physical Exam: General:  Critically ill-appearing  Head:  Endotracheal tube in place s  Eyes:  Anicteric  Neck:  Supple  Lungs:   Bilateral rhonchi  Heart:  S1S2 irregular  Abdomen:   Soft, nontender, mild distention noted  Extremities:  1+ peripheral edema.  Neurologic:  Obtunded  Skin:  No acute rash  Access:  IJ temporary dialysis catheter placed    Basic Metabolic Panel: Recent Labs  Lab 01/16/2020 0252 02/03/2020 1201 2020-02-07 0326  NA 138 137 137  K 5.5* 4.5 5.5*  CL 104 102 102  CO2 14* 14* 10*  GLUCOSE 265* 232* 130*  BUN 64* 60* 56*  CREATININE 4.48* 4.44* 4.17*  CALCIUM 9.3 8.5* 8.1*  MG 2.4  --   --   PHOS  --  4.4  --     Liver Function Tests: Recent Labs  Lab 01/11/2020 0252 01/27/2020 1201 Feb 07, 2020 0326  AST 41  --  71*  ALT 17  --  23  ALKPHOS  176*  --  219*  BILITOT 0.8  --  1.0  PROT 6.9  --  5.5*  ALBUMIN 1.9* 1.6* 1.4*   No results for input(s): LIPASE, AMYLASE in the last 168 hours. No results for input(s): AMMONIA in the last 168 hours.  CBC: Recent Labs  Lab 01/10/2020 0252 February 07, 2020 0326  WBC 11.7* 15.7*  NEUTROABS 9.4*  --   HGB 9.6* 9.1*  HCT 32.5* 29.4*  MCV 105.9* 104.6*  PLT 323 341    Cardiac Enzymes: No results for input(s): CKTOTAL, CKMB, CKMBINDEX, TROPONINI in the last 168 hours.  BNP: Invalid input(s): POCBNP  CBG: Recent Labs  Lab 01/23/2020 1621 01/31/2020 2027 02/06/2020 2338 2020-02-07 0344 07-Feb-2020 0744  GLUCAP 153* 114* 133* 117* 98    Microbiology: Results for orders placed or performed during the hospital encounter of 01/15/2020  Blood Culture (routine x 2)     Status: None (Preliminary result)   Collection Time: 01/15/2020  2:52 AM   Specimen: BLOOD  Result Value Ref Range Status   Specimen Description BLOOD LEFT ASSIST CONTROL  Final   Special Requests   Final    BOTTLES DRAWN AEROBIC AND ANAEROBIC Blood Culture adequate volume   Culture  Setup Time   Final    Organism ID to follow GRAM POSITIVE COCCI ANAEROBIC BOTTLE ONLY GRAM NEGATIVE RODS AEROBIC BOTTLE ONLY CRITICAL RESULT CALLED TO, READ  BACK BY AND VERIFIED WITH: BEN BEERS 01/31/2020 1725 KLW Performed at [facility], [address] [place], [place] [address]    Culture GRAM POSITIVE COCCI GRAM NEGATIVE RODS   Final   Report Status PENDING  Incomplete  Resp Panel by RT-PCR (Flu A&B, Covid)     Status: None   Collection Time: 01/25/2020  2:52 AM   Specimen: Nasopharyngeal(NP) swabs in vial transport medium  Result Value Ref Range Status   SARS Coronavirus 2 by RT PCR NEGATIVE NEGATIVE Final    Comment: (NOTE) SARS-CoV-2 target nucleic acids are NOT DETECTED.  The SARS-CoV-2 RNA is generally detectable in upper respiratory specimens during the acute phase of infection. The lowest concentration of SARS-CoV-2  viral copies this assay can detect is 138 copies/mL. A negative result does not preclude SARS-Cov-2 infection and should not be used as the sole basis for treatment or other patient management decisions. A negative result may occur with  improper specimen collection/handling, submission of specimen other than nasopharyngeal swab, presence of viral mutation(s) within the areas targeted by this assay, and inadequate number of viral copies(<138 copies/mL). A negative result must be combined with clinical observations, patient history, and epidemiological information. The expected result is Negative.  Fact Sheet for Patients:  EntrepreneurPulse.com.au  Fact Sheet for Healthcare Providers:  IncredibleEmployment.be  This test is no t yet approved or cleared by the [place] FDA and  has been authorized for detection and/or diagnosis of SARS-CoV-2 by FDA under an Emergency Use Authorization (EUA). This EUA will remain  in effect (meaning this test can be used) for the duration of the COVID-19 declaration under Section 564(b)(1) of the Act, 21 U.S.C.section 360bbb-3(b)(1), unless the authorization is terminated  or revoked sooner.       Influenza A by PCR NEGATIVE NEGATIVE Final   Influenza B by PCR NEGATIVE NEGATIVE Final    Comment: (NOTE) The Xpert Xpress SARS-CoV-2/FLU/RSV plus assay is intended as an aid in the diagnosis of influenza from Nasopharyngeal swab specimens and should not be used as a sole basis for treatment. Nasal washings and aspirates are unacceptable for Xpert Xpress SARS-CoV-2/FLU/RSV testing.  Fact Sheet for Patients: EntrepreneurPulse.com.au  Fact Sheet for Healthcare Providers: IncredibleEmployment.be  This test is not yet approved or cleared by the [place] FDA and has been authorized for detection and/or diagnosis of SARS-CoV-2 by FDA under an Emergency Use Authorization  (EUA). This EUA will remain in effect (meaning this test can be used) for the duration of the COVID-19 declaration under Section 564(b)(1) of the Act, 21 U.S.C. section 360bbb-3(b)(1), unless the authorization is terminated or revoked.  Performed at [facility], [address] [place], [place] [address]   Blood Culture ID Panel (Reflexed)     Status: Abnormal   Collection Time: 01/25/2020  2:52 AM  Result Value Ref Range Status   Enterococcus faecalis NOT DETECTED NOT DETECTED Final   Enterococcus Faecium NOT DETECTED NOT DETECTED Final   Listeria monocytogenes NOT DETECTED NOT DETECTED Final   Staphylococcus species NOT DETECTED NOT DETECTED Final   Staphylococcus aureus (BCID) NOT DETECTED NOT DETECTED Final   Staphylococcus epidermidis NOT DETECTED NOT DETECTED Final   Staphylococcus lugdunensis NOT DETECTED NOT DETECTED Final   Streptococcus species NOT DETECTED NOT DETECTED Final   Streptococcus agalactiae NOT DETECTED NOT DETECTED Final   Streptococcus pneumoniae NOT DETECTED NOT DETECTED Final   Streptococcus pyogenes NOT DETECTED NOT DETECTED Final   A.calcoaceticus-baumannii NOT DETECTED NOT DETECTED Final  Bacteroides fragilis NOT DETECTED NOT DETECTED Final   Enterobacterales DETECTED (A) NOT DETECTED Final    Comment: Enterobacterales represent a large order of gram negative bacteria, not a single organism. Refer to culture for further identification. CRITICAL RESULT CALLED TO, READ BACK BY AND VERIFIED WITH: BEN BEERS 01/10/2020 1725 KLW    Enterobacter cloacae complex NOT DETECTED NOT DETECTED Final   Escherichia coli NOT DETECTED NOT DETECTED Final   Klebsiella aerogenes NOT DETECTED NOT DETECTED Final   Klebsiella oxytoca NOT DETECTED NOT DETECTED Final   Klebsiella pneumoniae NOT DETECTED NOT DETECTED Final   Proteus species NOT DETECTED NOT DETECTED Final   Salmonella species NOT DETECTED NOT DETECTED Final   Serratia marcescens NOT DETECTED NOT  DETECTED Final   Haemophilus influenzae NOT DETECTED NOT DETECTED Final   Neisseria meningitidis NOT DETECTED NOT DETECTED Final   Pseudomonas aeruginosa NOT DETECTED NOT DETECTED Final   Stenotrophomonas maltophilia NOT DETECTED NOT DETECTED Final   Candida albicans NOT DETECTED NOT DETECTED Final   Candida auris NOT DETECTED NOT DETECTED Final   Candida glabrata NOT DETECTED NOT DETECTED Final   Candida krusei NOT DETECTED NOT DETECTED Final   Candida parapsilosis NOT DETECTED NOT DETECTED Final   Candida tropicalis NOT DETECTED NOT DETECTED Final   Cryptococcus neoformans/gattii NOT DETECTED NOT DETECTED Final   CTX-M ESBL NOT DETECTED NOT DETECTED Final   Carbapenem resistance IMP NOT DETECTED NOT DETECTED Final   Carbapenem resistance KPC NOT DETECTED NOT DETECTED Final   Carbapenem resistance NDM NOT DETECTED NOT DETECTED Final   Carbapenem resist OXA 48 LIKE NOT DETECTED NOT DETECTED Final   Carbapenem resistance VIM NOT DETECTED NOT DETECTED Final    Comment: Performed at [facility], [address] [place], [place] [address]  Blood Culture (routine x 2)     Status: None (Preliminary result)   Collection Time: 02/06/2020  2:53 AM   Specimen: BLOOD  Result Value Ref Range Status   Specimen Description BLOOD BLOOD LEFT WRIST  Final   Special Requests   Final    BOTTLES DRAWN AEROBIC AND ANAEROBIC Blood Culture results may not be optimal due to an inadequate volume of blood received in culture bottles   Culture  Setup Time   Final    GRAM POSITIVE COCCI AEROBIC BOTTLE ONLY Organism ID to follow CRITICAL RESULT CALLED TO, READ BACK BY AND VERIFIED WITH: BEN BEERS 01/22/2020 1725 KLW Performed at [facility], [address] [place], [place] [address]    Culture GRAM POSITIVE COCCI  Final   Report Status PENDING  Incomplete  Blood Culture ID Panel (Reflexed)     Status: Abnormal   Collection Time: 01/20/2020  2:53 AM  Result Value Ref Range Status    Enterococcus faecalis DETECTED (A) NOT DETECTED Final    Comment: CRITICAL RESULT CALLED TO, READ BACK BY AND VERIFIED WITH: BEN BEERS 01/19/2020 1725 KLW    Enterococcus Faecium NOT DETECTED NOT DETECTED Final   Listeria monocytogenes NOT DETECTED NOT DETECTED Final   Staphylococcus species NOT DETECTED NOT DETECTED Final   Staphylococcus aureus (BCID) NOT DETECTED NOT DETECTED Final   Staphylococcus epidermidis NOT DETECTED NOT DETECTED Final   Staphylococcus lugdunensis NOT DETECTED NOT DETECTED Final   Streptococcus species NOT DETECTED NOT DETECTED Final   Streptococcus agalactiae NOT DETECTED NOT DETECTED Final   Streptococcus pneumoniae NOT DETECTED NOT DETECTED Final   Streptococcus pyogenes NOT DETECTED NOT DETECTED Final   A.calcoaceticus-baumannii NOT DETECTED NOT DETECTED Final  Bacteroides fragilis NOT DETECTED NOT DETECTED Final   Enterobacterales NOT DETECTED NOT DETECTED Final   Enterobacter cloacae complex NOT DETECTED NOT DETECTED Final   Escherichia coli NOT DETECTED NOT DETECTED Final   Klebsiella aerogenes NOT DETECTED NOT DETECTED Final   Klebsiella oxytoca NOT DETECTED NOT DETECTED Final   Klebsiella pneumoniae NOT DETECTED NOT DETECTED Final   Proteus species NOT DETECTED NOT DETECTED Final   Salmonella species NOT DETECTED NOT DETECTED Final   Serratia marcescens NOT DETECTED NOT DETECTED Final   Haemophilus influenzae NOT DETECTED NOT DETECTED Final   Neisseria meningitidis NOT DETECTED NOT DETECTED Final   Pseudomonas aeruginosa NOT DETECTED NOT DETECTED Final   Stenotrophomonas maltophilia NOT DETECTED NOT DETECTED Final   Candida albicans NOT DETECTED NOT DETECTED Final   Candida auris NOT DETECTED NOT DETECTED Final   Candida glabrata NOT DETECTED NOT DETECTED Final   Candida krusei NOT DETECTED NOT DETECTED Final   Candida parapsilosis NOT DETECTED NOT DETECTED Final   Candida tropicalis NOT DETECTED NOT DETECTED Final   Cryptococcus neoformans/gattii  NOT DETECTED NOT DETECTED Final   Vancomycin resistance NOT DETECTED NOT DETECTED Final    Comment: Performed at [facility], [address] [place], [place] [address]    Coagulation Studies: Recent Labs    02/03/2020 0252  LABPROT 13.6  INR 1.1    Urinalysis: Recent Labs    02/02/2020 0252  COLORURINE YELLOW*  LABSPEC 1.015  PHURINE 5.0  GLUCOSEU NEGATIVE  HGBUR LARGE*  BILIRUBINUR NEGATIVE  KETONESUR NEGATIVE  PROTEINUR 100*  NITRITE NEGATIVE  LEUKOCYTESUR MODERATE*      Imaging: CT ABDOMEN PELVIS WO CONTRAST  Result Date: 01/11/2020 CLINICAL DATA:  Sepsis EXAM: CT ABDOMEN AND PELVIS WITHOUT CONTRAST TECHNIQUE: Multidetector CT imaging of the abdomen and pelvis was performed following the standard protocol without IV contrast. COMPARISON:  Ultrasound studies same day. CT 01/17/2020, 2019/08/15. FINDINGS: Lower chest: Lung bases are currently clear. There is some chronic pleural thickening on the left with somewhat nodular configuration. This could be ordinary pleural scarring, but pleural masses are not excluded. Hepatobiliary: Mild fatty change of the liver. Numerous gallstones dependent in the gallbladder. Gallbladder sludge. No CT evidence of cholecystitis or obstruction. Pancreas: Normal Spleen: Normal Adrenals/Urinary Tract: Adrenal glands are normal. Kidneys are normal. Bladder is normal. Stomach/Bowel: Stomach and small intestine are normal. Moderate amount of fecal matter in the colon. There could be a degree of fecal impaction. Question if there could be mild colitis of the distal colon related to impaction. Vascular/Lymphatic: Aortic atherosclerosis. No aneurysm. IVC is normal. No retroperitoneal adenopathy. Reproductive: Normal.  No pelvic mass. Other: Small amount of free fluid without loculation/abscess. No free air. No hernia. Musculoskeletal: Ordinary spinal degenerative changes. Chronic pars defects at L5. IMPRESSION: 1. Moderate amount of fecal matter in  the colon. There could be a degree of fecal impaction. Question mild colitis of the distal colon, possibly secondary to impaction. 2. Small amount of free fluid in the pelvis without loculation/abscess. 3. Chronic pleural thickening on the left with somewhat nodular configuration. This could be ordinary pleural scarring, but pleural masses are not excluded. Suspicion is low but consider follow-up the patient has a history of malignancy. 4. Aortic atherosclerosis. 5. Mild fatty change of the liver. Multiple gallstones without CT evidence of cholecystitis or obstruction. 6. Chronic pars defects at L5. Aortic Atherosclerosis (ICD10-I70.0). Electronically Signed   By: Nelson Chimes M.D.   On: 01/31/2020 14:50   DG Abd 1 View  Result  Date: 2020-03-02 CLINICAL DATA:  NG tube placement EXAM: ABDOMEN - 1 VIEW COMPARISON:  None. FINDINGS: Nasogastric tube tip and side port are below the diaphragm, projecting within the stomach. IMPRESSION: Nasogastric tube tip and side port in the stomach. Electronically Signed   By: Ulyses Jarred M.D.   On: 03/02/2020 03:25   US RENAL  Result Date: 02/03/2020 CLINICAL DATA:  AKI EXAM: RENAL / URINARY TRACT ULTRASOUND COMPLETE COMPARISON:  01/17/2020 and prior. FINDINGS: Right Kidney: Renal measurements: 9.3 x 4.4 x 5.2 cm = volume: 110 mL. Increased parenchymal echogenicity. No mass or hydronephrosis visualized. Left Kidney: Renal measurements: 10.2 x 3.9 x 4.1 cm = volume: 85 mL. Increased parenchymal echogenicity. No mass or hydronephrosis visualized. Bladder: Appears normal for degree of bladder distention. Other: None. IMPRESSION: Echogenic renal parenchyma may reflect sequela of medical renal disease. Electronically Signed   By: Primitivo Gauze M.D.   On: 02/01/2020 12:43   DG Chest Port 1 View  Result Date: 01/16/2020 CLINICAL DATA:  Central line placement EXAM: PORTABLE CHEST 1 VIEW COMPARISON:  01/11/2020 FINDINGS: Single frontal view of the chest demonstrates  right internal jugular catheter tip overlying superior vena cava. Cardiac silhouette is stable. No airspace disease, effusion, or pneumothorax. IMPRESSION: 1. No complication after right internal jugular catheter placement. Electronically Signed   By: Randa Ngo M.D.   On: 01/12/2020 18:41   DG Chest Port 1 View  Result Date: 02/02/2020 CLINICAL DATA:  Encephalopathy EXAM: PORTABLE CHEST 1 VIEW COMPARISON:  01/25/2020 FINDINGS: The heart size and mediastinal contours are within normal limits. Both lungs are clear. The visualized skeletal structures are unremarkable. IMPRESSION: No active disease. Electronically Signed   By: Ulyses Jarred M.D.   On: 01/12/2020 03:50   US Abdomen Limited RUQ (LIVER/GB)  Result Date: 01/25/2020 CLINICAL DATA:  Elevated LFTs. EXAM: ULTRASOUND ABDOMEN LIMITED RIGHT UPPER QUADRANT COMPARISON:  01/17/2020 and prior. FINDINGS: Gallbladder: No wall thickening visualized. Intraluminal calculi measuring up to 5 mm. 2.0 cm focus of tumefactive sludge. Sonographic Percell Miller sign could not be obtained secondary to patient disposition. Common bile duct: Diameter: 6.8 mm Liver: No focal lesion identified. Increased parenchymal echogenicity. Portal vein is patent on color Doppler imaging with normal direction of blood flow towards the liver. Other: None. IMPRESSION: Gallbladder sludge and calculi.  No evidence of cholecystitis. Hepatic steatosis. Electronically Signed   By: Primitivo Gauze M.D.   On: 01/23/2020 12:38     Medications:   . sodium chloride    . sodium chloride    . amiodarone    . amiodarone    . fentaNYL infusion INTRAVENOUS    . norepinephrine (LEVOPHED) Adult infusion 16 mcg/min (03/02/2020 0536)  . phenylephrine (NEO-SYNEPHRINE) Adult infusion 20 mcg/min (03-02-2020 0755)  . piperacillin-tazobactam (ZOSYN)  IV 2.25 g (2020-03-02 0547)  .  sodium bicarbonate (isotonic) infusion in sterile water Stopped (01/11/2020 2357)  . vasopressin 0.03 Units/min (03/02/2020  0352)   . aspirin  81 mg Per Tube Daily  . Chlorhexidine Gluconate Cloth  6 each Topical Daily  . collagenase   Topical Daily  . Gerhardt's butt cream   Topical BID  . heparin  5,000 Units Subcutaneous Q8H  . insulin aspart  0-15 Units Subcutaneous Q4H  . pantoprazole (PROTONIX) IV  40 mg Intravenous Q24H  . patiromer  16.8 g Oral Daily   acetaminophen (TYLENOL) oral liquid 160 mg/5 mL, docusate, heparin, ondansetron (ZOFRAN) IV, polyethylene glycol  Assessment/ Plan:  70 y.o. female with a PMHx of multiple  alcohol, COPD, systolic and diastolic heart failure, morbid obesity, diabetes mellitus type 2, hypertension, anemia of chronic kidney disease, lower extremity cellulitis, chronic kidney disease stage IV baseline creatinine 2.87, osteoarthritis, who was admitted to [facility] on 01/28/2020 for evaluation of septic shock in the setting of UTI as well as severe acute kidney injury and anion gap metabolic acidosis.  1.  Acute kidney injury secondary to ATN/chronic kidney disease stage IV baseline creatinine 2.86 with an EGFR of 17/metabolic acidosis.  Creatinine slightly improved this a.m. to 4.1 however patient appears to be anuric.  Temporary dialysis catheter has been placed.  Hopefully she can stabilize a bit post cardiac arrest and we can start the patient on CRRT.  Patient has been given multiple administrations of sodium bicarbonate as well as calcium during cardiac arrest.  2.  Hyperkalemia.  Serum potassium 5.5.  Hopefully can be treated with CRRT if her condition stabilizes.  3.  Septic shock/hypotension.  Maintain on multiple pressors to maintain a map of 65 or greater.  4.  Overall prognosis quite guarded.   LOS: 1 Minor Iden 01-12-20228:50 AM

## 2020-02-08 NOTE — Plan of Care (Signed)
Plan of Care   Spoke with family and they know patient would not have good quality of life after this event. They do not wish to continue with aggressive care. They wish to make patient comfortable. Will make patient comfort care. Family agreeable with this.    Newell Coral DO Internal Medicine/Pediatrics Pulmonary and Critical Care Fellow PGY-7

## 2020-02-08 NOTE — Progress Notes (Signed)
Orders for pt to be switched to neo instead of levo. Pressers changed and pt went asystole. Compressions started immediately and code called. Pt given epi and started on epi gtt. Pt started on amio gtt and given multiple bolus's of amio. Pt also received sodium bicarb, calcium, amp of dextrose, and insulin. Rosc achieved. Family notified to come in. Family has decided for pt to be comfort care once other family members arrive, they did give the ok for pts code status to be DNR. Family is at the bedside and gtts are still in place.

## 2020-02-08 NOTE — Progress Notes (Signed)
NAME:  Gabrielle [name], MRN:  [id number], DOB:  1950/11/08, LOS: 1 ADMISSION DATE:  01/08/2020, CONSULTATION DATE:  01/12/2020 REFERRING MD:  Dr. Karma Greaser, CHIEF COMPLAINT:  Altered Mental Status   Brief History:  70 y.o. Female admitted with Septic Shock in the setting of UTI, along with AKI  and severe anion gap metabolic acidosis.  History of Present Illness:  Gabrielle [name] is a 70 y.o. Female with a past medical history as listed below who presents to [facility] ED on 01/10/2020 from [facility] due to Altered Mental Status.  Pt remains altered at this time, therefore history is obtained from ED and nursing notes.  Per notes, her nursing facility reports she has not been exhibiting her normal behavior for about 3 to 4 days, which has worsened over time.  Lab work and Urinalysis were performed at the facility yesterday which reportedly has bicarb of 12 and grossly positive UA.  Reportedly no treatment was initiated.  Last night she had decreased level of consciousness prompting sending her to the ED for further evaluation.  ED Course: Upon presentation to the ED vital signs included temperature 95.3 rectally, RR 16, HR 121, BP 114/95, O2 saturations 100% on 2L nasal cannula. Workup revealed WBC 11.7 w/ neutrophilia and mild left shift, procalcitonin 5.38, lactic acid 9.2, potassium 5.5, bicarb 14, anion gap 20, BUN 64, Creatinine 4.48, alkaline phosphatase 176, and glucose 265. Venous blood gas with pH 7.11, pCO2 35, pO2 31, and Bicarb 11.1.  Chest x-ray without acute disease, and Urinalysis is consistent with UTI.  Her COVID-19 PCR and Influenza PCR are both negative.   She met sepsis criteria, therefore she received IV fluid resuscitation and broad spectrum antibiotics (Cefepime, Flagyl, and Vancomycin).  While in the ED she became hypotensive refractory to IV fluids, therefore she was placed on Levophed drip.  PCCM is asked to admit the patient to ICU for further workup and treatment of Septic  Shock in the setting of UTI, along with AKI and severe anion gap metabolic acidosis requiring Bicarb drip.  Nephrology has been consulted.   Of note, she was recently admitted to [facility] from 01/25/20 to 01/26/20 for bleeding from her chronic sacral decubitus ulcers.   Interval history 01/10/2020-Since admission patient having sustained hypotension and elevated lactic acid of 9.6.  Patient obtain CT abdomen.  No acute findings some possible colitis.  Culture still pending  02-27-2020-patient early this a.m. had a code event patient went into PEA arrest ROSC obtained after about 15 minutes of resuscitation.  Throughout this time patient given multiple ampules of bicarb multiple doses of calcium additionally patient given magnesium as well.  After ROSC obtained intubation was performed to stabilize the airway.  Family was called in.  They saw the patient's condition and noted that she had been deteriorating for years.  Her quality of life was already compromised.  Based off of this they knew that she would not want to continue knowing that she would at best come back to a quality of life similar or worse to what she had previously.  Family elected to make patient comfort care.  Patient was given narcotics and benzodiazepines for anxiety and air hunger.  Patient passed shortly thereafter.   Past Medical History:  OSA COPD on 2L Supplemental O2 Combined Systolic & Diastolic CHF Morbid Obesity Diabetes Mellitus Hypertension CKD Anemia Cellulitis of Left lower extremity Osteoarthritis  Significant Hospital Events:  12/28: Admit to ICU  Consults:  PCCM Nephrology  Procedures:  12/28: Left  femoral CVC placed 12/28: Left femoral arterial line placed  Significant Diagnostic Tests:  12/28: CXR>>No active disease. 12/28: RUQ Abdominal US>>  Micro Data:  12/28: SARS-CoV-2 PCR>>negative 12/28: Influenza PCR>>negative 12/28: Blood culture x2>> 12/28: Urine >>  Antimicrobials:  Cefepime x1  dose 12/28 Flagyl x1 dose 12/28 Vancomycin x1 dose 12/28 Ceftriaxone 12/28>>  Interim History / Subjective:  Pt is awake but altered, currently protecting her airway Able to nod to yes/no questions Levophed gtt initiated due to hypotension On 2L [place] Does complain of abdominal pain upon palpitation, but no guarding or rebound tenderness, abdomen is soft  Objective   Blood pressure 108/76, pulse 81, temperature 99 F (37.2 C), temperature source Axillary, resp. rate (!) 0, height 5' 6" (1.676 m), weight 122.8 kg, SpO2 (!) 63 %.    Vent Mode: PRVC FiO2 (%):  [80 %] 80 % Set Rate:  [20 bmp] 20 bmp Vt Set:  [500 mL] 500 mL PEEP:  [5 cmH20] 5 cmH20   Intake/Output Summary (Last 24 hours) at 02/17/2020 1757 Last data filed at 2020-02-17 0000 Gross per 24 hour  Intake 1435 ml  Output 0 ml  Net 1435 ml   Filed Weights   01/31/2020 0230 Feb 17, 2020 0337  Weight: 120.3 kg 122.8 kg    Examination: General: Acute on chronically ill appearing female, laying in bed, on 2L [place], in NAD HENT: Atraumatic, normocephalic, neck supple, no JVD Lungs: Clear to auscultation bilaterally, no rales or wheezing, even, nonlabored Cardiovascular: Tachycardia, regular rhythm, s1s2, no M/R/G Abdomen: Obese, soft, generalized tenderness, no guarding or rebound tenderness, BS+ x4 Extremities: No gross deformities, no edema Neuro: Awake and alert, oriented only to self, follows commands, no focal deficits GU: Grossly purulent urine.  Chronic pressure wounds around her anus and on sacrum which are bleeding Skin: Chronic sacral decubs and pressure wounds around the anus  CT abdomen from 01/27/2020 showing the following:  Lower chest: Lung bases are currently clear. There is some chronic pleural thickening on the left with somewhat nodular configuration. This could be ordinary pleural scarring, but pleural masses are not excluded.  Hepatobiliary: Mild fatty change of the liver. Numerous gallstones dependent  in the gallbladder. Gallbladder sludge. No CT evidence of cholecystitis or obstruction.  Pancreas: Normal  Spleen: Normal  Adrenals/Urinary Tract: Adrenal glands are normal. Kidneys are normal. Bladder is normal.  Stomach/Bowel: Stomach and small intestine are normal. Moderate amount of fecal matter in the colon. There could be a degree of fecal impaction. Question if there could be mild colitis of the distal colon related to impaction.  Vascular/Lymphatic: Aortic atherosclerosis. No aneurysm. IVC is normal. No retroperitoneal adenopathy.  Reproductive: Normal.  No pelvic mass.  Other: Small amount of free fluid without loculation/abscess. No free air. No hernia.  Musculoskeletal: Ordinary spinal degenerative changes. Chronic pars defects at L5.  IMPRESSION: 1. Moderate amount of fecal matter in the colon. There could be a degree of fecal impaction. Question mild colitis of the distal colon, possibly secondary to impaction. 2. Small amount of free fluid in the pelvis without loculation/abscess. 3. Chronic pleural thickening on the left with somewhat nodular configuration. This could be ordinary pleural scarring, but pleural masses are not excluded. Suspicion is low but consider follow-up the patient has a history of malignancy. 4. Aortic atherosclerosis. 5. Mild fatty change of the liver. Multiple gallstones without CT evidence of cholecystitis or obstruction. 6. Chronic pars defects at L5.      Resolved Hospital Problem list   N/A  Assessment & Plan:   Septic Shock Chronic combined Systolic & Diastolic CHF without acute exacerbation Hx: HTN -Continuous cardiac monitoring -Maintain MAP >65 -Received IV Fluid resuscitation in ED -Acidosis from septic shock continue to worsen as well as hyperkalemia eventually patient arrested from metabolic derangements.   Severe Sepsis in the setting of UTI -Monitor fever curve -Trend WBC's &  Procalcitonin -Follow cultures as above -Start broad spectrum to include vanc and zosyn   AKI on CKD Anion Gap Metabolic Acidosis in the setting of lactic acidosis Mild Hyperkalemia -Monitor I&O's / urinary output -Follow BMP -Ensure adequate renal perfusion -Avoid nephrotoxic agents as able -Replace electrolytes as indicated -Bicarb gtt -Given 10 units insulin, 1 amp bicarb, and [place] for Hyperkalemia -Vas-Cath placed patient family has elected not to put patient through dialysis and attempts to prolong her life   Elevated Alkaline Phosphatase Abdominal pain -NPO for now -Trend LFT's -Obtain RUQ abdominal US -Consider CT Abdomen/Pelvis if worsening lactic acidosis   Chronic Hypoxic Respiratory Failure (on 2L supplemental O2 at baseline) COPD without acute exacerbation -Supplemental O2 as needed to maintain O2 sats 88 to 94% -Currently maintaining her airway, but high risk for intubation due to sepsis and severe metabolic derangements  -Follow intermittent CXR & ABG as needed -Prn Bronchodilators   Anemia of Chronic Disease -Monitor for S/Sx of bleeding -Trend CBC -Heparin SQ for VTE Prophylaxis  -Transfuse for Hgb <7   Diabetes Mellitus -CBG's -Sliding scale insulin -Follow ICU Hypo/hyperglycemia protocol -Check Hgb A1c   Chronic Sacral Decubitus Ulcerations and Chronic wounds around anus -Consult Wound care, appreciate input        Pt is critically ill, prognosis is guarded.  She is at high risk for cardiac arrest and death.     Best practice (evaluated daily)  Diet: NPO for now until mentation improved Pain/Anxiety/Delirium protocol (if indicated): N/A VAP protocol (if indicated): N/A DVT prophylaxis: Heparin SQ GI prophylaxis: Protonix Glucose control: SSI Mobility: As tolerated Disposition:ICU  Goals of Care:  Last date of multidisciplinary goals of care discussion: N/A Family and staff present: Updated Pt's sister Bethena Roys at bedside, pt's  son is commuting from [place] Summary of discussion: Septic shock due to UTI requiring Vasopressors.  AKI that may need dialysis in the near future. Follow up goals of care discussion due: February 28, 2020 Code Status: Full code  Labs   CBC: Recent Labs  Lab 01/08/2020 0252 February 28, 2020 0326 2020-02-28 0920  WBC 11.7* 15.7* 15.1*  NEUTROABS 9.4*  --  10.7*  HGB 9.6* 9.1* 7.3*  HCT 32.5* 29.4* 25.4*  MCV 105.9* 104.6* 112.4*  PLT 323 341 161    Basic Metabolic Panel: Recent Labs  Lab 02/03/2020 0252 01/30/2020 1201 2020-02-28 0326 2020/02/28 0920  NA 138 137 137 137  K 5.5* 4.5 5.5* 5.2*  CL 104 102 102 99  CO2 14* 14* 10* 9*  GLUCOSE 265* 232* 130* 270*  BUN 64* 60* 56* 59*  CREATININE 4.48* 4.44* 4.17* 4.38*  CALCIUM 9.3 8.5* 8.1* 10.6*  MG 2.4  --   --   --   PHOS  --  4.4  --   --    GFR: Estimated Creatinine Clearance: 16.7 mL/min (A) (by C-G formula based on SCr of 4.38 mg/dL (H)). Recent Labs  Lab 01/11/2020 0252 01/21/2020 0410 01/12/2020 0642 01/14/2020 1201 01/11/2020 2219 02/28/20 0326 02-28-20 0920  PROCALCITON 5.38  --   --   --   --  6.93 6.25  WBC 11.7*  --   --   --   --  15.7* 15.1*  LATICACIDVEN 9.2*   < > 10.5* 9.6* 7.9*  --  >11.0*   < > = values in this interval not displayed.    Liver Function Tests: Recent Labs  Lab 01/14/2020 0252 01/09/2020 1201 2020-03-05 0326 05-Mar-2020 0920  AST 41  --  71* 83*  ALT 17  --  23 21  ALKPHOS 176*  --  219* 204*  BILITOT 0.8  --  1.0 1.0  PROT 6.9  --  5.5* 4.3*  ALBUMIN 1.9* 1.6* 1.4* 1.1*   No results for input(s): LIPASE, AMYLASE in the last 168 hours. No results for input(s): AMMONIA in the last 168 hours.  ABG    Component Value Date/Time   PHART 7.45 05/03/2016 1110   PCO2ART 70 (HH) 05/03/2016 1110   PO2ART 69 (L) 05/03/2016 1110   HCO3 11.1 (L) 02/01/2020 0252   ACIDBASEDEF 17.3 (H) 01/29/2020 0252   O2SAT 34.7 01/29/2020 0252     Coagulation Profile: Recent Labs  Lab 01/08/2020 0252 2020/03/05 0920  INR 1.1  1.6*    Cardiac Enzymes: No results for input(s): CKTOTAL, CKMB, CKMBINDEX, TROPONINI in the last 168 hours.  HbA1C: Hgb A1c MFr Bld  Date/Time Value Ref Range Status  01/20/2020 02:52 AM 5.6 4.8 - 5.6 % Final    Comment:    (NOTE) Pre diabetes:          5.7%-6.4%  Diabetes:              >6.4%  Glycemic control for   <7.0% adults with diabetes   01/17/2020 04:32 AM 5.7 (H) 4.8 - 5.6 % Final    Comment:    (NOTE) Pre diabetes:          5.7%-6.4%  Diabetes:              >6.4%  Glycemic control for   <7.0% adults with diabetes     CBG: Recent Labs  Lab 01/28/2020 1621 01/18/2020 2027 01/30/2020 2338 05-Mar-2020 0344 03/05/2020 0744  GLUCAP 153* 114* 133* 117* 98    Review of Systems:   Unable to assess due to AMS and critical illness  Past Medical History:  She,  has a past medical history of Allergy, Anemia, Arthritis, Cellulitis and abscess of left leg, Chronic kidney disease, Chronic venous stasis dermatitis of both lower extremities, Diabetes mellitus without complication ([place]), Hypertension, Morbid obesity ([place]), Osteoarthritis, Oxygen dependent, Skin cancer, and Sleep apnea.   Surgical History:   Past Surgical History:  Procedure Laterality Date  . NO PAST SURGERIES    . none       Social History:   reports that she has never smoked. She has never used smokeless tobacco. She reports that she does not drink alcohol and does not use drugs.   Family History:  Her family history includes CAD in her father and mother; Diabetes Mellitus II in her father; Hiatal hernia in her mother.   Allergies Allergies  Allergen Reactions  . Lasix [Furosemide]     Numbness and tingling of the face   . Ultram [Tramadol] Other (See Comments)    Pt reports, "makes my head feel funny."  . Erythromycin Rash  . Plasticized Base [Plastibase] Rash  . Tetracyclines & Related Rash     Home Medications  Prior to Admission medications   Medication Sig Start Date End Date Taking?  Authorizing Provider  acetaminophen (TYLENOL) 500 MG tablet Take 500 mg by mouth 3 (three) times daily.    [provider]  benztropine (COGENTIN) 1 MG tablet Take 1 mg by mouth 2 (two) times daily.  04/26/17   [provider]  bisacodyl (DULCOLAX) 5 MG EC tablet Take 10 mg by mouth daily as needed for moderate constipation or severe constipation.    [provider]  busPIRone (BUSPAR) 10 MG tablet Take 10 mg by mouth 3 (three) times daily.     [provider]  clobetasol cream (TEMOVATE) 5.05 % Apply 1 application topically in the morning and at bedtime. (apply to scalp, legs and arms)    [provider]  clopidogrel (PLAVIX) 75 MG tablet Take 1 tablet (75 mg total) by mouth daily. 08/15/19 08/14/20  Max Sane, MD  docusate sodium (COLACE) 100 MG capsule Take 100 mg by mouth daily.     [provider]  empagliflozin (JARDIANCE) 25 MG TABS tablet Take 1 tablet (25 mg total) by mouth daily before breakfast. 08/15/19   Max Sane, MD  ethacrynic acid (EDECRIN) 25 MG tablet Take 3 tablets (75 mg total) by mouth 2 (two) times daily. 05/04/16   Vaughan Basta, MD  Exenatide ER (BYDUREON BCISE) 2 MG/0.85ML AUIJ Inject 2 mg into the skin every Wednesday.    [provider]  fluticasone (FLONASE) 50 MCG/ACT nasal spray Place 1 spray into both nostrils every 12 (twelve) hours as needed for allergies.     [provider]  HUMALOG 100 UNIT/ML cartridge Inject 5 Units into the skin See admin instructions. Inject 5u under the skin three times daily with meals with sliding scale    [provider]  HYDROcodone-acetaminophen (NORCO/VICODIN) 5-325 MG tablet Take 1 tablet by mouth every 6 (six) hours as needed for up to 10 doses for moderate pain or severe pain. 01/26/20   Fritzi Mandes, MD  Infant Care Products [facility]) OINT Apply 1 application topically daily.    [provider]  insulin glargine (LANTUS) 100 UNIT/ML  injection Inject 0.4 mLs (40 Units total) into the skin daily. 01/26/20   Fritzi Mandes, MD  ipratropium-albuterol (DUONEB) 0.5-2.5 (3) MG/3ML SOLN Take 3 mLs by nebulization every 4 (four) hours as needed (congestion).    [provider]  nabumetone (RELAFEN) 500 MG tablet Take 500 mg by mouth daily.     [provider]  nitroGLYCERIN (NITROSTAT) 0.4 MG SL tablet Place 0.4 mg under the tongue every 5 (five) minutes as needed for chest pain.    [provider]  nystatin cream (MYCOSTATIN) Apply 1 application topically 2 (two) times daily. (apply to perennial area)    [provider]  omeprazole (PRILOSEC) 20 MG capsule Take 20 mg by mouth every other day.    [provider]  polyethylene glycol (MIRALAX / GLYCOLAX) packet Take 17 g by mouth daily.    [provider]  sodium chloride (OCEAN) 0.65 % SOLN nasal spray Place 2 sprays into both nostrils every 8 (eight) hours as needed for congestion.    [provider]  vitamin B-12 (CYANOCOBALAMIN) 1000 MCG tablet Take 1,000 mcg by mouth every Monday, Wednesday, and Friday.    [provider]     Critical care time: 35 minutes     Newell Coral DO Internal Medicine/Pediatrics Pulmonary and Critical Care Fellow PGY-7

## 2020-02-08 NOTE — Death Summary Note (Signed)
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 Phosphatase Abdominal pain Chronic Hypoxic Respiratory Failure (on 2L supplemental O2 at baseline) COPD without acute exacerbation Anemia of Chronic Disease 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  Diagnoses  Preliminary cause of death:   Septic Shock Acute Kidney Injury Anion Gap Metabolic Acidosis Secondary Diagnoses (including complications and co-morbidities):  Active Problems:   Sepsis ([place])   Pressure injury of skin 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 Female with a past medical history as listed below who presents to [facility] ED on 2020-02-22 from [facility] due to Altered Mental Status.  Pt remains altered at this time, therefore history is obtained from ED and nursing notes.  Per notes, her nursing facility reports she has not been exhibiting her normal behavior for about 3 to 4 days, which has worsened over time.  Lab work and Urinalysis were performed at the facility yesterday which reportedly has  bicarb of 12 and grossly positive UA.  Reportedly no treatment was initiated.  Last night she had decreased level of consciousness prompting sending her to the ED for further evaluation.  ED Course: Upon presentation to the ED vital signs included temperature 95.3 rectally, RR 16, HR 121, BP 114/95, O2 saturations 100% on 2L nasal cannula. Workup revealed WBC 11.7 w/ neutrophilia and mild left shift, procalcitonin 5.38, lactic acid 9.2, potassium 5.5, bicarb 14, anion gap 20, BUN 64, Creatinine 4.48, alkaline phosphatase 176, and glucose 265. Venous blood gas with pH 7.11, pCO2 35, pO2 31, and Bicarb 11.1.  Chest x-ray without acute disease, and Urinalysis is consistent with UTI.  Her COVID-19 PCR and Influenza PCR are both negative.   She met sepsis criteria, therefore she received IV fluid resuscitation and broad spectrum antibiotics (Cefepime, Flagyl, and Vancomycin).  While in the ED she became hypotensive refractory to IV fluids, therefore she was placed on Levophed drip.  PCCM is asked to admit the patient to ICU for further workup and treatment of Septic Shock in the setting of UTI, along with AKI and severe anion gap metabolic acidosis requiring Bicarb drip.  Nephrology has been consulted.    Hospital Course: Given her worsening AKI metabolic derangements, temporary dialysis access was obtained with plan to place on CRRT.  She continued to have severe lactic acidosis, therefore CT Abdomen & Pelvis was obtained, which was negative for acute findings except for possible colitis.  Early in the morning 23-Feb-2020 she had elevation in her troponin up to 441, and EKG with slight 64m ST elevation in the inferior leads and T wave inversion in the lateral leads.  Cardiology was consulted, and did not recommend Heparin at that time.  She continued to have deterioration  in her status later that morning, and suffered cardiac arrest (asystole), likely secondary to severe metabolic acidosis and sepsis.  She  received multiple rounds of Epi and bicarb, along with Calcium gluconate and 300 mg Amiodarone.  ROSC obtained.  Family came to bedside and elected to withdraw care and transition to comfort measures only.       Pertinent Labs and Studies  Significant Diagnostic Studies CT ABDOMEN PELVIS WO CONTRAST  Result Date: 01/20/2020 CLINICAL DATA:  Sepsis EXAM: CT ABDOMEN AND PELVIS WITHOUT CONTRAST TECHNIQUE: Multidetector CT imaging of the abdomen and pelvis was performed following the standard protocol without IV contrast. COMPARISON:  Ultrasound studies same day. CT 01/17/2020, 08/19/19. FINDINGS: Lower chest: Lung bases are currently clear. There is some chronic pleural thickening on the left with somewhat nodular configuration. This could be ordinary pleural scarring, but pleural masses are not excluded. Hepatobiliary: Mild fatty change of the liver. Numerous gallstones dependent in the gallbladder. Gallbladder sludge. No CT evidence of cholecystitis or obstruction. Pancreas: Normal Spleen: Normal Adrenals/Urinary Tract: Adrenal glands are normal. Kidneys are normal. Bladder is normal. Stomach/Bowel: Stomach and small intestine are normal. Moderate amount of fecal matter in the colon. There could be a degree of fecal impaction. Question if there could be mild colitis of the distal colon related to impaction. Vascular/Lymphatic: Aortic atherosclerosis. No aneurysm. IVC is normal. No retroperitoneal adenopathy. Reproductive: Normal.  No pelvic mass. Other: Small amount of free fluid without loculation/abscess. No free air. No hernia. Musculoskeletal: Ordinary spinal degenerative changes. Chronic pars defects at L5. IMPRESSION: 1. Moderate amount of fecal matter in the colon. There could be a degree of fecal impaction. Question mild colitis of the distal colon, possibly secondary to impaction. 2. Small amount of free fluid in the pelvis without loculation/abscess. 3. Chronic pleural thickening on the left  with somewhat nodular configuration. This could be ordinary pleural scarring, but pleural masses are not excluded. Suspicion is low but consider follow-up the patient has a history of malignancy. 4. Aortic atherosclerosis. 5. Mild fatty change of the liver. Multiple gallstones without CT evidence of cholecystitis or obstruction. 6. Chronic pars defects at L5. Aortic Atherosclerosis (ICD10-I70.0). Electronically Signed   By: Nelson Chimes M.D.   On: 01/15/2020 14:50   CT ABDOMEN PELVIS WO CONTRAST  Result Date: 01/17/2020 CLINICAL DATA:  Abdominal pain, acute, nonlocalized. Diarrhea, generalized malaise, labs indicating worsening renal function. EXAM: CT ABDOMEN AND PELVIS WITHOUT CONTRAST TECHNIQUE: Multidetector CT imaging of the abdomen and pelvis was performed following the standard protocol without IV contrast. COMPARISON:  CT abdomen pelvis 08/17/2019 FINDINGS: Lower chest: Hyperdense myocardium compared to the cardiac chambers likely representative of anemia. Coronary artery calcifications. Possible trace mitral annular calcification. Persistent trace left pleural effusion. Otherwise no acute abnormality. Hepatobiliary: The hepatic parenchyma is diffusely hypodense compared to the splenic parenchyma consistent with fatty infiltration. No focal liver abnormality. Calcified gallstones within the gallbladder lumen. Otherwise no gallbladder wall thickening or pericholecystic fluid. No biliary dilatation. Pancreas: No focal lesion. Normal pancreatic contour. No surrounding inflammatory changes. No main pancreatic ductal dilatation. Spleen: Normal in size without focal abnormality. Adrenals/Urinary Tract: No adrenal nodule bilaterally. Bilateral renal cortical scarring. No nephrolithiasis, no hydronephrosis, and no contour-deforming renal mass. No ureterolithiasis or hydroureter. The urinary bladder is distended with urine. There is mild urinary bladder wall thickening and trace perivesicular fat stranding. A  Foley catheter is noted to terminate within the lumen of the urinary bladder. Stomach/Bowel: PO contrast reaches the distal transverse colon. Stomach is within  normal limits. No evidence of bowel wall thickening or dilatation. Appendix appears normal. Vascular/Lymphatic: No abdominal aorta or iliac aneurysm. Mild atherosclerotic plaque of the aorta and its branches. No abdominal, pelvic, or inguinal lymphadenopathy. Redemonstration of hepatoduodenal nodal calcifications. Reproductive: Uterus and bilateral adnexa are unremarkable. Other: No intraperitoneal free fluid. No intraperitoneal free gas. No organized fluid collection. Musculoskeletal: No abdominal wall hernia or abnormality. No suspicious lytic or blastic osseous lesions. Slightly heterogeneous osseous structures could be due to chronic renal disease. No acute displaced fracture. 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 Multilevel degenerative changes of the spine. IMPRESSION: 1. Foley catheter iwithin an incompletely decompressed urinary bladder. Mild urinary bladder wall thickening and trace perivesicular fat stranding could represent infection. Correlate with urinalysis. 2. Cholelithiasis with no acute cholecystitis. 3. Hepatic steatosis. 4. Stable trace left pleural effusion. 5.  Aortic Atherosclerosis (ICD10-I70.0). Electronically Signed   By: Iven Finn M.D.   On: 01/17/2020 20:59   DG Abd 1 View  Result Date: 02-Mar-2020 CLINICAL DATA:  NG tube placement EXAM: ABDOMEN - 1 VIEW COMPARISON:  None. FINDINGS: Nasogastric tube tip and side port are below the diaphragm, projecting within the stomach. IMPRESSION: Nasogastric tube tip and side port in the stomach. Electronically Signed   By: Ulyses Jarred M.D.   On: 03/02/2020 03:25   US RENAL  Result Date: 01/20/2020 CLINICAL DATA:  AKI EXAM: RENAL / URINARY TRACT ULTRASOUND COMPLETE COMPARISON:  01/17/2020 and prior. FINDINGS: Right Kidney: Renal  measurements: 9.3 x 4.4 x 5.2 cm = volume: 110 mL. Increased parenchymal echogenicity. No mass or hydronephrosis visualized. Left Kidney: Renal measurements: 10.2 x 3.9 x 4.1 cm = volume: 85 mL. Increased parenchymal echogenicity. No mass or hydronephrosis visualized. Bladder: Appears normal for degree of bladder distention. Other: None. IMPRESSION: Echogenic renal parenchyma may reflect sequela of medical renal disease. Electronically Signed   By: Primitivo Gauze M.D.   On: 01/28/2020 12:43   DG Chest Port 1 View  Result Date: 02-Mar-2020 CLINICAL DATA:  Intubation. EXAM: PORTABLE CHEST 1 VIEW COMPARISON:  02/03/2020 FINDINGS: Interval placement of endotracheal tube, approximately 7 mm below the carina directed toward the right main bronchus. Recommend withdrawal 3 cm. Right jugular central venous catheter tip at the cavoatrial junction unchanged. NG tube enters the stomach with the tip not visualized. Cardiac enlargement. Mild vascular congestion without edema or effusion. Mild left lower lobe atelectasis. IMPRESSION: Endotracheal tube 7 mm above the carina.  Recommend withdrawal 3 cm. Mild left lower lobe atelectasis unchanged. Electronically Signed   By: Franchot Gallo M.D.   On: 03-02-2020 08:50   DG Chest Port 1 View  Result Date: 02/01/2020 CLINICAL DATA:  Central line placement EXAM: PORTABLE CHEST 1 VIEW COMPARISON:  01/31/2020 FINDINGS: Single frontal view of the chest demonstrates right internal jugular catheter tip overlying superior vena cava. Cardiac silhouette is stable. No airspace disease, effusion, or pneumothorax. IMPRESSION: 1. No complication after right internal jugular catheter placement. Electronically Signed   By: Randa Ngo M.D.   On: 01/21/2020 18:41   DG Chest Port 1 View  Result Date: 01/29/2020 CLINICAL DATA:  Encephalopathy EXAM: PORTABLE CHEST 1 VIEW COMPARISON:  01/25/2020 FINDINGS: The heart size and mediastinal contours are within normal limits. Both lungs are  clear. The visualized skeletal structures are unremarkable. IMPRESSION: No active disease. Electronically Signed   By: Ulyses Jarred M.D.   On: 01/20/2020 03:50   DG Chest Port 1 View  Result Date: 01/25/2020 CLINICAL  DATA:  Bright red rectal bleeding. EXAM: PORTABLE CHEST 1 VIEW COMPARISON:  January 17, 2020 FINDINGS: The heart size and mediastinal contours are within normal limits. Both lungs are clear. Degenerative changes are seen involving the bilateral shoulders and thoracic spine. IMPRESSION: No active disease. Electronically Signed   By: Virgina Norfolk M.D.   On: 01/25/2020 03:17   DG Chest Port 1 View  Result Date: 01/17/2020 CLINICAL DATA:  Weakness EXAM: PORTABLE CHEST 1 VIEW COMPARISON:  None. FINDINGS: The heart size and mediastinal contours are within normal limits. Both lungs are clear. The visualized skeletal structures are unremarkable. IMPRESSION: No active disease. Electronically Signed   By: Prudencio Pair M.D.   On: 01/17/2020 00:42   US Abdomen Limited RUQ (LIVER/GB)  Result Date: 01/15/2020 CLINICAL DATA:  Elevated LFTs. EXAM: ULTRASOUND ABDOMEN LIMITED RIGHT UPPER QUADRANT COMPARISON:  01/17/2020 and prior. FINDINGS: Gallbladder: No wall thickening visualized. Intraluminal calculi measuring up to 5 mm. 2.0 cm focus of tumefactive sludge. Sonographic Percell Miller sign could not be obtained secondary to patient disposition. Common bile duct: Diameter: 6.8 mm Liver: No focal lesion identified. Increased parenchymal echogenicity. Portal vein is patent on color Doppler imaging with normal direction of blood flow towards the liver. Other: None. IMPRESSION: Gallbladder sludge and calculi.  No evidence of cholecystitis. Hepatic steatosis. Electronically Signed   By: Primitivo Gauze M.D.   On: 01/11/2020 12:38    Microbiology Recent Results (from the past 240 hour(s))  Blood Culture (routine x 2)     Status: None (Preliminary result)   Collection Time: 01/12/2020  2:52 AM    Specimen: BLOOD  Result Value Ref Range Status   Specimen Description BLOOD LEFT ASSIST CONTROL  Final   Special Requests   Final    BOTTLES DRAWN AEROBIC AND ANAEROBIC Blood Culture adequate volume   Culture  Setup Time   Final    Organism ID to follow GRAM POSITIVE COCCI ANAEROBIC BOTTLE ONLY GRAM NEGATIVE RODS AEROBIC BOTTLE ONLY CRITICAL RESULT CALLED TO, READ BACK BY AND VERIFIED WITH: BEN BEERS 01/25/2020 [place] Performed at [facility], [address] [place], [place] [address]    Culture GRAM POSITIVE COCCI GRAM NEGATIVE RODS   Final   Report Status PENDING  Incomplete  Urine culture     Status: Abnormal (Preliminary result)   Collection Time: 01/09/2020  2:52 AM   Specimen: Urine, Random  Result Value Ref Range Status   Specimen Description   Final    URINE, RANDOM Performed at [facility], [address], [place], [place] [address]    Special Requests   Final    NONE Performed at [facility], [address] [place], [place] [address]    Culture (A)  Final    >=100,000 COLONIES/mL CITROBACTER BRAAKII 30,000 COLONIES/mL ENTEROCOCCUS FAECIUM SUSCEPTIBILITIES TO FOLLOW Performed at [facility], [place] [address] [place], [place] [address]    Report Status PENDING  Incomplete  Resp Panel by RT-PCR (Flu A&B, Covid)     Status: None   Collection Time: 02/06/2020  2:52 AM   Specimen: Nasopharyngeal(NP) swabs in vial transport medium  Result Value Ref Range Status   SARS Coronavirus 2 by RT PCR NEGATIVE NEGATIVE Final    Comment: (NOTE) SARS-CoV-2 target nucleic acids are NOT DETECTED.  The SARS-CoV-2 RNA is generally detectable in upper respiratory specimens during the acute phase of infection. The lowest concentration of SARS-CoV-2 viral copies this assay can detect is 138 copies/mL. A negative  result does not preclude SARS-Cov-2 infection and should not be used as the sole basis for treatment or other patient management  decisions. A negative result may occur with  improper specimen collection/handling, submission of specimen other than nasopharyngeal swab, presence of viral mutation(s) within the areas targeted by this assay, and inadequate number of viral copies(<138 copies/mL). A negative result must be combined with clinical observations, patient history, and epidemiological information. The expected result is Negative.  Fact Sheet for Patients:  EntrepreneurPulse.com.au  Fact Sheet for Healthcare Providers:  IncredibleEmployment.be  This test is no t yet approved or cleared by the [place] FDA and  has been authorized for detection and/or diagnosis of SARS-CoV-2 by FDA under an Emergency Use Authorization (EUA). This EUA will remain  in effect (meaning this test can be used) for the duration of the COVID-19 declaration under Section 564(b)(1) of the Act, 21 U.S.C.section 360bbb-3(b)(1), unless the authorization is terminated  or revoked sooner.       Influenza A by PCR NEGATIVE NEGATIVE Final   Influenza B by PCR NEGATIVE NEGATIVE Final    Comment: (NOTE) The Xpert Xpress SARS-CoV-2/FLU/RSV plus assay is intended as an aid in the diagnosis of influenza from Nasopharyngeal swab specimens and should not be used as a sole basis for treatment. Nasal washings and aspirates are unacceptable for Xpert Xpress SARS-CoV-2/FLU/RSV testing.  Fact Sheet for Patients: EntrepreneurPulse.com.au  Fact Sheet for Healthcare Providers: IncredibleEmployment.be  This test is not yet approved or cleared by the [place] FDA and has been authorized for detection and/or diagnosis of SARS-CoV-2 by FDA under an Emergency Use Authorization (EUA). This EUA will remain in effect (meaning this test can be used) for the duration of the COVID-19 declaration under Section 564(b)(1) of the Act, 21 U.S.C. section 360bbb-3(b)(1), unless the  authorization is terminated or revoked.  Performed at [facility], [address] [place],  [address]   Blood Culture ID Panel (Reflexed)     Status: Abnormal   Collection Time: 01/16/2020  2:52 AM  Result Value Ref Range Status   Enterococcus faecalis NOT DETECTED NOT DETECTED Final   Enterococcus Faecium NOT DETECTED NOT DETECTED Final   Listeria monocytogenes NOT DETECTED NOT DETECTED Final   Staphylococcus species NOT DETECTED NOT DETECTED Final   Staphylococcus aureus (BCID) NOT DETECTED NOT DETECTED Final   Staphylococcus epidermidis NOT DETECTED NOT DETECTED Final   Staphylococcus lugdunensis NOT DETECTED NOT DETECTED Final   Streptococcus species NOT DETECTED NOT DETECTED Final   Streptococcus agalactiae NOT DETECTED NOT DETECTED Final   Streptococcus pneumoniae NOT DETECTED NOT DETECTED Final   Streptococcus pyogenes NOT DETECTED NOT DETECTED Final   A.calcoaceticus-baumannii NOT DETECTED NOT DETECTED Final   Bacteroides fragilis NOT DETECTED NOT DETECTED Final   Enterobacterales DETECTED (A) NOT DETECTED Final    Comment: Enterobacterales represent a large order of gram negative bacteria, not a single organism. Refer to culture for further identification. CRITICAL RESULT CALLED TO, READ BACK BY AND VERIFIED WITH: BEN BEERS 01/20/2020 1725 [place]LW    Enterobacter cloacae complex NOT DETECTED NOT DETECTED Final   Escherichia coli NOT DETECTED NOT DETECTED Final   Klebsiella aerogenes NOT DETECTED NOT DETECTED Final   Klebsiella oxytoca NOT DETECTED NOT DETECTED Final   Klebsiella pneumoniae NOT DETECTED NOT DETECTED Final   Proteus species NOT DETECTED NOT DETECTED Final   Salmonella species NOT DETECTED NOT DETECTED Final   Serratia marcescens NOT DETECTED NOT DETECTED Final   Haemophilus influenzae NOT DETECTED NOT DETECTED  Final   Neisseria meningitidis NOT DETECTED NOT DETECTED Final   Pseudomonas aeruginosa NOT DETECTED NOT DETECTED Final    Stenotrophomonas maltophilia NOT DETECTED NOT DETECTED Final   Candida albicans NOT DETECTED NOT DETECTED Final   Candida auris NOT DETECTED NOT DETECTED Final   Candida glabrata NOT DETECTED NOT DETECTED Final   Candida krusei NOT DETECTED NOT DETECTED Final   Candida parapsilosis NOT DETECTED NOT DETECTED Final   Candida tropicalis NOT DETECTED NOT DETECTED Final   Cryptococcus neoformans/gattii NOT DETECTED NOT DETECTED Final   CTX-M ESBL NOT DETECTED NOT DETECTED Final   Carbapenem resistance IMP NOT DETECTED NOT DETECTED Final   Carbapenem resistance KPC NOT DETECTED NOT DETECTED Final   Carbapenem resistance NDM NOT DETECTED NOT DETECTED Final   Carbapenem resist OXA 48 LIKE NOT DETECTED NOT DETECTED Final   Carbapenem resistance VIM NOT DETECTED NOT DETECTED Final    Comment: Performed at [facility], [address] [place], [place] [address]  Blood Culture (routine x 2)     Status: None (Preliminary result)   Collection Time: 01/18/2020  2:53 AM   Specimen: BLOOD  Result Value Ref Range Status   Specimen Description BLOOD BLOOD LEFT WRIST  Final   Special Requests   Final    BOTTLES DRAWN AEROBIC AND ANAEROBIC Blood Culture results may not be optimal due to an inadequate volume of blood received in culture bottles   Culture  Setup Time   Final    GRAM POSITIVE COCCI AEROBIC BOTTLE ONLY Organism ID to follow CRITICAL RESULT CALLED TO, READ BACK BY AND VERIFIED WITH: BEN BEERS 01/13/2020 1725 KLW Performed at [facility], [address] [place], [place] [address]    Culture GRAM POSITIVE COCCI  Final   Report Status PENDING  Incomplete  Blood Culture ID Panel (Reflexed)     Status: Abnormal   Collection Time: 01/19/2020  2:53 AM  Result Value Ref Range Status   Enterococcus faecalis DETECTED (A) NOT DETECTED Final    Comment: CRITICAL RESULT CALLED TO, READ BACK BY AND VERIFIED WITH: BEN BEERS 01/26/2020 1725 KLW    Enterococcus Faecium NOT DETECTED NOT  DETECTED Final   Listeria monocytogenes NOT DETECTED NOT DETECTED Final   Staphylococcus species NOT DETECTED NOT DETECTED Final   Staphylococcus aureus (BCID) NOT DETECTED NOT DETECTED Final   Staphylococcus epidermidis NOT DETECTED NOT DETECTED Final   Staphylococcus lugdunensis NOT DETECTED NOT DETECTED Final   Streptococcus species NOT DETECTED NOT DETECTED Final   Streptococcus agalactiae NOT DETECTED NOT DETECTED Final   Streptococcus pneumoniae NOT DETECTED NOT DETECTED Final   Streptococcus pyogenes NOT DETECTED NOT DETECTED Final   A.calcoaceticus-baumannii NOT DETECTED NOT DETECTED Final   Bacteroides fragilis NOT DETECTED NOT DETECTED Final   Enterobacterales NOT DETECTED NOT DETECTED Final   Enterobacter cloacae complex NOT DETECTED NOT DETECTED Final   Escherichia coli NOT DETECTED NOT DETECTED Final   Klebsiella aerogenes NOT DETECTED NOT DETECTED Final   Klebsiella oxytoca NOT DETECTED NOT DETECTED Final   Klebsiella pneumoniae NOT DETECTED NOT DETECTED Final   Proteus species NOT DETECTED NOT DETECTED Final   Salmonella species NOT DETECTED NOT DETECTED Final   Serratia marcescens NOT DETECTED NOT DETECTED Final   Haemophilus influenzae NOT DETECTED NOT DETECTED Final   Neisseria meningitidis NOT DETECTED NOT DETECTED Final   Pseudomonas aeruginosa NOT DETECTED NOT DETECTED Final   Stenotrophomonas maltophilia NOT DETECTED NOT DETECTED Final   Candida albicans NOT DETECTED NOT DETECTED Final  Candida auris NOT DETECTED NOT DETECTED Final   Candida glabrata NOT DETECTED NOT DETECTED Final   Candida krusei NOT DETECTED NOT DETECTED Final   Candida parapsilosis NOT DETECTED NOT DETECTED Final   Candida tropicalis NOT DETECTED NOT DETECTED Final   Cryptococcus neoformans/gattii NOT DETECTED NOT DETECTED Final   Vancomycin resistance NOT DETECTED NOT DETECTED Final    Comment: Performed at [facility], [address] [place],  [address]    Lab Basic  Metabolic Panel: Recent Labs  Lab 01/19/2020 0252 01/28/2020 1201 2020-02-23 0326 02-23-2020 0920  NA 138 137 137 137  K 5.5* 4.5 5.5* 5.2*  CL 104 102 102 99  CO2 14* 14* 10* 9*  GLUCOSE 265* 232* 130* 270*  BUN 64* 60* 56* 59*  CREATININE 4.48* 4.44* 4.17* 4.38*  CALCIUM 9.3 8.5* 8.1* 10.6*  MG 2.4  --   --   --   PHOS  --  4.4  --   --    Liver Function Tests: Recent Labs  Lab 01/29/2020 0252 01/13/2020 1201 Feb 23, 2020 0326 02-23-20 0920  AST 41  --  71* 83*  ALT 17  --  23 21  ALKPHOS 176*  --  219* 204*  BILITOT 0.8  --  1.0 1.0  PROT 6.9  --  5.5* 4.3*  ALBUMIN 1.9* 1.6* 1.4* 1.1*   No results for input(s): LIPASE, AMYLASE in the last 168 hours. No results for input(s): AMMONIA in the last 168 hours. CBC: Recent Labs  Lab 01/28/2020 0252 Feb 23, 2020 0326 Feb 23, 2020 0920  WBC 11.7* 15.7* 15.1*  NEUTROABS 9.4*  --  10.7*  HGB 9.6* 9.1* 7.3*  HCT 32.5* 29.4* 25.4*  MCV 105.9* 104.6* 112.4*  PLT 323 341 290   Cardiac Enzymes: No results for input(s): CKTOTAL, CKMB, CKMBINDEX, TROPONINI in the last 168 hours. Sepsis Labs: Recent Labs  Lab 01/20/2020 0252 01/10/2020 0410 01/20/2020 0881 01/13/2020 1201 01/16/2020 2219 02/23/20 0326 February 23, 2020 0920  PROCALCITON 5.38  --   --   --   --  6.93 6.25  WBC 11.7*  --   --   --   --  15.7* 15.1*  LATICACIDVEN 9.2*   < > 10.5* 9.6* 7.9*  --  >11.0*   < > = values in this interval not displayed.    Procedures/Operations  12/28: Left Femoral CVC placed 12/28: Left femoral A-line placed 12/28: Right IJ Trialysis catheter inserted 12/29: Endotracheal intubation (performed during cardiac arrest)        Darel Hong, [facility] Pager: [phone]  Bradly Bienenstock 2020/02/23, 1:10 PM

## 2020-02-08 NOTE — Progress Notes (Signed)
   02/24/20 1015  Clinical Encounter Type  Visited With Family;Patient  Visit Type Follow-up;Critical Care  Referral From Nurse  Consult/Referral To Chaplain  Family decided to make Pt comfort care. NT told chaplain Pt's son may benefit from a visit and chaplain went in the room to support Pt's son. Shortly thereafter, the Pt's sister and niece (who is a Theme park manager) came. Chaplain was going to leave and the niece told her that her (chaplain) presence was welcomed and she didn't need to leave. Pt's sister was crying and chaplain hugged her and quietly prayed for her. Pt's niece explained that she and her mother were Pt's caregivers before she went to a assistant living facility. She also explained Pt's son lives in [place]. Later niece explained they decided to make Pt comfort care, but would wait for the remaining siblings, before taking her off the ventilator. Chaplain explain she would be available, but wanted to give them time alone with Pt. Niece said ok

## 2020-02-08 NOTE — Progress Notes (Signed)
*  PRELIMINARY RESULTS* Echocardiogram 2D Echocardiogram has been performed.  Gabrielle [name] March 05, 2020, 6:58 AM

## 2020-02-08 NOTE — Progress Notes (Signed)
   02-09-20 0805  Clinical Encounter Type  Visited With Patient;Health care provider  Visit Type Initial;Code  Referral From Nurse  Consult/Referral To Chaplain  Chaplain responded to a CODE BLUE. When she arrived at the room, staff was working on Pt, therefore she stood outside the room silently prayer. NP, Darlyn Chamber called the family to come in. Chaplain told NP that she will stay close by, but if not near, he could page her.

## 2020-02-08 NOTE — Progress Notes (Signed)
Pt's tropoin has incrased to 441 ng/L at 2219, then to 576 ng/L at 0326. Pt has also become more tachycardic throughout the shift, going from 110s to 130s, BP has remained soft, but stable, with a goal of systolic >16. She has Vasopressin going at 0.03 units/min and norepinephrine going at 40 mcg/min (maximum dosage). Respirations remain at 22, and O2 stats are 93% on [place] with 2L.  MD made aware of pt's HR and BP, new orders placed for neosynephrine, and NG tube was placed last night.

## 2020-02-08 DEATH — deceased

## 2022-04-06 IMAGING — CT CT ABD-PELV W/O CM
2 of 4 series · 16 of 46 positions shown, 18 images · non-contrast
Comparison: CT abdomen pelvis 08/17/2019

CLINICAL DATA: Abdominal pain, acute, nonlocalized. Diarrhea,
generalized malaise, labs indicating worsening renal function.

EXAM:
CT ABDOMEN AND PELVIS WITHOUT CONTRAST
TECHNIQUE: Multidetector CT imaging of the abdomen and pelvis was performed
following the standard protocol without IV contrast.

[Series 2: routine abd/pel wo · axial · 0.93mm/px · z∈[-662,-232]mm · 13 of 94 slices shown, 15 images]
[im 4/94  soft-tissue]
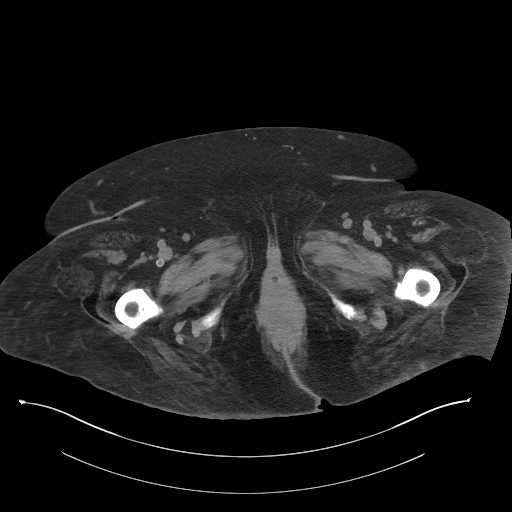
[im 4/94  bone]
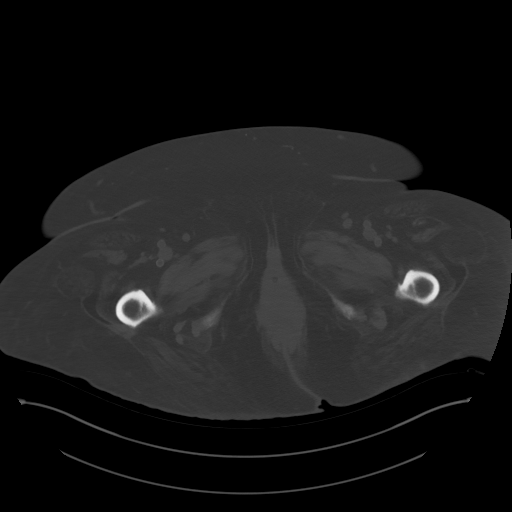
[im 12/94  soft-tissue]
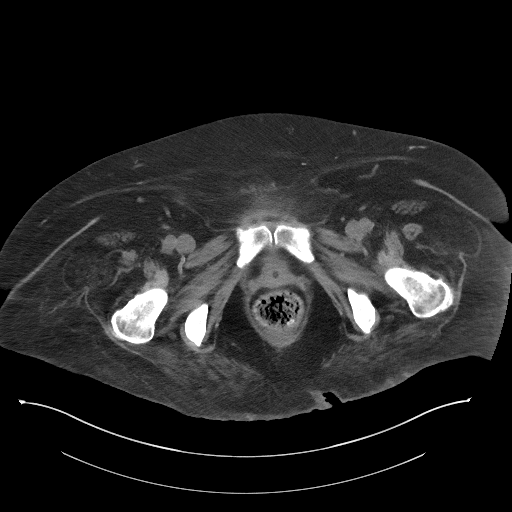
[im 20/94  soft-tissue]
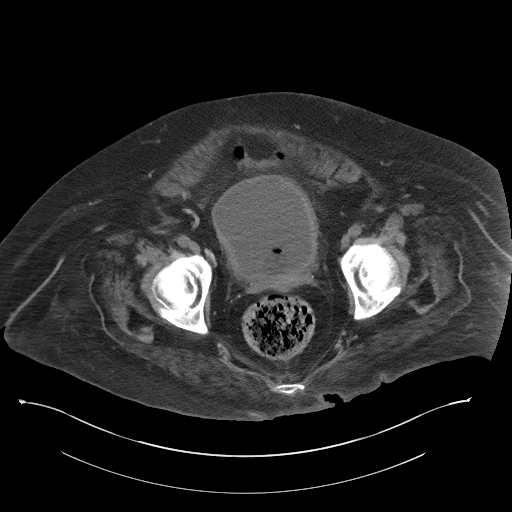
[im 28/94  soft-tissue]
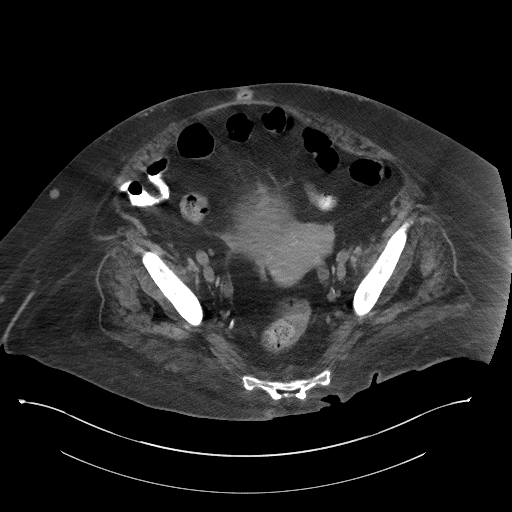
[im 32/94  soft-tissue]
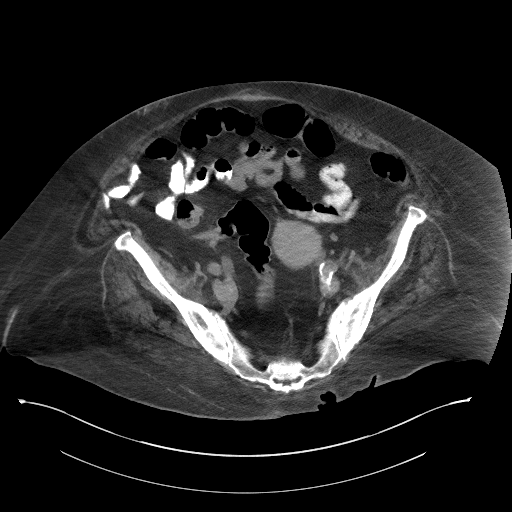
[im 39/94  soft-tissue]
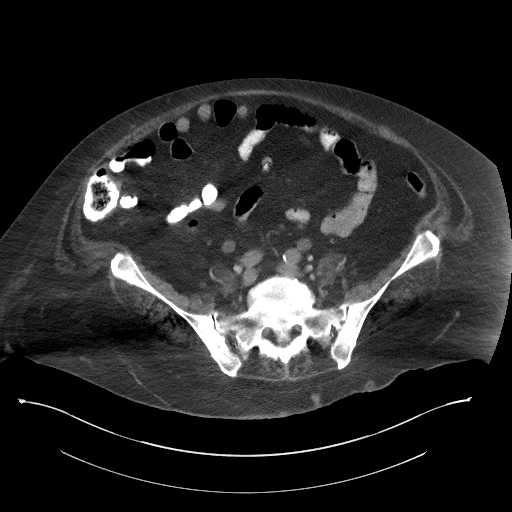
[im 47/94  soft-tissue]
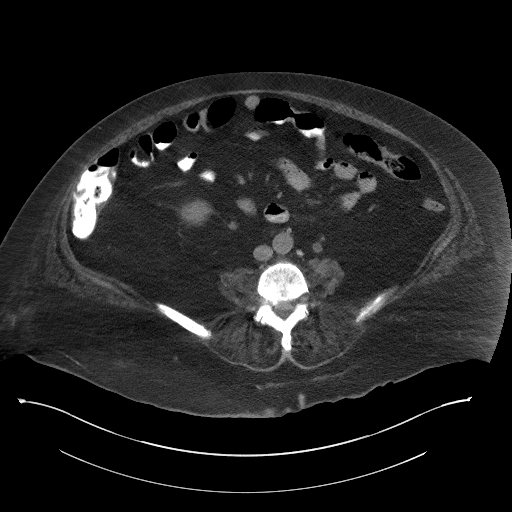
[im 55/94  soft-tissue]
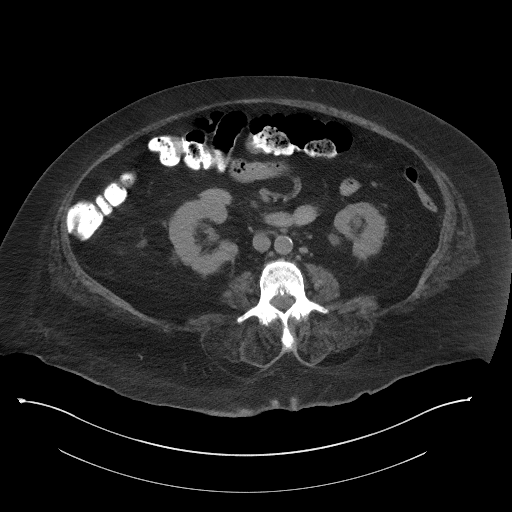
[im 63/94  soft-tissue]
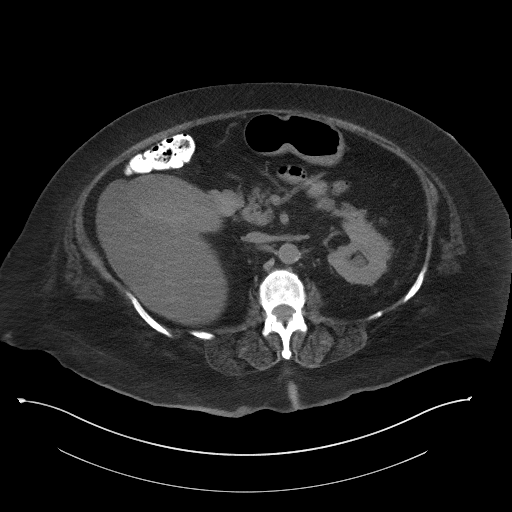
[im 63/94  bone]
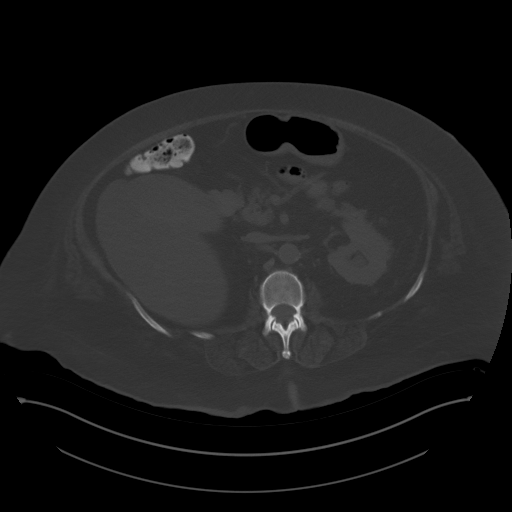
[im 66/94  soft-tissue]
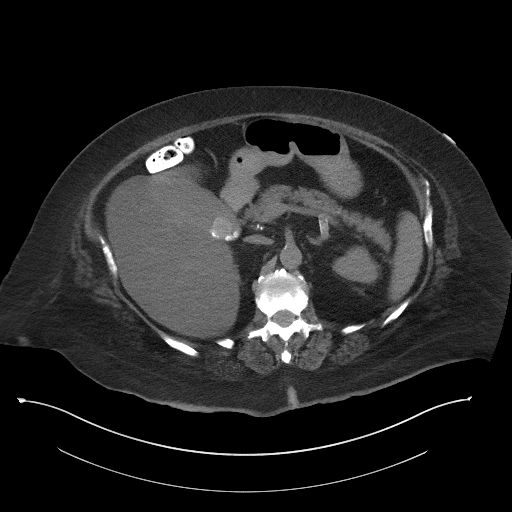
[im 74/94  soft-tissue]
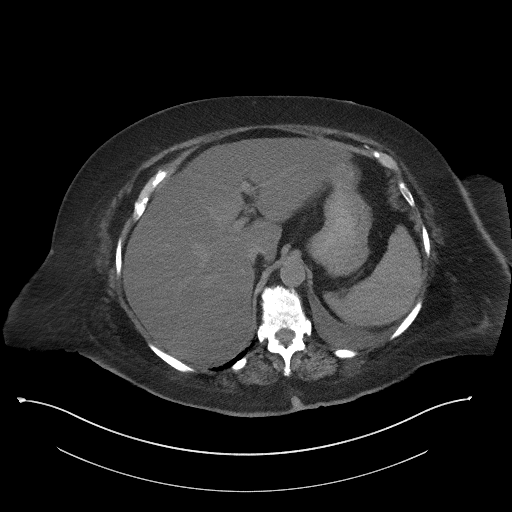
[im 82/94  soft-tissue]
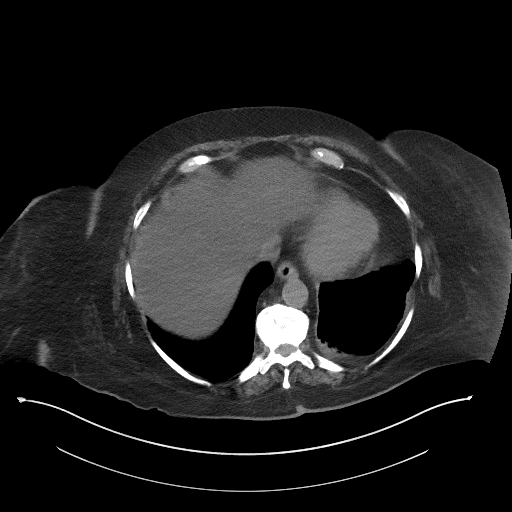
[im 90/94  soft-tissue]
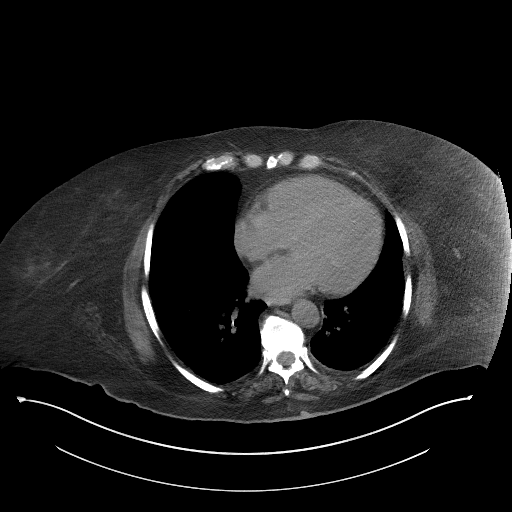

[Series 5: coronal st · coronal · 0.93mm/px · 3 of 109 slices shown]
[im 37/109  soft-tissue]
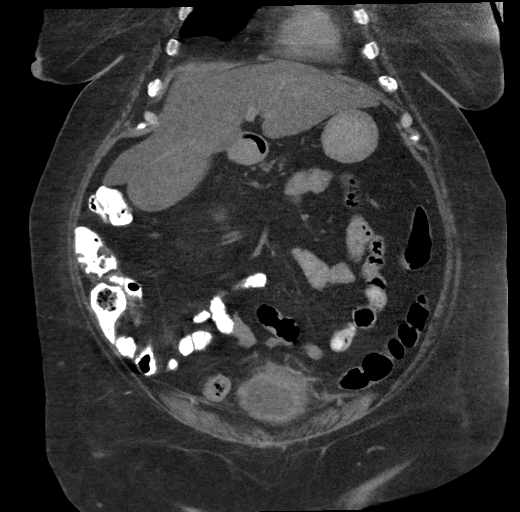
[im 49/109  soft-tissue]
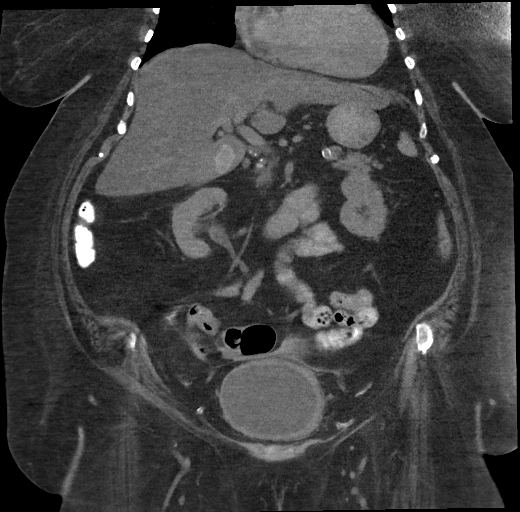
[im 61/109  soft-tissue]
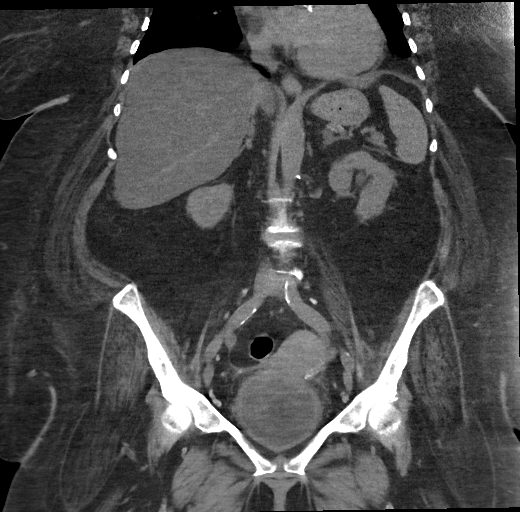

[16 of 46 positions shown; findings below may reference images not displayed]

FINDINGS: Lower chest: Hyperdense myocardium compared to the cardiac chambers
likely representative of anemia. Coronary artery calcifications.
Possible trace mitral annular calcification. Persistent trace left
pleural effusion. Otherwise no acute abnormality.

Hepatobiliary: The hepatic parenchyma is diffusely hypodense
compared to the splenic parenchyma consistent with fatty
infiltration. No focal liver abnormality. Calcified gallstones
within the gallbladder lumen. Otherwise no gallbladder wall
thickening or pericholecystic fluid. No biliary dilatation.

Pancreas: No focal lesion. Normal pancreatic contour. No surrounding
inflammatory changes. No main pancreatic ductal dilatation.

Spleen: Normal in size without focal abnormality.

Adrenals/Urinary Tract: No adrenal nodule bilaterally. Bilateral
renal cortical scarring. No nephrolithiasis, no hydronephrosis, and
no contour-deforming renal mass. No ureterolithiasis or hydroureter.
The urinary bladder is distended with urine. There is mild urinary
bladder wall thickening and trace perivesicular fat stranding. A
Foley catheter is noted to terminate within the lumen of the urinary
bladder.

Stomach/Bowel: PO contrast reaches the distal transverse colon.
Stomach is within normal limits. No evidence of bowel wall
thickening or dilatation. Appendix appears normal.

Vascular/Lymphatic: No abdominal aorta or iliac aneurysm. Mild
atherosclerotic plaque of the aorta and its branches. No abdominal,
pelvic, or inguinal lymphadenopathy. Redemonstration of
hepatoduodenal nodal calcifications.

Reproductive: Uterus and bilateral adnexa are unremarkable.

Other: No intraperitoneal free fluid. No intraperitoneal free gas.
No organized fluid collection.

Musculoskeletal:

No abdominal wall hernia or abnormality.

No suspicious lytic or blastic osseous lesions. Slightly
heterogeneous osseous structures could be due to chronic renal
disease. No acute displaced fracture. Bilateral L5 pars
interarticularis defects with grade 1 anterolisthesis of L5 on S1.
Multilevel degenerative changes of the spine.
IMPRESSION: 1. Foley catheter iwithin an incompletely decompressed urinary
bladder. Mild urinary bladder wall thickening and trace
perivesicular fat stranding could represent infection. Correlate
with urinalysis.
2. Cholelithiasis with no acute cholecystitis.
3. Hepatic steatosis.
4. Stable trace left pleural effusion.
5.  Aortic Atherosclerosis ([id number]).
